# Patient Record
Sex: Male | Born: 1990 | Race: White | Hispanic: No | Marital: Single | Smoking: Current some day smoker
Health system: Southern US, Community
[De-identification: ages and names within clinical notes are randomized; demographics above are authoritative.]

## PROBLEM LIST (undated history)

## (undated) DIAGNOSIS — G894 Chronic pain syndrome: Secondary | ICD-10-CM

## (undated) DIAGNOSIS — F1110 Opioid abuse, uncomplicated: Secondary | ICD-10-CM

## (undated) DIAGNOSIS — M5410 Radiculopathy, site unspecified: Secondary | ICD-10-CM

## (undated) DIAGNOSIS — T148XXA Other injury of unspecified body region, initial encounter: Secondary | ICD-10-CM

## (undated) DIAGNOSIS — S14105A Unspecified injury at C5 level of cervical spinal cord, initial encounter: Secondary | ICD-10-CM

## (undated) HISTORY — DX: Radiculopathy, site unspecified: M54.10

## (undated) HISTORY — DX: Other injury of unspecified body region, initial encounter: T14.8XXA

## (undated) HISTORY — PX: OTHER SURGICAL HISTORY: SHX169

---
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
## 2009-03-14 ENCOUNTER — Emergency Department (HOSPITAL_COMMUNITY): Admission: EM | Admit: 2009-03-14 | Discharge: 2009-03-14 | Payer: Self-pay | Admitting: Emergency Medicine

## 2009-03-27 ENCOUNTER — Emergency Department (HOSPITAL_COMMUNITY): Admission: EM | Admit: 2009-03-27 | Discharge: 2009-03-27 | Payer: Self-pay | Admitting: Emergency Medicine

## 2009-03-28 ENCOUNTER — Ambulatory Visit (HOSPITAL_COMMUNITY): Admission: RE | Admit: 2009-03-28 | Discharge: 2009-03-28 | Payer: Self-pay | Admitting: Emergency Medicine

## 2009-04-28 ENCOUNTER — Emergency Department (HOSPITAL_COMMUNITY): Admission: EM | Admit: 2009-04-28 | Discharge: 2009-04-28 | Payer: Self-pay | Admitting: Emergency Medicine

## 2009-05-03 ENCOUNTER — Emergency Department (HOSPITAL_COMMUNITY): Admission: EM | Admit: 2009-05-03 | Discharge: 2009-05-03 | Payer: Self-pay | Admitting: Emergency Medicine

## 2009-05-05 ENCOUNTER — Emergency Department (HOSPITAL_COMMUNITY): Admission: EM | Admit: 2009-05-05 | Discharge: 2009-05-05 | Payer: Self-pay | Admitting: Emergency Medicine

## 2009-05-09 ENCOUNTER — Emergency Department (HOSPITAL_COMMUNITY): Admission: EM | Admit: 2009-05-09 | Discharge: 2009-05-09 | Payer: Self-pay | Admitting: Emergency Medicine

## 2009-06-05 DIAGNOSIS — S14105A Unspecified injury at C5 level of cervical spinal cord, initial encounter: Secondary | ICD-10-CM

## 2009-06-05 HISTORY — DX: Unspecified injury at C5 level of cervical spinal cord, initial encounter: S14.105A

## 2009-06-06 ENCOUNTER — Emergency Department (HOSPITAL_COMMUNITY): Admission: EM | Admit: 2009-06-06 | Discharge: 2009-06-06 | Payer: Self-pay | Admitting: Emergency Medicine

## 2009-06-12 ENCOUNTER — Emergency Department (HOSPITAL_COMMUNITY): Admission: EM | Admit: 2009-06-12 | Discharge: 2009-06-12 | Payer: Self-pay | Admitting: Emergency Medicine

## 2009-06-18 ENCOUNTER — Emergency Department (HOSPITAL_COMMUNITY): Admission: EM | Admit: 2009-06-18 | Discharge: 2009-06-18 | Payer: Self-pay | Admitting: Emergency Medicine

## 2009-06-19 ENCOUNTER — Emergency Department (HOSPITAL_COMMUNITY): Admission: EM | Admit: 2009-06-19 | Discharge: 2009-06-19 | Payer: Self-pay | Admitting: Emergency Medicine

## 2009-06-24 ENCOUNTER — Emergency Department (HOSPITAL_COMMUNITY): Admission: EM | Admit: 2009-06-24 | Discharge: 2009-06-24 | Payer: Self-pay | Admitting: Emergency Medicine

## 2009-07-25 ENCOUNTER — Emergency Department (HOSPITAL_COMMUNITY): Admission: EM | Admit: 2009-07-25 | Discharge: 2009-07-25 | Payer: Self-pay | Admitting: Emergency Medicine

## 2009-08-23 ENCOUNTER — Ambulatory Visit (HOSPITAL_COMMUNITY): Admission: RE | Admit: 2009-08-23 | Discharge: 2009-08-23 | Payer: Self-pay | Admitting: Neurosurgery

## 2009-09-13 ENCOUNTER — Ambulatory Visit: Payer: Self-pay | Admitting: Family Medicine

## 2009-09-13 DIAGNOSIS — Z87891 Personal history of nicotine dependence: Secondary | ICD-10-CM

## 2009-09-13 DIAGNOSIS — M5412 Radiculopathy, cervical region: Secondary | ICD-10-CM

## 2009-09-21 ENCOUNTER — Telehealth: Payer: Self-pay | Admitting: Sports Medicine

## 2009-09-22 ENCOUNTER — Ambulatory Visit: Payer: Self-pay | Admitting: Family Medicine

## 2009-10-12 ENCOUNTER — Ambulatory Visit: Payer: Self-pay | Admitting: Family Medicine

## 2009-10-12 ENCOUNTER — Encounter: Payer: Self-pay | Admitting: Sports Medicine

## 2009-10-12 DIAGNOSIS — T22019A Burn of unspecified degree of unspecified forearm, initial encounter: Secondary | ICD-10-CM | POA: Insufficient documentation

## 2010-03-09 ENCOUNTER — Encounter: Payer: Self-pay | Admitting: Sports Medicine

## 2010-03-17 ENCOUNTER — Telehealth: Payer: Self-pay | Admitting: Sports Medicine

## 2010-03-30 ENCOUNTER — Telehealth: Payer: Self-pay | Admitting: Sports Medicine

## 2010-05-09 ENCOUNTER — Telehealth: Payer: Self-pay | Admitting: Sports Medicine

## 2010-06-07 ENCOUNTER — Telehealth: Payer: Self-pay | Admitting: Sports Medicine

## 2010-06-26 ENCOUNTER — Encounter: Payer: Self-pay | Admitting: Orthopaedic Surgery

## 2010-07-05 NOTE — Progress Notes (Signed)
Summary: refill  Phone Note Refill Request Call back at Home Phone [phone] Message from:  Patient  would rather have the Tramadol -   Initial call taken by: De Nurse,  March 30, 2010 1:35 PM    New/Updated Medications: TRAMADOL HCL 50 MG TABS (TRAMADOL HCL) One tab by mouth q4-6h as needed for pain Prescriptions: TRAMADOL HCL 50 MG TABS (TRAMADOL HCL) One tab by mouth q4-6h as needed for pain  #60 x 0   Entered and Authorized by:   Rodney Langton MD   Signed by:   Rodney Langton MD on 03/30/2010   Method used:   [facility]        [facility] [address] [phone]* (retail)       [address]       [place], [place]  [address]       Ph: [phone] or [phone]       Fax: [phone]   RxID:   [id number]

## 2010-07-05 NOTE — Assessment & Plan Note (Signed)
Summary: NP/KH   Vital Signs:  Patient profile:   20 year old male Height:      70 inches Weight:      179.1 pounds BMI:     25.79 Temp:     98.1 degrees F oral Pulse rate:   68 / minute Pulse rhythm:   regular BP sitting:   118 / 74  (left arm) Cuff size:   regular  Vitals Entered By: Loralee Pacas CMA (September 13, 2009 9:39 AM)  Primary Care Provider:  Rodney Langton MD   History of Present Illness: New pt eval.  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  was in PT/OT, no benefit, now doing ROM exercises at home daily, taking hydrocodone/APAP for pain which is mildly effective.  Has appt in April 2011 with neurosurgeon at [facility] for nerve transplant.    Smoker:  10 cigs a day.  Wants to quit.    Habits & Providers  Alcohol-Tobacco-Diet     Tobacco Status: current     Tobacco Counseling: to quit use of tobacco products     Cigarette Packs/Day: 1.0     Year Started: 2008     Neurosurgeon: Cherrie Distance. Zachery Conch - [facility]  Current Medications (verified): 1)  Qc Calcium 600+d High Potency 600-400 Mg-Unit Tabs (Calcium Carbonate-Vitamin D) .... One Tab By Mouth Bid 2)  Hydrocodone-Acetaminophen 5-325 Mg Tabs (Hydrocodone-Acetaminophen) .... One Tab By Mouth Q4h As Needed For Pain 3)  Gabapentin 300 Mg Caps (Gabapentin) .... One Tab Daily On Day 1, Then One Tab Two Times A Day On Day 2, Then One Tab By Mouth Three Times A Day.  May Increase To Max of 3 Tabs Three Times A Day.  Allergies (verified): No Known Drug Allergies  Past History:  Past Medical History: Right C7/C8 nerve foot avulsion in 2010 playing football, R arm paralyzed and insensate.  (s/p PT/OT) Smoker.  Past Surgical History: Wrist surgery for fx.  Family History: Both parents alive and well.  Social History: Smoker since 2008, 10 sticks a day. No other drugsSmoking Status:  current Packs/Day:  1.0  Review of  Systems  The patient denies anorexia, fever, weight loss, weight gain, vision loss, decreased hearing, hoarseness, chest pain, syncope, dyspnea on exertion, peripheral edema, prolonged cough, headaches, hemoptysis, abdominal pain, melena, hematochezia, severe indigestion/heartburn, suspicious skin lesions, difficulty walking, unusual weight change, and enlarged lymph nodes.    Physical Exam  General:  Well-developed,well-nourished,in no acute distress; alert,appropriate and cooperative throughout examination Head:  Normocephalic and atraumatic without obvious abnormalities. No apparent alopecia or balding. Eyes:  No corneal or conjunctival inflammation noted. EOMI. Perrla.  Ears:  External ear exam shows no significant lesions or deformities.  Nose:  External nasal examination shows no deformity or inflammation. Mouth:  Oral mucosa and oropharynx without lesions or exudates.  Teeth in good repair. Neck:  No deformities, masses, or tenderness noted. Lungs:  Normal respiratory effort, chest expands symmetrically. Lungs are clear to auscultation, no crackles or wheezes. Heart:  Normal rate and regular rhythm. S1 and S2 normal without gallop, murmur, click, rub or other extra sounds. Abdomen:  Bowel sounds positive,abdomen soft and non-tender without masses, organomegaly or hernias noted. Msk:  R arm somewhat atrophied, no biceps reflex, 1+ brachioradialis reflex, no triceps reflex, strength 0/5, no sensation.  Other ext WNL. Pulses:  R and L carotid,radial,femoral,dorsalis pedis and posterior tibial pulses are full and equal bilaterally Neurologic:  Grossly normall with the exception of R arm. 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   Denervation osteopenia:  Will try Ca-Vit D two times a day Nerve pains:  Refilled norco, will start neurontin, pt  educated on how to increase dose. Cont ROM exercises. Stop flexeril  Problem # 2:  SMOKER (ICD-305.1) Assessment: New Cessation advice given, pt will stop before surgery, doesn't want any medication to help.  Complete Medication List: 1)  Qc Calcium 600+d High Potency 600-400 Mg-unit Tabs (Calcium carbonate-vitamin d) .... One tab by mouth bid 2)  Hydrocodone-acetaminophen 5-325 Mg Tabs (Hydrocodone-acetaminophen) .... One tab by mouth q4h as needed for pain 3)  Gabapentin 300 Mg Caps (Gabapentin) .... One tab daily on day 1, then one tab two times a day on day 2, then one tab by mouth three times a day.  may increase to max of 3 tabs three times a day.  Patient Instructions: 1)  Great to meet you today, 2)  STOP SMOKING!! 3)  Stop the flexeril. 4)  Refilled hydrocodone 5)  Take Calcium/vitamin D two times a day. 6)  Start neurontin, see dosing instructions on script. 7)  range of motion exercises daily 8)  Come back to see me after your visit with the neurosurgeon. 9)  -Dr. Karie Schwalbe. Prescriptions: GABAPENTIN 300 MG CAPS (GABAPENTIN) One tab daily on day 1, then one tab two times a day on day 2, then one tab by mouth three times a day.  May increase to max of 3 tabs three times a day.  #180 x 6   Entered and Authorized by:   Rodney Langton MD   Signed by:   Rodney Langton MD on 09/13/2009   Method used:   Print then Give to Patient   RxID:   [id number] HYDROCODONE-ACETAMINOPHEN 5-325 MG TABS (HYDROCODONE-ACETAMINOPHEN) One tab by mouth q4h as needed for pain  #40 x 0   Entered and Authorized by:   Rodney Langton MD   Signed by:   Rodney Langton MD on 09/13/2009   Method used:   Print then Give to Patient   RxID:   [id number] QC CALCIUM 600+D HIGH POTENCY 600-400 MG-UNIT TABS (CALCIUM CARBONATE-VITAMIN D) One tab by mouth BID  #60 x 6   Entered and Authorized by:   Rodney Langton MD   Signed by:   Rodney Langton MD on 09/13/2009   Method used:    Print then Give to Patient   RxID:   [id number] GABAPENTIN 300 MG CAPS (GABAPENTIN) One tab daily on day 1, then one tab two times a day on day 2, then one tab by mouth three times a day.  May increase to max of 3 tabs three times a day.  #180 x 6   Entered and Authorized by:   Rodney Langton MD   Signed by:   Rodney Langton MD on 09/13/2009   Method used:   Print then Give to Patient   RxID:   [id number] HYDROCODONE-ACETAMINOPHEN 5-325 MG TABS (HYDROCODONE-ACETAMINOPHEN) One tab by mouth q4h as needed for pain  #40 x 0   Entered and Authorized by:   Rodney Langton MD   Signed by:   Rodney Langton MD on 09/13/2009   Method used:   Print then Give to Patient   RxID:   [id number] QC CALCIUM 600+D HIGH POTENCY 600-400 MG-UNIT TABS (CALCIUM CARBONATE-VITAMIN  D) One tab by mouth BID  #60 x 6   Entered and Authorized by:   Rodney Langton MD   Signed by:   Rodney Langton MD on 09/13/2009   Method used:   Print then Give to Patient   RxID:   [id number]  Note, error with script, printed on non-security paper so reprinted on security paper and extra copy destroyed. Rodney Langton MD  September 13, 2009 10:32 AM

## 2010-07-05 NOTE — Assessment & Plan Note (Signed)
Summary: med change/[place]/T   Vital Signs:  Patient profile:   20 year old male Height:      70 inches Weight:      179.8 pounds Temp:     98 degrees F Pulse rate:   81 / minute BP sitting:   113 / 71  (left arm)  Vitals Entered By: Theresia Lo RN (September 22, 2009 8:48 AM) CC: discuss meds Is Patient Diabetic? No Pain Assessment Patient in pain? yes     Location: right arm and right low back Intensity: 8 Type: heaviness   Primary Care Provider:  Rodney Langton MD  CC:  discuss meds.  History of Present Illness: 20 yo here for continued neuropthic pain.  Nerve injury due to accident, saw neurosurgeon yesterday.  Patient schedule for nerve transplant June 8th, 2011.  One week ago was started on gabapentin for nerve pain.  Quickly titrated up to 300 mg three times a day.  Patient does not want to take medication anymore because of somnolence, changes in mood- more irritable, and too many pills.   brings empty hydrocodone-APAP bottle- took all meds and states it did not help much.  Says he has been taking lyrica given to him by neurosurgeon and it seems to be helping.  He is not sure of his doseage.  He has been on this for about a month.  Habits & Providers  Alcohol-Tobacco-Diet     Tobacco Status: current     Tobacco Counseling: to quit use of tobacco products     Cigarette Packs/Day: 0.5  Current Medications (verified): 1)  Qc Calcium 600+d High Potency 600-400 Mg-Unit Tabs (Calcium Carbonate-Vitamin D) .... One Tab By Mouth Bid 2)  Lyrica 100 Mg Caps (Pregabalin) .... Take One Tablet Three Times A Day For Nerve Pain  Allergies: No Known Drug Allergies PMH-FH-SH reviewed-no changes except otherwise noted  Social History: Packs/Day:  0.5  Review of Systems      See HPI MS:  Complains of joint pain and mid back pain. Neuro:  Complains of numbness.  Physical Exam  General:  Well-developed,well-nourished,in no acute distress; alert,appropriate and  cooperative throughout examination. Msk:  right arm in fabric sling.   Impression & Recommendations:  Problem # 1:  INJURY TO CERVICAL NERVE ROOT (ICD-953.0)  Discontinued hydrocodone/apap and gabapentin per patient preference.  WIll increase lyrica to maximum dose of 100 mg three times a day.  Patient scheduled for surgery.  WIll follow-up with PCP after surgery and will have records sent to office.  Follow-up sooner if needed.  Orders: [facility]- Est Level  3 ([address])  Complete Medication List: 1)  Qc Calcium 600+d High Potency 600-400 Mg-unit Tabs (Calcium carbonate-vitamin d) .... One tab by mouth bid 2)  Lyrica 100 Mg Caps (Pregabalin) .... Take one tablet three times a day for nerve pain  Patient Instructions: 1)  Today we will increase your dose of lyrica. 2)  Please make follow-up appt with Dr. Karie Schwalbe after surgery or sooner if needed. 3)  Please ask your neurosurgeon to send records of your surgery to our office. Prescriptions: LYRICA 100 MG CAPS (PREGABALIN) take one tablet three times a day for nerve pain  #90 x 1   Entered and Authorized by:   Delbert Harness MD   Signed by:   Delbert Harness MD on 09/22/2009   Method used:   Print then Give to Patient   RxID:   [id number]

## 2010-07-05 NOTE — Progress Notes (Signed)
Summary: meds  Phone Note Call from Patient Call back at Home Phone [phone]   Caller: Patient Summary of Call: pt states that his surgery is June 8th - neurontin is not working and would like to try something different. [facility] Initial call taken by: De Nurse,  September 21, 2009 1:49 PM  Follow-up for Phone Call        states the med is not helping. told him he will need to see a doctor since his is not going to be here for the next 2 weeks. he will see Dr. Earnest Bailey at 8:30am Wed Follow-up by: Golden Circle RN,  September 21, 2009 1:54 PM  Additional Follow-up for Phone Call Additional follow up Details #1::        Noted, to see dr briscoe. Additional Follow-up by: Rodney Langton MD,  September 21, 2009 9:26 PM     Appended Document: meds Also to Dr. Earnest Bailey, would ensure he is taking neurontin as rx'ed and is up to the max dose daily 3600mg . If not would advise to continue increasing until gets there.  If taking max dose could consider amitryptiline vs lyrica.

## 2010-07-05 NOTE — Progress Notes (Signed)
Summary: refill  Phone Note Refill Request Call back at Work Phone [phone] Message from:  Patient  Refills Requested: Medication #1:  TRAMADOL HCL 50 MG TABS One tab by mouth q4-6h as needed for pain. Initial call taken by: De Nurse,  May 09, 2010 12:29 PM    Prescriptions: TRAMADOL HCL 50 MG TABS (TRAMADOL HCL) One tab by mouth q4-6h as needed for pain  #60 x 3   Entered and Authorized by:   Rodney Langton MD   Signed by:   Rodney Langton MD on 05/09/2010   Method used:   [facility]ically [facility]        [facility] [address] [phone]* (retail)       [address]       [place], [place]  [address]       Ph: [phone] or [phone]       Fax: [phone]   RxID:   [id number]
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
## 2010-07-05 NOTE — Progress Notes (Signed)
Summary: phn msg  Phone Note Call from Patient Call back at Work Phone [phone]   Caller: Patient Summary of Call: Tramadol helps but wants to know if there is a higher dose?  taking too many at a time [facility] Initial call taken by: De Nurse,  March 17, 2010 1:41 PM  Follow-up for Phone Call        I can increase to ultracet, little more expensive but stronger. Follow-up by: Rodney Langton MD,  March 17, 2010 2:34 PM    New/Updated Medications: ULTRACET 37.5-325 MG TABS (TRAMADOL-ACETAMINOPHEN) One to two tabs by mouth q8h as needed for pain. Prescriptions: ULTRACET 37.5-325 MG TABS (TRAMADOL-ACETAMINOPHEN) One to two tabs by mouth q8h as needed for pain.  #60 x 0   Entered and Authorized by:   Rodney Langton MD   Signed by:   Rodney Langton MD on 03/17/2010   Method used:   Electronically to        [facility] [address] [phone]* (retail)       [address]       [place], [place]  [address]       Ph: [phone] or [phone]       Fax: [phone]   RxID:   [id number]  called pt and left VM to let pt know. De Nurse  March 21, 2010 4:34 PM

## 2010-07-07 NOTE — Progress Notes (Signed)
  Phone Note Refill Request   Refills Requested: Medication #1:  TRAMADOL HCL 50 MG TABS One tab by mouth q4-6h as needed for pain. Initial call taken by: Abundio Miu,  June 07, 2010 2:12 PM    Prescriptions: TRAMADOL HCL 50 MG TABS (TRAMADOL HCL) One tab by mouth q4-6h as needed for pain  #60 x 3   Entered and Authorized by:   Rodney Langton MD   Signed by:   Rodney Langton MD on 06/07/2010   Method used:   Electronically to        [facility] [address] [phone]* (retail)       [address]       [place], [place]  [address]       Ph: [phone] or [phone]       Fax: [phone]   RxID:   [id number]

## 2010-07-09 ENCOUNTER — Emergency Department (HOSPITAL_COMMUNITY)
Admission: EM | Admit: 2010-07-09 | Discharge: 2010-07-10 | Disposition: A | Discharge status: Home / Self Care | Payer: Medicaid Other | Attending: Emergency Medicine | Admitting: Emergency Medicine

## 2010-07-09 DIAGNOSIS — W540XXA Bitten by dog, initial encounter: Secondary | ICD-10-CM | POA: Insufficient documentation

## 2010-07-09 DIAGNOSIS — Z203 Contact with and (suspected) exposure to rabies: Secondary | ICD-10-CM | POA: Insufficient documentation

## 2010-07-09 DIAGNOSIS — S51809A Unspecified open wound of unspecified forearm, initial encounter: Secondary | ICD-10-CM | POA: Insufficient documentation

## 2010-07-09 DIAGNOSIS — Z23 Encounter for immunization: Secondary | ICD-10-CM | POA: Insufficient documentation

## 2010-07-12 ENCOUNTER — Encounter: Payer: Self-pay | Admitting: Sports Medicine
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
## 2010-09-05 ENCOUNTER — Ambulatory Visit (INDEPENDENT_AMBULATORY_CARE_PROVIDER_SITE_OTHER): Payer: Medicaid Other | Admitting: Sports Medicine

## 2010-09-05 ENCOUNTER — Encounter: Payer: Self-pay | Admitting: Sports Medicine

## 2010-09-05 VITALS — BP 157/86 | HR 111 | Temp 98.2°F | Ht 71.0 in | Wt 182.5 lb

## 2010-09-05 DIAGNOSIS — S142XXA Injury of nerve root of cervical spine, initial encounter: Secondary | ICD-10-CM

## 2010-09-05 DIAGNOSIS — R030 Elevated blood-pressure reading, without diagnosis of hypertension: Secondary | ICD-10-CM

## 2010-09-05 MED ORDER — OXYCODONE HCL 10 MG PO TB12: 10.0000 mg | ORAL_TABLET | Freq: Two times a day (BID) | ORAL | Status: DC

## 2010-09-05 MED ORDER — OXYCODONE HCL 5 MG PO TABS: 5.0000 mg | ORAL_TABLET | ORAL | Status: DC | PRN

## 2010-09-05 MED ORDER — GABAPENTIN 300 MG PO CAPS: ORAL_CAPSULE | ORAL | Status: DC

## 2010-09-05 NOTE — Patient Instructions (Signed)
Great to see you, Changing your oxycodone to oxycontin 10mg  2x a day. Will add oxycodone 5 to be used only for breakthrough pain. Re-adding gabapentin, use as directed. Come back to see me in a month.  Ihor Austin. Benjamin Stain, M.D.

## 2010-09-05 NOTE — Progress Notes (Signed)
  Subjective:    Patient ID: Jeffery Baldwin, male    DOB: 04/06/1991, 20 y.o.   MRN: [id number]  HPI Pt returns after his nerve root avulsion, s/p nerve root repair.  He is gaining some sensation back but has no movement of his arm.  He still has considerable pain.  Has been going to [facility] but doesn't like it there. Current pain meds include:  Oxycodone 5, 5x a day.  No longer taking gabapentin or lyrica.   Review of Systems    See HPI Objective:   Physical Exam  Constitutional: He appears well-developed and well-nourished. No distress.  Cardiovascular: Normal rate, regular rhythm and normal heart sounds.  Exam reveals no gallop and no friction rub.   No murmur heard. Pulmonary/Chest: Effort normal. No respiratory distress. He has no wheezes. He has no rales. He exhibits no tenderness.  Neurological:       R arm paralyzed. No movement at all. Minimal but non-consistent sensation reported in C5/C6 distribution, completely insensate  C6-T1.  In sling.  Warm, dry.  Skin: Skin is warm and dry.          Assessment & Plan:

## 2010-09-05 NOTE — Progress Notes (Signed)
Addended by: Rodney Langton on: 09/05/2010 04:34 PM   Modules accepted: Orders

## 2010-09-05 NOTE — Assessment & Plan Note (Addendum)
S/p nerve root repair. Still with pain. Oxycodone IR 5mg  5x a day, will change to Oxycontin 10 BID w/breakthrough oxycodone. Adding Gabapentin TID. RTC 1 month to reassess.  Marland Kitchen...pt unable to afford oxycontin, will change to oxy IR, #120 tabs.  He got N/V to [facility].  Will have him put in for prior auth, which I can fill out.

## 2010-09-05 NOTE — Assessment & Plan Note (Signed)
Noted that he was in considerable pain today. Will recheck and if cont to be high would need to treat.

## 2010-09-06 ENCOUNTER — Telehealth: Payer: Self-pay | Admitting: Sports Medicine

## 2010-09-06 NOTE — Telephone Encounter (Signed)
Pt is leaving town today and states that the pharmacy could not fill script for Oxy 5mg , they said they would be able to fill the 10mg  - needs another script for 10mg  but for lesser amount (#30)  Not sure what to do since Dr T is not here today.

## 2010-09-06 NOTE — Telephone Encounter (Signed)
LM. If he calls, tell him to bring old RX before md will write a new one

## 2010-09-06 NOTE — Telephone Encounter (Signed)
He can come by and [facility] HIS RX for the 5 mg and I will changeit accordingly THANKS! Denny Levy

## 2010-09-06 NOTE — Telephone Encounter (Signed)
Sent to preceptor to advise

## 2010-09-07 ENCOUNTER — Other Ambulatory Visit: Payer: Self-pay | Admitting: Family Medicine

## 2010-09-07 MED ORDER — OXYCODONE HCL 10 MG PO TABS: 10.0000 mg | ORAL_TABLET | Freq: Two times a day (BID) | ORAL | Status: DC | PRN

## 2010-09-07 NOTE — Telephone Encounter (Signed)
Addended byDenny Levy on: 09/07/2010 03:53 PM   Modules accepted: Orders

## 2010-09-07 NOTE — Telephone Encounter (Signed)
I have his original rx and will destroy New rx written for 10 mg tabs, one po bid #60 no refills. F/u Dr Karie Schwalbe

## 2010-10-07 ENCOUNTER — Encounter: Payer: Self-pay | Admitting: Sports Medicine

## 2010-10-07 ENCOUNTER — Ambulatory Visit (INDEPENDENT_AMBULATORY_CARE_PROVIDER_SITE_OTHER): Payer: Medicaid Other | Admitting: Sports Medicine

## 2010-10-07 VITALS — BP 132/78 | HR 82 | Temp 98.1°F | Ht 70.0 in | Wt 183.4 lb

## 2010-10-07 DIAGNOSIS — M5410 Radiculopathy, site unspecified: Secondary | ICD-10-CM

## 2010-10-07 DIAGNOSIS — T148XXA Other injury of unspecified body region, initial encounter: Secondary | ICD-10-CM

## 2010-10-07 MED ORDER — OXYCODONE HCL 20 MG PO TABS: 1.0000 | ORAL_TABLET | Freq: Four times a day (QID) | ORAL | Status: DC | PRN

## 2010-10-07 MED ORDER — GABAPENTIN 600 MG PO TABS: 1200.0000 mg | ORAL_TABLET | Freq: Three times a day (TID) | ORAL | Status: DC

## 2010-10-07 NOTE — Patient Instructions (Signed)
Great to see you,  Increasing oxycodone to 20mg  4x a day. Increase neurontin to 2 tabs 3x a day (1200mg  3x a day). Come back to see me in a month to see how your symptoms are doing. The goal is to find a dose of narcotic that works for  [place], then convert to long acting forms (oxycontin).  Ihor Austin. Benjamin Stain, M.D.

## 2010-10-07 NOTE — Progress Notes (Signed)
  Subjective:    Patient ID: Jeffery Baldwin, male    DOB: September 17, 1990, 20 y.o.   MRN: [id number]  HPI R arm nerve root avulsion:  Still with pain, sometimes shooting, on gabapentin 600 BID, oxycodone 10 QID.  Pain relief not optimal and this keeps him from being fully functional.   Review of Systems See HPI    Objective:   Physical Exam  Constitutional: He appears well-developed and well-nourished. No distress.  Skin: Skin is warm and dry.          Assessment & Plan:

## 2010-10-07 NOTE — Assessment & Plan Note (Signed)
Increase oxycodone to 20mg  PO QID prn. Increase neurontin to 1200mg  TID. RTC 1 month. Goal is to find an appropriate narcotic dose, then change to LA form.

## 2010-11-01 ENCOUNTER — Ambulatory Visit (INDEPENDENT_AMBULATORY_CARE_PROVIDER_SITE_OTHER): Payer: Medicaid Other | Admitting: Sports Medicine

## 2010-11-01 ENCOUNTER — Encounter: Payer: Self-pay | Admitting: Sports Medicine

## 2010-11-01 VITALS — BP 136/85 | HR 91 | Ht 70.0 in | Wt 183.0 lb

## 2010-11-01 DIAGNOSIS — T148XXA Other injury of unspecified body region, initial encounter: Secondary | ICD-10-CM

## 2010-11-01 DIAGNOSIS — M5410 Radiculopathy, site unspecified: Secondary | ICD-10-CM

## 2010-11-01 MED ORDER — OXYCODONE HCL 30 MG PO TABS: 30.0000 mg | ORAL_TABLET | Freq: Four times a day (QID) | ORAL | Status: DC | PRN

## 2010-11-01 NOTE — Assessment & Plan Note (Signed)
Still with pain. No constipation. Will increase oxycodone to 30mg . Pt desires referral to local neurosurgeon.

## 2010-11-01 NOTE — Progress Notes (Signed)
  Subjective:    Patient ID: Jeffery Baldwin, male    DOB: [birth year], 20 y.o.   MRN: [id number]  HPI R arm nerve root avulsion:  Still with pain, sometimes shooting, on gabapentin 600 BID, oxycodone 20 QID.  Pain relief not optimal and this keeps him from being fully functional.  No constipation, stools q1-2 days. Also would like referral to local neurosurgeon for further eval.   Review of Systems    See HPI Objective:   Physical Exam    Neurological:  R arm paralyzed. No movement at all. Minimal but non-consistent sensation reported in C5/C6 distribution, completely insensate  C6-T1.  In sling.  Warm, dry.   Assessment & Plan:

## 2010-11-01 NOTE — Patient Instructions (Addendum)
Referral to neurosurgery, Dr. Alphonzo Lemmings. We will call you with appt. Refilled oxycodone. Come back to see [place] as needed.  Jeffery Baldwin. Benjamin Stain, M.D.

## 2010-11-08 ENCOUNTER — Ambulatory Visit: Payer: Medicaid Other | Admitting: Sports Medicine

## 2010-11-15 ENCOUNTER — Other Ambulatory Visit: Payer: Self-pay | Admitting: Sports Medicine

## 2010-11-15 ENCOUNTER — Encounter: Payer: Self-pay | Admitting: Sports Medicine

## 2010-11-15 DIAGNOSIS — T148XXA Other injury of unspecified body region, initial encounter: Secondary | ICD-10-CM

## 2010-11-15 DIAGNOSIS — M5410 Radiculopathy, site unspecified: Secondary | ICD-10-CM

## 2010-11-15 MED ORDER — OXYCODONE HCL 30 MG PO TABS: 30.0000 mg | ORAL_TABLET | Freq: Four times a day (QID) | ORAL | Status: DC | PRN

## 2010-11-25 ENCOUNTER — Telehealth: Payer: Self-pay | Admitting: Sports Medicine

## 2010-11-25 NOTE — Telephone Encounter (Signed)
Called and lvm to find out if pt has been scheduled for an appt at [facility].  I left my name and number and the pt's name and DOB for reference.Laureen Ochs, Viann Shove

## 2010-11-25 NOTE — Telephone Encounter (Signed)
Called to say pt has an appointment on 12/06/10 with Dr Franky Macho

## 2010-11-28 NOTE — Telephone Encounter (Signed)
Pt has appt with [facility] 7.3.2012 @ 345 pm..Jeffery Baldwin, Jeffery Baldwin

## 2010-11-29 ENCOUNTER — Ambulatory Visit (INDEPENDENT_AMBULATORY_CARE_PROVIDER_SITE_OTHER): Payer: Medicaid Other | Admitting: Sports Medicine

## 2010-11-29 ENCOUNTER — Encounter: Payer: Self-pay | Admitting: Sports Medicine

## 2010-11-29 VITALS — BP 131/79 | HR 84 | Temp 97.5°F | Ht 71.0 in | Wt 187.0 lb

## 2010-11-29 DIAGNOSIS — T148XXA Other injury of unspecified body region, initial encounter: Secondary | ICD-10-CM

## 2010-11-29 DIAGNOSIS — M5410 Radiculopathy, site unspecified: Secondary | ICD-10-CM

## 2010-11-29 MED ORDER — OXYCODONE HCL 30 MG PO TABS: 30.0000 mg | ORAL_TABLET | Freq: Four times a day (QID) | ORAL | Status: DC | PRN

## 2010-11-29 NOTE — Assessment & Plan Note (Addendum)
Refilling oxycodone. Cont gabapentin. Pt with f/u with Neurosurgery.

## 2010-11-29 NOTE — Progress Notes (Signed)
  Subjective:    Patient ID: Jeffery Baldwin, male    DOB: 05-Jun-1991, 20 y.o.   MRN: [id number]  HPI Nerve root avulsion:  Symptoms improved with gabapentin 1200 TID.  Oxy 30 q6 working. Has appt with Dr. Franky Macho neurosurgery in early July.  Review of Systems    See HPI Objective:   Physical Exam R arm paralyzed. No movement at all. Minimal but non-consistent sensation reported in C5/C6 distribution, completely insensate  C6-T1.  In sling.  Warm, dry    Assessment & Plan:

## 2010-12-27 ENCOUNTER — Encounter: Payer: Self-pay | Admitting: Sports Medicine

## 2010-12-27 ENCOUNTER — Ambulatory Visit (INDEPENDENT_AMBULATORY_CARE_PROVIDER_SITE_OTHER): Payer: Medicaid Other | Admitting: Sports Medicine

## 2010-12-27 VITALS — BP 130/78 | HR 81

## 2010-12-27 DIAGNOSIS — M5410 Radiculopathy, site unspecified: Secondary | ICD-10-CM

## 2010-12-27 DIAGNOSIS — T148XXA Other injury of unspecified body region, initial encounter: Secondary | ICD-10-CM

## 2010-12-27 MED ORDER — GABAPENTIN 600 MG PO TABS: ORAL_TABLET | ORAL | Status: DC

## 2010-12-27 MED ORDER — OXYCODONE HCL 30 MG PO TABS: 30.0000 mg | ORAL_TABLET | Freq: Four times a day (QID) | ORAL | Status: DC | PRN

## 2010-12-27 NOTE — Patient Instructions (Signed)
Great to see you today, I will refill your oxycodone and your gabapentin for one month. I will also set you up with formal physical therapy to work on range of motion and e-stim. You should keep your appointment with Dr. Franky Macho. You will also need to set up an appointment with Dr. Rosalita Levan, your new primary care provider at the family practice center. Stay in school, and come back to see [place] on an as-needed basis. Dr. Rivka Safer will further refill your gabapentin and oxycodone.  Jeffery Baldwin. Benjamin Stain, M.D. [name] [address] [place] [address] [phone]

## 2010-12-27 NOTE — Assessment & Plan Note (Signed)
We'll refill his oxycodone and gabapentin today. He understands that further refills should be done by his primary care provider at the family practice [facility]. I will also get him into formal physical therapy to work on range of motion as well as e-stim. He missed his initial appointment with his neurosurgeon here [facility] however has another appointment set up for early August.

## 2010-12-27 NOTE — Progress Notes (Signed)
  Subjective:    Patient ID: Jeffery Baldwin, male    DOB: 17-Dec-1990, 20 y.o.   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  He has since had I nerve graft performed at [facility] neurosurgery. He did not yet develop any benefit from this. He is also not yet been to physical therapy though her referral was placed last year. His pain has been controlled with gabapentin 600 mg 3 times a day, as well as oxycodone 30 mg by mouth every 6 hours. We've been refilling this at the family practice Center monthly and he has not asked for early refills. He has not yet met his new primary care provider Dr. Rosalita Baldwin at family practice.  He maintains a good support system and is currently at [employer] [employer] studying substance abuse. He lives with his father in a house and has a girlfriend as well. His mood is overall good. He is currently looking into stem cell research opportunities for his nerve injury.  Review of Systems Thoroughly reviewed and negative except as noted above in the history of present illness.    Objective:   Physical Exam General: Well-developed, well-nourished Caucasian male who appears stated age.   R arm paralyzed and in a sling. No movement at all. Minimal but non-consistent sensation reported in C5/C6 distribution, completely insensate  C6-T1.  Warm, dry  Assessment & Plan:

## 2010-12-31 ENCOUNTER — Encounter: Payer: Self-pay | Admitting: Family Medicine

## 2011-01-23 ENCOUNTER — Ambulatory Visit (INDEPENDENT_AMBULATORY_CARE_PROVIDER_SITE_OTHER): Payer: Medicaid Other | Admitting: Family Medicine

## 2011-01-23 ENCOUNTER — Encounter: Payer: Self-pay | Admitting: Family Medicine

## 2011-01-23 VITALS — BP 133/86 | HR 86 | Ht 71.0 in | Wt 192.0 lb

## 2011-01-23 DIAGNOSIS — G8929 Other chronic pain: Secondary | ICD-10-CM

## 2011-01-23 DIAGNOSIS — M25519 Pain in unspecified shoulder: Secondary | ICD-10-CM

## 2011-01-23 DIAGNOSIS — Z720 Tobacco use: Secondary | ICD-10-CM

## 2011-01-23 DIAGNOSIS — F17200 Nicotine dependence, unspecified, uncomplicated: Secondary | ICD-10-CM

## 2011-01-23 DIAGNOSIS — G4700 Insomnia, unspecified: Secondary | ICD-10-CM

## 2011-01-23 DIAGNOSIS — T148XXA Other injury of unspecified body region, initial encounter: Secondary | ICD-10-CM

## 2011-01-23 DIAGNOSIS — M5410 Radiculopathy, site unspecified: Secondary | ICD-10-CM

## 2011-01-23 MED ORDER — VARENICLINE TARTRATE 0.5 MG X 11 & 1 MG X 42 PO MISC: ORAL | Status: DC

## 2011-01-23 MED ORDER — OXYCODONE HCL 30 MG PO TABS: 30.0000 mg | ORAL_TABLET | Freq: Four times a day (QID) | ORAL | Status: DC | PRN

## 2011-01-23 MED ORDER — VARENICLINE TARTRATE 1 MG PO TABS: 1.0000 mg | ORAL_TABLET | Freq: Two times a day (BID) | ORAL | Status: DC

## 2011-01-23 MED ORDER — MIRTAZAPINE 30 MG PO TBDP: 30.0000 mg | ORAL_TABLET | Freq: Every day | ORAL | Status: AC

## 2011-01-23 NOTE — Progress Notes (Signed)
  Subjective:    Patient ID: Jeffery Baldwin, male    DOB: February 02, 1991, 20 y.o.   MRN: [id number]  HPI 1. Chronic Pain Patient has c5,6,7 nerve root avulsions right side. He has chronic pain from this and from past surgery. He has been seen at sports medicine and has MRI documentation. Patient has been very compliant with his pain medication. He has no history of abuse or family history of abuse.  Gabapentin has not been working for him and he has noticed hot flashes associated with this medication.  2. Insomnia Difficulty sleeping at night because of his right shoulder pain from his nerve root football injury. He has tried [place], but it is too strong.   3. Tobacco abuse counseled pt. For 25 minutes. Pt. 8/10 ready to quit. He has picked Friday as a quit date. He was advised of the health risks of smoking. He has 7 pack years, pack per day.   Review of Systems  Constitutional: Negative for fever, activity change, appetite change and fatigue.  HENT: Positive for neck pain. Negative for neck stiffness.   Musculoskeletal: Positive for back pain and arthralgias. Negative for myalgias and joint swelling.  Neurological: Negative for headaches.  Psychiatric/Behavioral: Positive for sleep disturbance. Negative for behavioral problems and agitation.       Objective:   Physical Exam  Constitutional: He appears well-developed and well-nourished. No distress.  Neurological: No cranial nerve deficit.       Right arm palsy/sensory deficits.  Skin: No rash noted. He is not diaphoretic.      Assessment & Plan:  1. Chronic Pain - Roxicodone 30mg  q 6 hours. Two month supply. Good history with Dr. Karie Schwalbe of compliance. - stop neurontin - may start amitriptyline, but will wait till next visit.  2. Insomnia - related to pain. - Remeron.  3. Tobacco abuse -quit date: Friday. - Starting Chantix 0.5 mg start pack. - counseling provided.

## 2011-01-27 ENCOUNTER — Telehealth: Payer: Self-pay | Admitting: Family Medicine

## 2011-01-27 NOTE — Telephone Encounter (Signed)
Prescriptions for Chantix and pain medicine flew out the window.  He would like new prescriptions.  Any questions, please call him.

## 2011-01-27 NOTE — Telephone Encounter (Signed)
Per PCP rx will not be written again

## 2011-01-31 ENCOUNTER — Ambulatory Visit: Payer: Medicaid Other | Admitting: Sports Medicine

## 2011-02-07 ENCOUNTER — Other Ambulatory Visit: Payer: Self-pay | Admitting: Family Medicine

## 2011-02-07 MED ORDER — VARENICLINE TARTRATE 1 MG PO TABS: 1.0000 mg | ORAL_TABLET | Freq: Two times a day (BID) | ORAL | Status: DC

## 2011-02-07 MED ORDER — VARENICLINE TARTRATE 0.5 MG X 11 & 1 MG X 42 PO MISC: ORAL | Status: DC

## 2011-02-07 NOTE — Telephone Encounter (Signed)
Patient stated his roxicodone script flew out of the car window as well as his Chantix script. I refilled his chantix electronically. Discussed with Dennison Nancy, will not refill roxicodone at this time. I would like to refer him to a pain management clinic as this will be a long term pain control issue.

## 2011-02-08 ENCOUNTER — Other Ambulatory Visit: Payer: Self-pay | Admitting: Family Medicine

## 2011-02-08 MED ORDER — VARENICLINE TARTRATE 0.5 MG X 11 & 1 MG X 42 PO MISC: ORAL | Status: DC

## 2011-02-15 ENCOUNTER — Other Ambulatory Visit: Payer: Self-pay | Admitting: Family Medicine

## 2011-02-15 DIAGNOSIS — M5410 Radiculopathy, site unspecified: Secondary | ICD-10-CM

## 2011-02-17 ENCOUNTER — Encounter: Payer: Self-pay | Admitting: Family Medicine

## 2011-02-17 ENCOUNTER — Ambulatory Visit (INDEPENDENT_AMBULATORY_CARE_PROVIDER_SITE_OTHER): Payer: Medicaid Other | Admitting: Family Medicine

## 2011-02-17 VITALS — BP 137/82 | HR 99 | Temp 97.9°F | Ht 71.0 in | Wt 189.0 lb

## 2011-02-17 DIAGNOSIS — M5410 Radiculopathy, site unspecified: Secondary | ICD-10-CM

## 2011-02-17 DIAGNOSIS — Z23 Encounter for immunization: Secondary | ICD-10-CM

## 2011-02-17 DIAGNOSIS — T148XXA Other injury of unspecified body region, initial encounter: Secondary | ICD-10-CM

## 2011-02-17 DIAGNOSIS — G8929 Other chronic pain: Secondary | ICD-10-CM

## 2011-02-17 MED ORDER — OXYCODONE HCL 30 MG PO TABS: 30.0000 mg | ORAL_TABLET | Freq: Four times a day (QID) | ORAL | Status: DC | PRN

## 2011-02-17 NOTE — Progress Notes (Signed)
Addended by: Jimmy Footman K on: 02/17/2011 03:50 PM   Modules accepted: Orders

## 2011-02-17 NOTE — Progress Notes (Signed)
  Subjective:    Patient ID: Jeffery Baldwin, male    DOB: 11-06-1990, 20 y.o.   MRN: [id number]  HPI 1. Chronic right shoulder pain Patient is here for a refill of his narcotic medication. He is 6 days early. His last prescription flew out the window. I did not refill it from that occurrence. He is doing well on the current pain regimen. He denies alcohol or drug abuse. He has a pain contract. He denies seeing other providers for prescriptions.   Review of Systems  Musculoskeletal:       Pain in right shoulder  Psychiatric/Behavioral: Negative for behavioral problems, sleep disturbance, self-injury, dysphoric mood and agitation. The patient is not nervous/anxious.        Objective:   Physical Exam  Nursing note and vitals reviewed. Constitutional: He appears well-developed and well-nourished. No distress.  Musculoskeletal: He exhibits tenderness.       Right arm in a sling  Skin: He is not diaphoretic.      Assessment & Plan:  1. Chronic pain right shoulder - nerve root avulsion - failed surgical repair - UDS today - will refill his oxycodone, but counseled him on not coming early. His next refill is for Oct. 20th. He is going out of town and needs script before he leaves. (pheonix for three weeks) - advised on substance abuse, narcotic dependence and tolerance Has tried oxycontin and neurontin in past - no pain relief.  2. Tobacco abuse Smoking more recently Has not started chantix. Desire to quit seems less Advised him on health risks.

## 2011-02-17 NOTE — Progress Notes (Signed)
Addended by: Herminio Heads on: 02/17/2011 11:57 AM   Modules accepted: Orders

## 2011-02-18 LAB — DRUG SCREEN, URINE
Amphetamine Screen, Ur: NEGATIVE
Benzodiazepines.: NEGATIVE
Cocaine Metabolites: NEGATIVE
Marijuana Metabolite: NEGATIVE
Opiates: NEGATIVE
Phencyclidine (PCP): NEGATIVE

## 2011-02-23 ENCOUNTER — Encounter: Payer: Self-pay | Admitting: Family Medicine

## 2011-02-23 NOTE — Telephone Encounter (Signed)
Encounter opened in error

## 2011-03-09 ENCOUNTER — Encounter: Payer: Self-pay | Admitting: Family Medicine

## 2011-03-09 NOTE — Telephone Encounter (Signed)
Opened in error

## 2011-03-20 ENCOUNTER — Ambulatory Visit: Payer: Medicaid Other | Admitting: Family Medicine

## 2011-03-21 ENCOUNTER — Ambulatory Visit (INDEPENDENT_AMBULATORY_CARE_PROVIDER_SITE_OTHER): Payer: Medicaid Other | Admitting: Sports Medicine

## 2011-03-21 ENCOUNTER — Encounter: Payer: Self-pay | Admitting: Sports Medicine

## 2011-03-21 VITALS — BP 120/78

## 2011-03-21 DIAGNOSIS — T148XXA Other injury of unspecified body region, initial encounter: Secondary | ICD-10-CM

## 2011-03-21 DIAGNOSIS — M5410 Radiculopathy, site unspecified: Secondary | ICD-10-CM
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
## 2011-03-24 ENCOUNTER — Ambulatory Visit (INDEPENDENT_AMBULATORY_CARE_PROVIDER_SITE_OTHER): Payer: Medicaid Other | Admitting: Family Medicine

## 2011-03-24 ENCOUNTER — Encounter: Payer: Self-pay | Admitting: Family Medicine

## 2011-03-24 VITALS — BP 151/80 | HR 90 | Temp 97.7°F | Ht 71.0 in | Wt 195.2 lb

## 2011-03-24 DIAGNOSIS — G4700 Insomnia, unspecified: Secondary | ICD-10-CM

## 2011-03-24 DIAGNOSIS — T148XXA Other injury of unspecified body region, initial encounter: Secondary | ICD-10-CM

## 2011-03-24 DIAGNOSIS — G8929 Other chronic pain: Secondary | ICD-10-CM

## 2011-03-24 DIAGNOSIS — M5410 Radiculopathy, site unspecified: Secondary | ICD-10-CM

## 2011-03-24 MED ORDER — VARENICLINE TARTRATE 1 MG PO TABS: 1.0000 mg | ORAL_TABLET | Freq: Two times a day (BID) | ORAL | Status: DC

## 2011-03-24 MED ORDER — OXYCODONE HCL 30 MG PO TABS: 30.0000 mg | ORAL_TABLET | Freq: Four times a day (QID) | ORAL | Status: DC | PRN

## 2011-03-24 MED ORDER — TRAZODONE HCL 50 MG PO TABS: 50.0000 mg | ORAL_TABLET | Freq: Every day | ORAL | Status: DC

## 2011-03-24 NOTE — Progress Notes (Signed)
  Subjective:    Patient ID: Jeffery Baldwin, male    DOB: [birth year], 20 y.o.   MRN: [id number]  HPI 1. Chronic right shoulder pain Patient is here for a refill of his narcotic medication. He is 6 days early. His last prescription flew out the window. I did not refill it from that occurrence. He is doing well on the current pain regimen. He denies alcohol or drug abuse. He has a pain contract. He denies seeing other providers for prescriptions.   Review of Systems  Musculoskeletal:       Pain in right shoulder  Psychiatric/Behavioral: Negative for behavioral problems, sleep disturbance, self-injury, dysphoric mood and agitation. The patient is not nervous/anxious.        Objective:   Physical Exam  Nursing note and vitals reviewed. Constitutional: He appears well-developed and well-nourished. No distress.  Musculoskeletal: He exhibits tenderness.       Right arm in a sling  Skin: He is not diaphoretic.      Assessment & Plan:  1. Chronic pain right shoulder - nerve root avulsion - failed surgical repair - recently saw Dr. Karie Schwalbe at sports med. - planning on seeing another neurosurgeon. -refilled his oxycodone - next refill 04/23/11  2. Tobacco abuse - cut down to 3 cigarettes a day using chantix. -counseled for 20 minutes on smoking cessation - chantix refill - some nausea.

## 2011-03-24 NOTE — Progress Notes (Deleted)
  Subjective:    Patient ID: Jeffery Baldwin, male    DOB: 12/30/1990, 20 y.o.   MRN: [id number]  HPI    Review of Systems     Objective:   Physical Exam        Assessment & Plan:   

## 2011-03-24 NOTE — Patient Instructions (Signed)
Next refill on Nov. 18th. 2012

## 2011-04-21 ENCOUNTER — Ambulatory Visit (INDEPENDENT_AMBULATORY_CARE_PROVIDER_SITE_OTHER): Payer: Medicaid Other | Admitting: Family Medicine

## 2011-04-21 ENCOUNTER — Encounter: Payer: Self-pay | Admitting: Family Medicine

## 2011-04-21 DIAGNOSIS — G4700 Insomnia, unspecified: Secondary | ICD-10-CM

## 2011-04-21 DIAGNOSIS — T148XXA Other injury of unspecified body region, initial encounter: Secondary | ICD-10-CM

## 2011-04-21 DIAGNOSIS — G8929 Other chronic pain: Secondary | ICD-10-CM

## 2011-04-21 DIAGNOSIS — M5410 Radiculopathy, site unspecified: Secondary | ICD-10-CM

## 2011-04-21 MED ORDER — OXYCODONE HCL 30 MG PO TABS: 30.0000 mg | ORAL_TABLET | Freq: Four times a day (QID) | ORAL | Status: DC | PRN

## 2011-04-21 NOTE — Patient Instructions (Signed)
It was great to see you today!  Schedule an appointment to see me in one month.  Great work on your smoking cessation. Keep cutting back.
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
## 2011-04-21 NOTE — Progress Notes (Deleted)
  Subjective:    Patient ID: Jeffery Baldwin, male    DOB: 10/27/1990, 20 y.o.   MRN: [id number]  HPI    Review of Systems     Objective:   Physical Exam        Assessment & Plan:   

## 2011-04-21 NOTE — Progress Notes (Deleted)
  Subjective:    Patient ID: Jeffery Baldwin, male    DOB: 1990/11/09, 20 y.o.   MRN: [id number]  HPI    Review of Systems     Objective:   Physical Exam        Assessment & Plan:

## 2011-04-26 ENCOUNTER — Ambulatory Visit: Payer: Medicaid Other | Admitting: Family Medicine

## 2011-05-22 ENCOUNTER — Encounter: Payer: Self-pay | Admitting: Family Medicine

## 2011-05-22 ENCOUNTER — Ambulatory Visit (INDEPENDENT_AMBULATORY_CARE_PROVIDER_SITE_OTHER): Payer: Medicaid Other | Admitting: Family Medicine

## 2011-05-22 VITALS — BP 150/82 | HR 96 | Temp 98.1°F | Ht 71.0 in | Wt 206.0 lb

## 2011-05-22 DIAGNOSIS — G8929 Other chronic pain: Secondary | ICD-10-CM

## 2011-05-22 DIAGNOSIS — M5410 Radiculopathy, site unspecified: Secondary | ICD-10-CM

## 2011-05-22 DIAGNOSIS — T148XXA Other injury of unspecified body region, initial encounter: Secondary | ICD-10-CM

## 2011-05-22 MED ORDER — OXYCODONE HCL 30 MG PO TABS: 30.0000 mg | ORAL_TABLET | Freq: Four times a day (QID) | ORAL | Status: DC | PRN

## 2011-05-22 NOTE — Patient Instructions (Signed)
It was great to see you today!  Schedule an appointment to see me in 3 months.   I gave you three months prescription for your pain medications.

## 2011-05-22 NOTE — Progress Notes (Signed)
Patient ID: Jeffery Baldwin, male   DOB: November 04, 1990, 20 y.o.   MRN: [id number]  HPI 1. Chronic right shoulder pain Patient is here for a refill of his narcotic medication. He is doing well on the current pain regimen. He denies alcohol or drug abuse. He has a pain contract. He denies seeing other providers for prescriptions.  Review of Systems  Musculoskeletal:       Pain in right shoulder  Psychiatric/Behavioral: Negative for behavioral problems, sleep disturbance, self-injury, dysphoric mood and agitation. The patient is not nervous/anxious.        Objective:   Physical Exam  Nursing note and vitals reviewed. Constitutional: He appears well-developed and well-nourished. No distress.  Musculoskeletal: He exhibits tenderness.       Right arm in a sling  Skin: He is not diaphoretic.      Assessment & Plan:  1. Chronic pain right shoulder - nerve root avulsion - failed surgical repair -refilled his oxycodone - next refill 05/22/11 Three month supply given. Last refill starts 07/21/10

## 2011-07-06 ENCOUNTER — Other Ambulatory Visit: Payer: Self-pay | Admitting: Family Medicine

## 2011-07-06 DIAGNOSIS — G8929 Other chronic pain: Secondary | ICD-10-CM

## 2011-07-06 DIAGNOSIS — M5410 Radiculopathy, site unspecified: Secondary | ICD-10-CM

## 2011-07-06 DIAGNOSIS — T148XXA Other injury of unspecified body region, initial encounter: Secondary | ICD-10-CM

## 2011-07-06 MED ORDER — OXYCODONE HCL 30 MG PO TABS: 30.0000 mg | ORAL_TABLET | Freq: Four times a day (QID) | ORAL | Status: DC | PRN

## 2011-07-27 ENCOUNTER — Ambulatory Visit: Payer: Medicaid Other | Admitting: Family Medicine

## 2011-08-18 ENCOUNTER — Ambulatory Visit: Payer: Medicaid Other | Admitting: Family Medicine

## 2011-08-25 ENCOUNTER — Encounter: Payer: Self-pay | Admitting: Family Medicine

## 2011-08-25 ENCOUNTER — Ambulatory Visit (INDEPENDENT_AMBULATORY_CARE_PROVIDER_SITE_OTHER): Payer: Medicaid Other | Admitting: Family Medicine

## 2011-08-25 VITALS — BP 118/86 | HR 88 | Temp 98.6°F | Ht 71.0 in | Wt 206.0 lb

## 2011-08-25 DIAGNOSIS — G8929 Other chronic pain: Secondary | ICD-10-CM

## 2011-08-25 DIAGNOSIS — M5410 Radiculopathy, site unspecified: Secondary | ICD-10-CM

## 2011-08-25 DIAGNOSIS — F17200 Nicotine dependence, unspecified, uncomplicated: Secondary | ICD-10-CM

## 2011-08-25 DIAGNOSIS — F4010 Social phobia, unspecified: Secondary | ICD-10-CM | POA: Insufficient documentation

## 2011-08-25 MED ORDER — OXYCODONE HCL 30 MG PO TABS: 30.0000 mg | ORAL_TABLET | Freq: Four times a day (QID) | ORAL | Status: DC | PRN

## 2011-08-25 MED ORDER — CITALOPRAM HYDROBROMIDE 20 MG PO TABS: 20.0000 mg | ORAL_TABLET | Freq: Every day | ORAL | Status: DC

## 2011-08-25 NOTE — Progress Notes (Signed)
Patient ID: Jeffery Baldwin, male   DOB: 10/14/1990, 21 y.o.   MRN: [id number]  HPI 1. Chronic right shoulder pain Patient is here for a refill of his narcotic medication. He is doing well on the current pain regimen. He denies alcohol or drug abuse. He has a pain contract. He denies seeing other providers for prescriptions. He has no constipation. He has been out of his medications for 4 days. He has (diarrhea, diaphoresis, increased pain, leg movements at night) - all withdrawal symptoms.   2. Social anxiety Anxiety inventory positive -  Patient has for the last 6 months had near panic symptoms when in social situations. He says this starts with self-conciousness about his right arm. He believes his anxiety to be irrational, but cannot control it. He has to leave the room at times to calm himself down. Denies manic symptoms. Denies paranoia/hallucinations/delusional thinking. He has no suicidal thoughts.   Pertinent items are noted in HPI. No fever, chills, night sweats, weight loss.    Objective:   Physical Exam  Filed Vitals:   08/25/11 0909  BP: 118/86  Pulse: 88  Temp: 98.6 F (37 C)  TempSrc: Oral  Height: 5\' 11"  (1.803 m)  Weight: 206 lb (93.441 kg)   Constitutional: in mild distress (has not taken medication for 4 days) agitated.  Musculoskeletal: He exhibits tenderness. Right shoulder.       Right arm in a sling  Skin: He is not diaphoretic.     Assessment & Plan:

## 2011-08-25 NOTE — Patient Instructions (Signed)
It was great to see you today!  Schedule an appointment to see me in 2 weeks if the medication for anxiety isnt working. If you feel like its working, but could be better, call me so I can  Increase your dose. If you have side effects like we discussed give me a call.   I know its hard, but keep trying to quit smoking.

## 2011-08-25 NOTE — Assessment & Plan Note (Signed)
Advised to quit again. Patient desires to quit, but is not 100% ready.

## 2011-08-25 NOTE — Assessment & Plan Note (Signed)
6 month on and off history. Anxiety inventory positive. No suicidal or homicidal thoughts.  Starting celexa  F/u in 2 weeks per instructions.

## 2011-08-25 NOTE — Assessment & Plan Note (Signed)
Refilled his pain medication. No problems with this patient from a pain management standpoint.

## 2011-11-23 ENCOUNTER — Encounter: Payer: Self-pay | Admitting: Family Medicine

## 2011-11-23 ENCOUNTER — Ambulatory Visit (INDEPENDENT_AMBULATORY_CARE_PROVIDER_SITE_OTHER): Payer: Medicaid Other | Admitting: Family Medicine

## 2011-11-23 VITALS — BP 137/89 | HR 100 | Temp 97.8°F | Ht 71.0 in | Wt 211.8 lb

## 2011-11-23 DIAGNOSIS — T148XXA Other injury of unspecified body region, initial encounter: Secondary | ICD-10-CM

## 2011-11-23 DIAGNOSIS — M5410 Radiculopathy, site unspecified: Secondary | ICD-10-CM

## 2011-11-23 DIAGNOSIS — G8929 Other chronic pain: Secondary | ICD-10-CM

## 2011-11-23 MED ORDER — OXYCODONE HCL 30 MG PO TABS: 30.0000 mg | ORAL_TABLET | Freq: Four times a day (QID) | ORAL | Status: DC | PRN

## 2011-11-23 NOTE — Patient Instructions (Addendum)
I refilled your medication for three months.   Im glad you are doing better.  Meds ordered this encounter  Medications  . oxycodone (ROXICODONE) 30 MG immediate release tablet    Sig: Take 1 tablet (30 mg total) by mouth every 6 (six) hours as needed for pain.    Dispense:  120 tablet    Refill:  0    Fill on or after 11/23/11  . oxycodone (ROXICODONE) 30 MG immediate release tablet    Sig: Take 1 tablet (30 mg total) by mouth every 6 (six) hours as needed for pain.    Dispense:  120 tablet    Refill:  0    Fill on or after 12/23/11  . oxycodone (ROXICODONE) 30 MG immediate release tablet    Sig: Take 1 tablet (30 mg total) by mouth every 6 (six) hours as needed for pain.    Dispense:  120 tablet    Refill:  0    Fill on or after 01/23/12   I performed a urine drug screen today which is standard for chronic pain management.   I will call you if the results come back abnormal. Otherwise I will see you in three months.

## 2011-11-23 NOTE — Progress Notes (Signed)
Patient ID: Jeffery Baldwin, male   DOB: 12-20-1990, 21 y.o.   MRN: [id number]  HPI 1. Chronic right shoulder pain Patient is here for a refill of his narcotic medication. He is doing well on the current pain regimen. He denies alcohol or drug abuse. He has a pain contract. He denies seeing other providers for prescriptions. He has no constipation. He has no withdrawal symptoms.  UDS ordered today.   Pertinent items are noted in HPI. No fever, chills, night sweats, weight loss.    Objective:   Physical Exam  Filed Vitals:   11/23/11 1411  BP: 137/89  Pulse: 100  Temp: 97.8 F (36.6 C)  TempSrc: Oral  Height: 5\' 11"  (1.803 m)  Weight: 211 lb 12.8 oz (96.072 kg)   Constitutional: in mild distress (has not taken medication for 4 days) agitated.  Musculoskeletal: He exhibits tenderness. Right shoulder.       Right arm in a sling  Skin: He is not diaphoretic.     Assessment & Plan:

## 2011-11-23 NOTE — Assessment & Plan Note (Signed)
Urine drug screen ordered today. Will follow those results. Refilled three months of pain medication. So far compliant with contract.

## 2011-11-24 LAB — DRUG SCREEN URINE W/ALC, NO CONF
Barbiturate Quant, Ur: NEGATIVE
Creatinine,U: 101.2 mg/dL
Ethyl Alcohol: <10 mg/dL (ref <10)
Opiate Screen, Urine: NEGATIVE
Propoxyphene: NEGATIVE

## 2011-12-10 ENCOUNTER — Encounter (HOSPITAL_COMMUNITY): Payer: Self-pay | Admitting: Nurse Practitioner

## 2011-12-10 ENCOUNTER — Telehealth: Payer: Self-pay | Admitting: Emergency Medicine

## 2011-12-10 ENCOUNTER — Emergency Department (HOSPITAL_COMMUNITY)
Admission: EM | Admit: 2011-12-10 | Discharge: 2011-12-10 | Disposition: A | Discharge status: Home / Self Care | Payer: Medicaid Other | Attending: Emergency Medicine | Admitting: Emergency Medicine

## 2011-12-10 ENCOUNTER — Emergency Department (HOSPITAL_COMMUNITY): Payer: Medicaid Other

## 2011-12-10 DIAGNOSIS — M25559 Pain in unspecified hip: Secondary | ICD-10-CM

## 2011-12-10 DIAGNOSIS — M545 Low back pain, unspecified: Secondary | ICD-10-CM | POA: Insufficient documentation

## 2011-12-10 MED ORDER — HYDROMORPHONE HCL PF 1 MG/ML IJ SOLN
2.0000 mg | Freq: Once | INTRAMUSCULAR | Status: AC
  Administered 2011-12-10: 2 mg via INTRAMUSCULAR
  Filled 2011-12-10: qty 2

## 2011-12-10 MED ORDER — ONDANSETRON 4 MG PO TBDP
8.0000 mg | ORAL_TABLET | Freq: Once | ORAL | Status: AC
  Administered 2011-12-10: 8 mg via ORAL
  Filled 2011-12-10: qty 2

## 2011-12-10 MED ORDER — HYDROMORPHONE HCL 4 MG PO TABS: 4.0000 mg | ORAL_TABLET | ORAL | Status: AC | PRN

## 2011-12-10 NOTE — Telephone Encounter (Signed)
Patient called back saying he was not able to get himself to the ED due to pain.  He was unable to get out of bed.  Recommended that he call EMS for transport to the hospital.  This was a concern as he does not have insurance, but given the severity of his pain and lack of mobility I strongly encouraged him to get to the ED for evaluation.

## 2011-12-10 NOTE — ED Notes (Signed)
Pt states he woke 4 days ago and noticed pain in bilateral hips. Denies any injuries. States the pain has persisted since onset and is too painful to ambulate

## 2011-12-10 NOTE — Telephone Encounter (Signed)
Patient called emergency line for 4 days of worsening bilateral hip and leg pain.  States he woke up 4 days ago with this pain and it has not improved.  His home oxycodone has not been helping.  Unable to stand or sit for prolonged periods due to pain.  Does describe some lower extremity swelling.  No erythema or fevers.  Recommended that he go to the ED to be evaluated.

## 2011-12-10 NOTE — ED Provider Notes (Signed)
History   This chart was scribed for Jeffery Booze, MD scribed by Magnus Sinning. The patient was seen in room TR05C/TR05C seen at 14:05   CSN: [id number]  Arrival date & time 12/10/11  1234   None     Chief Complaint  Patient presents with  . Hip Pain    (Consider location/radiation/quality/duration/timing/severity/associated sxs/prior treatment) HPI Jeffery Baldwin is a 21 y.o. male who presents to the Emergency Department complaining of constant moderate sharp and throbbing bilateral hip pain that radiates into both legs, onset 4 days. Explains the right side was hurting worse yesterday. Patient denies any recent injury and states pain aggravated with walking. He says pain is currently a 7.5 /10 and at its worse a 10/10. Says he woke up on left side 4 days ago with hip pain. Denies weakness, tingling, or incontinence. Patient states he has only used heating pads at home with no relief. States she called PCP, Dr.Orton this morning and was advised to present to ED for eval. Patient is a current smoker at 1 pack/ day. Past Medical History  Diagnosis Date  . Nerve root avulsion     Past Surgical History  Procedure Date  . Nerve root repair     History reviewed. No pertinent family history.  History  Substance Use Topics  . Smoking status: Current Everyday Smoker -- 0.5 packs/day for 7 years    Types: Cigarettes  . Smokeless tobacco: Never Used  . Alcohol Use: Yes      Review of Systems  Musculoskeletal:       Positive hip pain and leg pain    Allergies  Ms contin  Home Medications   Current Outpatient Rx  Name Route Sig Dispense Refill  . CITALOPRAM HYDROBROMIDE 20 MG PO TABS Oral Take 1 tablet (20 mg total) by mouth daily. 30 tablet 5  . OXYCODONE HCL 30 MG PO TABS Oral Take 1 tablet (30 mg total) by mouth every 6 (six) hours as needed for pain. 120 tablet 0    Fill on or after 11/23/11  . OXYCODONE HCL 30 MG PO TABS Oral Take 1 tablet (30 mg total) by mouth every 6  (six) hours as needed for pain. 120 tablet 0    Fill on or after 12/23/11  . OXYCODONE HCL 30 MG PO TABS Oral Take 1 tablet (30 mg total) by mouth every 6 (six) hours as needed for pain. 120 tablet 0    Fill on or after 01/23/12    BP 128/74  Pulse 96  Temp 98.6 F (37 C) (Oral)  Resp 16  Ht 5\' 11"  (1.803 m)  Wt 185 lb (83.915 kg)  BMI 25.80 kg/m2  SpO2 98%  Physical Exam  Nursing note and vitals reviewed. Constitutional: He is oriented to person, place, and time. He appears well-developed and well-nourished. No distress.  HENT:  Head: Normocephalic and atraumatic.  Eyes: EOM are normal.  Neck: Neck supple. No tracheal deviation present.  Cardiovascular: Normal rate.   Pulmonary/Chest: Effort normal. No respiratory distress.  Musculoskeletal: Normal range of motion. He exhibits tenderness.       Mild tenderness of hips. Pain with ROM of either hip. Positive straight leg raise bilaterally at 30 degrees. Sensory loss of right foot that is chronic.   Neurological: He is alert and oriented to person, place, and time.  Skin: Skin is warm and dry.  Psychiatric: He has a normal mood and affect. His behavior is normal.    ED Course  Procedures (including critical care time) DIAGNOSTIC STUDIES: Oxygen Saturation is 98% on room air, normal by my interpretation.    COORDINATION OF CARE:  Dg Lumbar Spine Complete  12/10/2011  *RADIOLOGY REPORT*  Clinical Data: Low back and bilateral hip pain.  No known injury.  LUMBAR SPINE - COMPLETE 4+ VIEW  Comparison: None.  Findings: Five non-rib bearing lumbar vertebrae.  Minimal levoconvex scoliosis. The patient was unable to cooperate for an upright lateral view and there is no coned-down lateral view.  No gross fractures, pars defects or subluxations are seen.  IMPRESSION: Limited examination with no gross acute abnormality.  Routine upright views are recommended when the patient is able to cooperate.  Original Report Authenticated By: Darrol Angel, M.D.   Dg Hip Bilateral Vito Berger  12/10/2011  *RADIOLOGY REPORT*  Clinical Data: Low back and bilateral hip pain.  No known injury.  BILATERAL HIP WITH PELVIS - 4+ VIEW  Comparison: None.  Findings: Normal appearing hips, pelvic bones and lower lumbar spine.  IMPRESSION: Normal examination.  Original Report Authenticated By: Darrol Angel, M.D.     1. Hip pain       MDM  Hip pain of uncertain cause. X-rays were obtained of hip and lumbar spine.  X-rays are unremarkable. You'll be treated symptomatically. He is sent home with prescription for hydromorphone.  I personally performed the services described in this documentation, which was scribed in my presence. The recorded information has been reviewed and considered.           Jeffery Booze, MD 12/14/11 [phone]

## 2012-02-13 ENCOUNTER — Telehealth: Payer: Self-pay | Admitting: Family Medicine

## 2012-02-13 NOTE — Telephone Encounter (Signed)
Patient will not be making his 8:30am appointment tomorrow due to schedule conflict.  I do not know how to remove him so am sending this message on to PCP and fpc-admin.

## 2012-02-14 ENCOUNTER — Ambulatory Visit: Payer: Medicaid Other | Admitting: Sports Medicine

## 2012-02-16 ENCOUNTER — Encounter: Payer: Self-pay | Admitting: Sports Medicine

## 2012-02-16 ENCOUNTER — Ambulatory Visit (INDEPENDENT_AMBULATORY_CARE_PROVIDER_SITE_OTHER): Payer: Medicaid Other | Admitting: Sports Medicine

## 2012-02-16 VITALS — BP 157/91 | HR 92 | Temp 98.1°F | Ht 71.0 in | Wt 209.0 lb

## 2012-02-16 DIAGNOSIS — F4010 Social phobia, unspecified: Secondary | ICD-10-CM

## 2012-02-16 DIAGNOSIS — G894 Chronic pain syndrome: Secondary | ICD-10-CM | POA: Insufficient documentation

## 2012-02-16 DIAGNOSIS — G8929 Other chronic pain: Secondary | ICD-10-CM

## 2012-02-16 DIAGNOSIS — F17200 Nicotine dependence, unspecified, uncomplicated: Secondary | ICD-10-CM

## 2012-02-16 DIAGNOSIS — M5410 Radiculopathy, site unspecified: Secondary | ICD-10-CM

## 2012-02-16 DIAGNOSIS — R030 Elevated blood-pressure reading, without diagnosis of hypertension: Secondary | ICD-10-CM

## 2012-02-16 MED ORDER — OXYCODONE HCL 30 MG PO TABS: 30.0000 mg | ORAL_TABLET | Freq: Four times a day (QID) | ORAL | Status: DC | PRN

## 2012-02-16 NOTE — Assessment & Plan Note (Signed)
Will continue to monitor.  Pt reports Daily RedBull consumption but has noticed BP being elevated. Will check at next visit

## 2012-02-16 NOTE — Assessment & Plan Note (Signed)
Contemplative stage, wants to discussion options at next visit. Mentions he is interested in trying electronic cigarettes

## 2012-02-16 NOTE — Assessment & Plan Note (Addendum)
See chronic pain section Pt dose have f/u appointment schedule with local Neurosurgeon

## 2012-02-16 NOTE — Patient Instructions (Addendum)
It was nice to meet you.  I am not going to change any of your medication today.  Please follow up in 1 month

## 2012-02-16 NOTE — Assessment & Plan Note (Signed)
Continue Celexa

## 2012-02-16 NOTE — Progress Notes (Signed)
  [name]  Patient name: Jeffery Baldwin MRN [id number]  Date of birth: 26-Sep-1990  CC & HPI:  Jeffery Baldwin is a 21 y.o. male presenting today for follow up of his chronic pain.  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  Pain Scale rating (0-10) in PAST 24 Hours: BEST 4/10  WORST 10/10   Adverse Effects of Medications:  NO SIDE EFFECTS  Unless otherwise indicated below  Nausea   Vomiting   Confusion   Sleepiness   Fatigue   Constipation   OTHER   Treatment of Adverse Effects: None  Overall PAIN RELIEF provided since: Onset of Treatment 50%  Last Visit 10%   Overall PAIN RELIEF provided from: MEDICATIONS 50%  MODALITIES 10%   Using Likert scale (0 = Does not interfere; 10 - completely interferes) describe how - during the last 24 hours - pain has interfered with pt's: General Activity 5/10  Mood 4/10  Ability to work (in or out of home) 5/10  Interactions with other people 6/10  Sleep 6/10  Enjoyment of Life 5/10   ROS:  No chest pain, or shortness of breath. Chronic cough occasionally waking him at night   Pertinent History Reviewed:  Medical & Surgical Hx:  Reviewed: Significant for nerve root repair Medications: Reviewed & Updated - see associated section Social History: Reviewed - Significant for current everyday smoker  Objective Findings:  Vitals:  Filed Vitals:   02/16/12 0901  BP: 157/91  Pulse: 92  Temp: 98.1 F (36.7 C)   PE: GENERAL:  Adult Caucasian male. In no discomfort; no respiratory distress. MSK: R arm in sling.  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  Flexure and extensor contracture.  Flaccid in adduction and extension.  Insensate to touch in C6, C7, C8, T1 dermatomes.  UE DTRs absent.  TTP over R trapezius  Assessment & Plan:

## 2012-02-16 NOTE — Assessment & Plan Note (Signed)
Pt re-signed new pt contract Refilled his pain medicine today.  Discussed only being able to fill every 30 days.  Will rx 1 month.  Consider 3 month rx at next visit.  Will perform [facility] board review before next visit Stable at this time

## 2012-02-16 NOTE — Progress Notes (Deleted)
  [name]  Patient name: Jeffery Baldwin MRN [id number]  Date of birth: 18-Sep-1990  CC & HPI:  Jeffery Baldwin is a 21 y.o. male presenting today for chronic pain follow up.  Chronic Pain Follow-Up Assessment Chronic Pain Diagnosis: Cervical Nerve Root Avulsion C7/C8  Pain Scale rating (0-10) in PAST 24 Hours: BEST 4/10  WORST 10/10   Adverse Effects of Medications:  NO SIDE EFFECTS  Unless otherwise indicated below  Nausea   Vomiting   Confusion   Sleepiness   Fatigue   Constipation   OTHER   Treatment of Adverse Effects: None  Overall PAIN RELIEF provided since: Onset of Treatment 50%  Last Visit 0-10%   Overall PAIN RELIEF provided from: MEDICATIONS 50%  MODALITIES 10%   Using Likert scale (0 = Does not interfere; 10 - completely interferes) describe how - during the last 24 hours - pain has interfered with pt's: General Activity 5/10  Mood 4/10  Ability to work (in or out of home) 5/10  Interactions with other people 6/10  Sleep 6/10  Enjoyment of Life 5/10   ROS:  Per HPI  Pertinent History Reviewed:  Medical & Surgical Hx:  Reviewed: Significant for *** Medications: Reviewed & Updated - see associated section Social History: Reviewed - Significant for ***  Objective Findings:  Vitals:  Filed Vitals:   02/16/12 0901  BP: 157/91  Pulse: 92  Temp: 98.1 F (36.7 C)    ***  Assessment & Plan:

## 2012-03-19 ENCOUNTER — Ambulatory Visit (INDEPENDENT_AMBULATORY_CARE_PROVIDER_SITE_OTHER): Payer: Medicaid Other | Admitting: Sports Medicine

## 2012-03-19 ENCOUNTER — Encounter: Payer: Self-pay | Admitting: Sports Medicine

## 2012-03-19 VITALS — BP 138/86 | HR 101 | Temp 98.2°F | Ht 71.0 in | Wt 210.4 lb

## 2012-03-19 DIAGNOSIS — F17200 Nicotine dependence, unspecified, uncomplicated: Secondary | ICD-10-CM

## 2012-03-19 DIAGNOSIS — G8929 Other chronic pain: Secondary | ICD-10-CM

## 2012-03-19 DIAGNOSIS — R030 Elevated blood-pressure reading, without diagnosis of hypertension: Secondary | ICD-10-CM

## 2012-03-19 DIAGNOSIS — T148XXA Other injury of unspecified body region, initial encounter: Secondary | ICD-10-CM

## 2012-03-19 DIAGNOSIS — M5410 Radiculopathy, site unspecified: Secondary | ICD-10-CM

## 2012-03-19 MED ORDER — OXYCODONE HCL 30 MG PO TABS: 30.0000 mg | ORAL_TABLET | Freq: Four times a day (QID) | ORAL | Status: DC | PRN

## 2012-03-19 MED ORDER — VARENICLINE TARTRATE 0.5 MG PO TABS: ORAL_TABLET | ORAL | Status: DC

## 2012-03-19 NOTE — Assessment & Plan Note (Addendum)
Chantix rx today.  Reviewed red flags and instructions for med titration Decided on Quit Date of 03/24/2012 @ 930AM - 1 week from the passing of his grandfather

## 2012-03-19 NOTE — Patient Instructions (Addendum)
Am sorry to hear about your grandfathers passing.  The we are here to support you anyway needed. I sent prescription for Chantix You stated that you're quit date was this Sunday, October 20 at 9:30 AM, one week after your grandfather's passing. I would like to see how you are doing in 2 weeks.  Cut out the RED BULL!

## 2012-03-22 ENCOUNTER — Encounter: Payer: Self-pay | Admitting: Sports Medicine

## 2012-03-22 NOTE — Progress Notes (Signed)
  [facility]  Patient name: Jeffery Baldwin MRN [id number]  Date of birth: [birth year]  CC & HPI:  Jeffery Baldwin is a 21 y.o. male presenting today for follow up of:  Chronic Pain 2o/2 Nerve Root avulsion.  Reports no new problems.  Tolerating medications well with no side effects.  Allows him increased functional status at home.    Reports grandfather just passed away from Lung Cancer.  Intersted in stopping smoking.  Quit date 1 week from GF time of death.   Reports Current everyday use of Energy Drinks.  Feels this is why his BP is up today.    ROS:  PER HPI  Pertinent History Reviewed:  Medical & Surgical Hx:  Reviewed: Significant for Nerve root avulsion  Medications: Reviewed & Updated - see associated section Social History: Reviewed - Significant for current every day smoker  Objective Findings:  Vitals:  Filed Vitals:   03/19/12 1359  BP: 173/83  Pulse: 101  Temp: 98.2 F (36.8 C)   PE: GENERAL:  Adult Caucasian male. In no discomfort; no respiratory distress. PSYCH: normal affect, normal mood, reports coping as well as he can with his grandfathers death MSK: R arm in sling.  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  Flexure and extensor contracture.  Flaccid in adduction and extension.  Insensate to touch in C6, C7, C8, T1 dermatomes.  UE DTRs absent.  TTP over R trapezius  Assessment & Plan:

## 2012-03-22 NOTE — Assessment & Plan Note (Signed)
Refill on pain meds  

## 2012-03-22 NOTE — Assessment & Plan Note (Signed)
Lower on recheck, Emphasized quitting smoking and STOPPING red bull.  Pt agrees to try

## 2012-04-17 ENCOUNTER — Encounter: Payer: Self-pay | Admitting: Sports Medicine

## 2012-04-17 ENCOUNTER — Ambulatory Visit (INDEPENDENT_AMBULATORY_CARE_PROVIDER_SITE_OTHER): Payer: Self-pay | Admitting: Sports Medicine

## 2012-04-17 VITALS — BP 139/73 | HR 77 | Temp 97.6°F | Ht 71.0 in | Wt 212.0 lb

## 2012-04-17 DIAGNOSIS — G894 Chronic pain syndrome: Secondary | ICD-10-CM

## 2012-04-17 DIAGNOSIS — R030 Elevated blood-pressure reading, without diagnosis of hypertension: Secondary | ICD-10-CM

## 2012-04-17 DIAGNOSIS — F4010 Social phobia, unspecified: Secondary | ICD-10-CM

## 2012-04-17 DIAGNOSIS — F17200 Nicotine dependence, unspecified, uncomplicated: Secondary | ICD-10-CM

## 2012-04-17 DIAGNOSIS — G8929 Other chronic pain: Secondary | ICD-10-CM

## 2012-04-17 DIAGNOSIS — M5410 Radiculopathy, site unspecified: Secondary | ICD-10-CM

## 2012-04-17 MED ORDER — OXYCODONE HCL 30 MG PO TABS: 30.0000 mg | ORAL_TABLET | Freq: Four times a day (QID) | ORAL | Status: DC | PRN

## 2012-04-17 NOTE — Patient Instructions (Addendum)
It was good to see you.  Please go to your supply store and get a sling and swath  Please follow up with Kendal Hymen in the Lab one morning Follow up with Dr. Raymondo Band for smoking cessation Please call [facility] and discuss counseling for your anxiety  Follow up with me in 1 month.

## 2012-04-23 NOTE — Progress Notes (Signed)
  [name]  Patient name: Jeffery Baldwin MRN [id number]  Date of birth: 03/27/1991  CC & HPI:  Jeffery Baldwin is a 21 y.o. male presenting today for follow up of his chronic pain syndrome 2o/2 nerve root avulsion. Pain is well controlled on current pain regimen.  No side effects for medications.  Is able to function throughout the day at home helping out with his family members.  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  He is otherwise unemployed.  Reports wanting to get back to running but cannot due to innability to secure arm against chest.  Does report significant anxiety regarding family matters since his grandfather recently passed away.  He does desire to quit smoking especially after watching his grandfather pass away with lung cancer however continues to smoke.  He was reportedly time with his emotions especially following prescription father's death and would like to speak with somebody about these.  Does not wish for medications at this time.  Celexa caused some dysphoria  Reports significant decreases in caffeine consumption and energy during consumption but does have multiple bottled coffee drinks throughout the day.  Denies any palpitations, syncope presyncope or chest pain.    ROS:  Per history of present illness  Pertinent History Reviewed:  Medical & Surgical Hx:  Reviewed: Significant for nerve root avulsion, tobacco dependence, elevated blood pressure Medications: Reviewed & Updated - see associated section Social History: Reviewed -  reports that he has been smoking Cigarettes.  He has a 3.5 pack-year smoking history. He has never used smokeless tobacco.   Objective Findings:  Vitals:  Filed Vitals:   04/17/12 1502  BP: 139/73  Pulse: 77  Temp: 97.6 F (36.4 C)  PE: GENERAL:  Adult Caucasian male. In no discomfort; no respiratory distress. PSYCH; good insight, mood anxious.  Multiple stressors at home but reports he is having a difficult  time coping with everything going on.   MSK: R arm in sling.  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  Flexure and extensor contracture.  Flaccid in adduction and extension.  Insensate to touch in C6, C7, C8, T1 dermatomes.  UE DTRs absent.  TTP over R trapezius  Assessment & Plan:

## 2012-04-23 NOTE — Assessment & Plan Note (Signed)
Pt did not adhere to previously set quit date. Very interested but not sure of when or how Counseling today Encouraged to follow up with Dr. Raymondo Band Rx today for Chantix

## 2012-04-23 NOTE — Assessment & Plan Note (Addendum)
Refilled pain medicines Order for sling and swath to help hold arm in place during activity >consider referral back to tertiary neurosurgery given new technologies

## 2012-04-23 NOTE — Assessment & Plan Note (Addendum)
Improved now that cut back on energy drinks Will continue to cut back and needs to completely cut out all energy/coffee drinks- encouraged to do so

## 2012-04-23 NOTE — Assessment & Plan Note (Signed)
Refilled pain meds 

## 2012-04-23 NOTE — Assessment & Plan Note (Signed)
Pt to follow up with [facility] for counseling

## 2012-04-25 ENCOUNTER — Other Ambulatory Visit: Payer: Self-pay

## 2012-04-25 ENCOUNTER — Ambulatory Visit: Payer: Self-pay | Admitting: Pharmacist

## 2012-05-17 ENCOUNTER — Encounter: Payer: Self-pay | Admitting: Sports Medicine

## 2012-05-17 ENCOUNTER — Ambulatory Visit (INDEPENDENT_AMBULATORY_CARE_PROVIDER_SITE_OTHER): Payer: Self-pay | Admitting: Sports Medicine

## 2012-05-17 VITALS — BP 142/78 | HR 101 | Temp 98.0°F | Ht 71.0 in | Wt 217.3 lb

## 2012-05-17 DIAGNOSIS — E669 Obesity, unspecified: Secondary | ICD-10-CM

## 2012-05-17 DIAGNOSIS — R030 Elevated blood-pressure reading, without diagnosis of hypertension: Secondary | ICD-10-CM

## 2012-05-17 DIAGNOSIS — M5410 Radiculopathy, site unspecified: Secondary | ICD-10-CM

## 2012-05-17 DIAGNOSIS — G8929 Other chronic pain: Secondary | ICD-10-CM

## 2012-05-17 DIAGNOSIS — T148XXA Other injury of unspecified body region, initial encounter: Secondary | ICD-10-CM

## 2012-05-17 DIAGNOSIS — IMO0001 Reserved for inherently not codable concepts without codable children: Secondary | ICD-10-CM

## 2012-05-17 DIAGNOSIS — F17200 Nicotine dependence, unspecified, uncomplicated: Secondary | ICD-10-CM

## 2012-05-17 DIAGNOSIS — G894 Chronic pain syndrome: Secondary | ICD-10-CM

## 2012-05-17 MED ORDER — OXYCODONE HCL 30 MG PO TABS: 30.0000 mg | ORAL_TABLET | Freq: Four times a day (QID) | ORAL | Status: DC | PRN

## 2012-05-17 NOTE — Patient Instructions (Addendum)
It was nice to see you today.   Today we discussed: 2. Chronic pain syndrome  I have refilled your pain medicines.    4. Elevated BP  Work on decreasing the salt in your diet.  Remember we want to limit your salt intake to less than 3000mg  per day.  Read nutrition labels  5. SMOKER  Call 1800 QUIT NOW or go on line and check them out    6. Obesity  Here are some basic nutrition rules to remember:  "Eat Real Foods & Drink Real Drinks" - if you think it was made in a factory . . it is likely best to avoid it as a staple in your diet.  Limiting these types of foods to 1-2 times per week is a good idea.  Sticking with fresh fruits and vegetables as well as home cooked meals will typically provide more nutrition and less salt than prepackaged meals.     Limit the amount of sugar sweetened and artificially sweetened foods and beverages.  Sticking with water flavored with a slice of lemon, lime or orange is a great option if you want something with flavor in it.  Using flavored seltzer water to flavor plain water will also add some bite if you want something more than flavor.     Here are 2 of my favorite web sites that provide great nutrition and exercise advice.   www.eatsmartmovemore[place].com   https://wilkins.info/  Movies to watch on [employer]   "Forks over [employer]"   TED talks - "chew on this" & "weekday veg"  Please call [facility] in the front office to discuss obtaining an [employer].  This can help pay for visits and labs.  I do recommend you come in ASAP one morning for labs with Kendal Hymen.     Please plan to return to see me in 1 month.  If you need anything prior to seeing me please call the clinic.  Please Bring all medications with you to each appointment.

## 2012-05-17 NOTE — Assessment & Plan Note (Signed)
Reduced to 3 q day. See AVS

## 2012-05-17 NOTE — Assessment & Plan Note (Signed)
Stable/Well Controlled - no changes at this time. Refilled meds

## 2012-05-17 NOTE — Assessment & Plan Note (Signed)
BMI >30 today.   Nutrition discussed today. See AVS

## 2012-05-17 NOTE — Assessment & Plan Note (Signed)
Working on diet, see AVS

## 2012-05-21 NOTE — Progress Notes (Signed)
  [name]  Patient name: Jeffery Baldwin MRN [id number]  Date of birth: 08/13/1990  CC & HPI:  Jeffery Baldwin is a 21 y.o. male presenting today for follow up of his chronic pain syndrome 2o/2 nerve root avulsion. Pain is well controlled on current pain regimen.  No side effects for medications.  Is able to function throughout the day at home.  Currently daily duties including helping out with his family members. Has enrolled for school next semester.  Has not picked up swath for sling.   Continues to smoke.  Having issues with weight gain.  Feels due to intake and lack of exercise.  Feels he can make adequate changes.  BP elevated initially but improved with recheck.    Continues to decrease caffeine consumption and energy during consumption but does have multiple bottled coffee drinks throughout the day.  Denies any palpitations, syncope presyncope or chest pain.   ROS:  Per history of present illness  Pertinent History Reviewed:  Medical & Surgical Hx:  Reviewed: Significant for nerve root avulsion, tobacco dependence, elevated blood pressure Medications: Reviewed & Updated - see associated section Social History: Reviewed -  reports that he has been smoking Cigarettes.  He has a 3.5 pack-year smoking history. He has never used smokeless tobacco.   Objective Findings:  Vitals:  Filed Vitals:   04/17/12 1502  BP: 139/73  Pulse: 77  Temp: 97.6 F (36.4 C)  PE: GENERAL:  Adult Caucasian male. In no discomfort; no respiratory distress. PSYCH; good insight, mood anxious.  Multiple stressors at home but reports he is having a difficult time coping with everything going on.   HEART: RRR, S1/S2 heard, no murmur LUNGS: CTA B, no wheezes, no crackles MSK: R arm in sling.  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  Flexure and extensor contracture.  Flaccid in adduction and extension.  Insensate to touch in C6, C7, C8, T1 dermatomes.  UE DTRs  absent.  TTP over R trapezius  Assessment & Plan:

## 2012-06-18 ENCOUNTER — Ambulatory Visit (INDEPENDENT_AMBULATORY_CARE_PROVIDER_SITE_OTHER): Payer: Self-pay | Admitting: Sports Medicine

## 2012-06-18 ENCOUNTER — Encounter: Payer: Self-pay | Admitting: Sports Medicine

## 2012-06-18 VITALS — BP 138/82 | HR 97 | Temp 98.0°F | Wt 218.8 lb

## 2012-06-18 DIAGNOSIS — F17200 Nicotine dependence, unspecified, uncomplicated: Secondary | ICD-10-CM

## 2012-06-18 DIAGNOSIS — T148XXA Other injury of unspecified body region, initial encounter: Secondary | ICD-10-CM

## 2012-06-18 DIAGNOSIS — M5410 Radiculopathy, site unspecified: Secondary | ICD-10-CM

## 2012-06-18 DIAGNOSIS — G8929 Other chronic pain: Secondary | ICD-10-CM

## 2012-06-18 DIAGNOSIS — R030 Elevated blood-pressure reading, without diagnosis of hypertension: Secondary | ICD-10-CM

## 2012-06-18 DIAGNOSIS — E669 Obesity, unspecified: Secondary | ICD-10-CM

## 2012-06-18 MED ORDER — OXYCODONE HCL 30 MG PO TABS: 30.0000 mg | ORAL_TABLET | Freq: Four times a day (QID) | ORAL | Status: DC | PRN

## 2012-06-18 NOTE — Progress Notes (Signed)
[facility]  Patient name: Jeffery Baldwin MRN [id number]  Date of birth: [birth year]  CC & HPI:  Jeffery Baldwin is a 22 y.o. male presenting today for follow up of:  Chronic Pain Follow-Up Assessment Chronic Pain Diagnosis: Nerve Root Avulsion  Pain Scale rating (0-10) in PAST 24 Hours: BEST 2/10  WORST 7/10   Adverse Effects of Medications:  NO SIDE EFFECTS  Unless otherwise indicated below  Nausea   Vomiting   Confusion   Sleepiness   Fatigue   Not new  Constipation   OTHER   Treatment of Adverse Effects: Increasing activity level, regulating sleep behaviors  Overall PAIN RELIEF provided since: Onset of Treatment 80%  Last Visit 10%   Overall PAIN RELIEF provided from: MEDICATIONS 75%  MODALITIES 25%   Using Likert scale (0 = Does not interfere; 10 - completely interferes) describe how - during the last 24 hours - pain has interfered with pt's: General Activity 0 /10  Mood 8/10  Ability to work (in or out of home) 2/10  Interactions with other people 2/10  Sleep 2/10  Enjoyment of Life 3/10    # Tobacco abuse:  Has cut back to 2 cig / day.  Not interested in medications at this time.    # Elevated BP: taking medications as instructed, no medication side effects noted, possible neurological symptoms none, no chest pain on exertion, no dyspnea on exertion and no swelling of ankles. Has cut back on energy drinks   ------------------------------------------------------------------------------------------------------------------ Medication Compliance: compliant all of the time  Diet Compliance: compliant most of the time  ------------------------------------------------------------------------------------------------------------------ New Concerns:  None   ROS:  PER HPI  Pertinent History Reviewed:  Medical & Surgical Hx:  Reviewed: Significant for Nerve root avulsion.  Medications: Reviewed & Updated - see associated section Social History:  Reviewed -  reports that he has been smoking Cigarettes.  He has a 3.5 pack-year smoking history. He has never used smokeless tobacco.   Objective Findings:  Vitals:  Filed Vitals:   06/18/12 1505  BP: 149/95  Pulse: 97  Temp: 98 F (36.7 C)     [name]  Patient name: Jeffery Baldwin MRN [id number]  Date of birth: 10-07-1990  CC & HPI:  Jeffery Baldwin is a 22 y.o. male presenting today for follow up of his chronic pain syndrome 2o/2 nerve root avulsion. Pain is well controlled on current pain regimen.  No side effects for medications.  Is able to function throughout the day at home.  Currently daily duties including helping out with his family members. Has enrolled for school next semester.  Has not picked up swath for sling.   #Continues to smoke.  Having issues with weight gain.  Feels due to intake and lack of exercise.  Feels he can make adequate changes.  BP elevated initially but improved with recheck.    #Continues to decrease caffeine consumption and energy during consumption but does have multiple bottled coffee drinks throughout the day.  Denies any palpitations, syncope presyncope or chest pain.  #obesity: working on starting to exercise daily.  Goal of running 8 miles per day as he feels he will be able to tolerate this  ROS:  Per history of present illness  Pertinent History Reviewed:  Medical & Surgical Hx:  Reviewed: Significant for nerve root avulsion, tobacco dependence, elevated blood pressure Medications: Reviewed & Updated - see associated section Social History: Reviewed -  reports that he has  been smoking Cigarettes.  He has a 3.5 pack-year smoking history. He has never used smokeless tobacco.   Objective Findings:  Vitals:  Filed Vitals:   04/17/12 1502  BP: 139/73  Pulse: 77  Temp: 97.6 F (36.4 C)  PE: GENERAL:  Adult Caucasian male. In no discomfort; no respiratory distress. PSYCH; good insight, mood anxious.  Reports  dealing with stresses at home better. HEART: RRR, S1/S2 heard, no murmur LUNGS: CTA B, no wheezes, no crackles MSK: R arm in sling.  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  Flexure and extensor contracture.  Flaccid in adduction and extension.  Insensate to touch in C6, C7, C8, T1 dermatomes.  R UE DTRs absent.  TTP over R trapezius  Assessment & Plan:      Assessment & Plan:

## 2012-06-20 NOTE — Assessment & Plan Note (Signed)
Continue to cut back No using chantix

## 2012-06-20 NOTE — Assessment & Plan Note (Signed)
Improved now that he has stopped energy drinks and coffee

## 2012-06-20 NOTE — Assessment & Plan Note (Signed)
Working increasing activity Suggested slow return to activity with run/walking

## 2012-06-20 NOTE — Assessment & Plan Note (Addendum)
Pt stable on Oxycodone 30mg  IR.  Refilled today. >consider transition to long acting >consider referal to Pain medicine once gets orange card >consider tertiary referral back to neurosurgery for re-eval if able to obtain insurance.

## 2012-07-15 ENCOUNTER — Encounter: Payer: Self-pay | Admitting: Sports Medicine

## 2012-07-15 ENCOUNTER — Ambulatory Visit (INDEPENDENT_AMBULATORY_CARE_PROVIDER_SITE_OTHER): Payer: Self-pay | Admitting: Sports Medicine

## 2012-07-15 VITALS — BP 135/88 | HR 92 | Temp 98.5°F | Ht 71.0 in | Wt 224.0 lb

## 2012-07-15 DIAGNOSIS — G894 Chronic pain syndrome: Secondary | ICD-10-CM

## 2012-07-15 DIAGNOSIS — F4010 Social phobia, unspecified: Secondary | ICD-10-CM

## 2012-07-15 DIAGNOSIS — F17200 Nicotine dependence, unspecified, uncomplicated: Secondary | ICD-10-CM

## 2012-07-15 DIAGNOSIS — E669 Obesity, unspecified: Secondary | ICD-10-CM

## 2012-07-15 MED ORDER — OXYCODONE HCL 30 MG PO TABS: 30.0000 mg | ORAL_TABLET | Freq: Four times a day (QID) | ORAL | Status: DC | PRN

## 2012-07-15 MED ORDER — DULOXETINE HCL 30 MG PO CPEP: 30.0000 mg | ORAL_CAPSULE | Freq: Every day | ORAL | Status: DC

## 2012-07-15 NOTE — Assessment & Plan Note (Signed)
discussed nutrition and exercise Diet sheet given

## 2012-07-15 NOTE — Assessment & Plan Note (Signed)
Using E-Cig

## 2012-07-15 NOTE — Assessment & Plan Note (Signed)
Would benefit from Cymbalta Trial, needs to go to [facility]

## 2012-07-15 NOTE — Assessment & Plan Note (Signed)
Will follow up with [facility]

## 2012-07-15 NOTE — Patient Instructions (Signed)
It was nice to see you today.   Today we discussed: 1. Chronic pain syndrome I have started: - DULoxetine (CYMBALTA) 30 MG capsule; Take 1 capsule (30 mg total) by mouth daily.  Dispense: 30 capsule; Refill: 11  TAKE TO THE [facility] ([facility]) to apply for medication help - oxycodone (ROXICODONE) 30 MG immediate release tablet; Take 1 tablet (30 mg total) by mouth every 6 (six) hours as needed for pain.  Dispense: 120 tablet; Refill: 0  2. Obesity  Work on your Diet and Exercise as we discussed:   Here are some basic nutrition rules to remember:  "Eat Real Foods & Drink Real Drinks" - if you think it was made in a factory . . it is likely best to avoid it as a staple in your diet.  Limiting these types of foods to 1-2 times per week is a good idea.  Sticking with fresh fruits and vegetables as well as home cooked meals will typically provide more nutrition and less salt than prepackaged meals.     Limit the amount of sugar sweetened and artificially sweetened foods and beverages.  Sticking with water flavored with a slice of lemon, lime or orange is a great option if you want something with flavor in it.  Using flavored seltzer water to flavor plain water will also add some bite if you want something more than flavor.     Here are 2 of my favorite web sites that provide great nutrition and exercise advice.   www.eats[place]movemore[place].com   www.DisposableNylon.be   3. Mood & Anxiety      Be sure to follow up with [facility]  4. SMOKER  Continue try to work on cutting back!  Remember 1800-Quit-Now!   Please plan to return to see me in 1 month.  If you need anything prior to seeing me please call the clinic.  Please Bring all medications with you to each appointment.

## 2012-07-18 NOTE — Progress Notes (Signed)
  [name]  Patient name: [name] MRN [id number]  Date of birth: Aug 01, 1990  CC & HPI:  Jeffery Baldwin is a 22 y.o. male presenting today for follow up of his chronic pain syndrome 2o/2 nerve root avulsion. Pain is well controlled on current pain regimen.  No side effects for medications.  Is able to function throughout the day at home.  Currently daily duties including helping out with his family members.   Continues to smoke.  Continues to gain weight.  Still feels due to intake and lack of exercise.  Feels he can make adequate changes.  BP improved, and no longer using any form of energy drinks  Still having anxiety.     ROS:  Per history of present illness  Pertinent History Reviewed:  Medical & Surgical Hx:  Reviewed: Significant for nerve root avulsion, tobacco dependence, elevated blood pressure Medications: Reviewed & Updated - see associated section Social History: Reviewed -  reports that he has been smoking Cigarettes.  He has a 3.5 pack-year smoking history. He has never used smokeless tobacco.   Objective Findings:  Vitals: BP 135/88  Pulse 92  Temp(Src) 98.5 F (36.9 C) (Oral)  Ht 5\' 11"  (1.803 m)  Wt 224 lb (101.606 kg)  BMI 31.26 kg/m2  PE: GENERAL:  Adult Caucasian male. In no discomfort; no respiratory distress. PSYCH; good insight, mood anxious.  Multiple stressors at home but reports he is having a difficult time coping with everything going on.   HEART: RRR, S1/S2 heard, no murmur LUNGS: CTA B, no wheezes, no crackles MSK: R arm in sling.  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  Flexure and extensor contracture.  Flaccid in adduction and extension.  Insensate to touch in C6, C7, C8, T1 dermatomes.  UE DTRs absent.  TTP over R trapezius  Assessment & Plan:

## 2012-08-12 ENCOUNTER — Encounter: Payer: Self-pay | Admitting: Sports Medicine

## 2012-08-12 ENCOUNTER — Ambulatory Visit (INDEPENDENT_AMBULATORY_CARE_PROVIDER_SITE_OTHER): Payer: Self-pay | Admitting: Sports Medicine

## 2012-08-12 VITALS — BP 122/86 | HR 111 | Temp 98.3°F | Ht 71.0 in | Wt 212.0 lb

## 2012-08-12 DIAGNOSIS — T148XXA Other injury of unspecified body region, initial encounter: Secondary | ICD-10-CM

## 2012-08-12 DIAGNOSIS — M5410 Radiculopathy, site unspecified: Secondary | ICD-10-CM

## 2012-08-12 DIAGNOSIS — G894 Chronic pain syndrome: Secondary | ICD-10-CM

## 2012-08-12 DIAGNOSIS — R030 Elevated blood-pressure reading, without diagnosis of hypertension: Secondary | ICD-10-CM

## 2012-08-12 DIAGNOSIS — F17200 Nicotine dependence, unspecified, uncomplicated: Secondary | ICD-10-CM

## 2012-08-12 DIAGNOSIS — F4010 Social phobia, unspecified: Secondary | ICD-10-CM

## 2012-08-12 MED ORDER — OXYCODONE HCL 30 MG PO TABS: 30.0000 mg | ORAL_TABLET | Freq: Four times a day (QID) | ORAL | Status: DC | PRN

## 2012-08-13 NOTE — Assessment & Plan Note (Signed)
Plans to fill Cymbalta but has not made it by [facility]

## 2012-08-13 NOTE — Assessment & Plan Note (Signed)
Encouraged to establish with counseling.  Still has handouts.  Willing to try to get to [facility]

## 2012-08-13 NOTE — Assessment & Plan Note (Addendum)
Has currently quit Provided + reinforcement

## 2012-08-13 NOTE — Assessment & Plan Note (Signed)
Needs to continue to be monitored. Low threshold for starting HCTZ vs Chlorthalodone

## 2012-08-13 NOTE — Assessment & Plan Note (Signed)
Discussed giving 2 months supply of meds but currently wanting to keep q 1 month appointments with everything going on

## 2012-08-13 NOTE — Progress Notes (Signed)
  [name]  Patient name: Jeffery Baldwin MRN [id number]  Date of birth: June 14, 1990  CC & HPI:  Jeffery Baldwin is a 22 y.o. male presenting today for follow up of his chronic pain syndrome 2o/2 nerve root avulsion. Pain is well controlled on current pain regimen.  No side effects for medications.  Is able to function throughout the day at home.  Currently daily duties including helping out with his family members.  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  Grandfather is not currently eating.  Recently had uncle pass away from H1N1  Has stopped smoking.  Working on trying to exercise and taking dog for walks when weather allows. Feels he can make adequate changes.  BP stable on recheck, initially elevated to 146/92.  no longer using any form of energy drinks  Still having anxiety.     ROS:  Per history of present illness  Pertinent History Reviewed:  Medical & Surgical Hx:  Reviewed: Significant for nerve root avulsion, tobacco dependence, elevated blood pressure Medications: Reviewed & Updated - see associated section Social History: Reviewed -  reports that he has been smoking Cigarettes.  He has a 3.5 pack-year smoking history. He has never used smokeless tobacco.   Objective Findings:  Vitals: BP 122/86  Pulse 111  Temp(Src) 98.3 F (36.8 C) (Oral)  Ht 5\' 11"  (1.803 m)  Wt 212 lb (96.163 kg)  BMI 29.58 kg/m2  PE: GENERAL:  Adult Caucasian male. In no discomfort; no respiratory distress. PSYCH; good insight, mood anxious.  Multiple stressors at home but reports he is having a difficult time coping with everything going on.   HEART: RRR, S1/S2 heard, no murmur LUNGS: CTA B, no wheezes, no crackles MSK: R arm in sling.  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  Flexure and extensor contracture.  Flaccid in adduction and extension.  Insensate to touch in C6, C7, C8, T1 dermatomes.  UE DTRs absent.  TTP over R trapezius  Assessment &  Plan:

## 2012-08-21 ENCOUNTER — Encounter (HOSPITAL_COMMUNITY): Payer: Self-pay | Admitting: *Deleted

## 2012-08-21 ENCOUNTER — Emergency Department (HOSPITAL_COMMUNITY)
Admission: EM | Admit: 2012-08-21 | Discharge: 2012-08-21 | Disposition: A | Discharge status: Home / Self Care | Payer: Self-pay | Attending: Emergency Medicine | Admitting: Emergency Medicine

## 2012-08-21 DIAGNOSIS — S80811A Abrasion, right lower leg, initial encounter: Secondary | ICD-10-CM

## 2012-08-21 DIAGNOSIS — S81009A Unspecified open wound, unspecified knee, initial encounter: Secondary | ICD-10-CM | POA: Insufficient documentation

## 2012-08-21 DIAGNOSIS — Y9329 Activity, other involving ice and snow: Secondary | ICD-10-CM | POA: Insufficient documentation

## 2012-08-21 DIAGNOSIS — Y92009 Unspecified place in unspecified non-institutional (private) residence as the place of occurrence of the external cause: Secondary | ICD-10-CM | POA: Insufficient documentation

## 2012-08-21 DIAGNOSIS — W010XXA Fall on same level from slipping, tripping and stumbling without subsequent striking against object, initial encounter: Secondary | ICD-10-CM | POA: Insufficient documentation

## 2012-08-21 DIAGNOSIS — Z87828 Personal history of other (healed) physical injury and trauma: Secondary | ICD-10-CM | POA: Insufficient documentation

## 2012-08-21 DIAGNOSIS — S91009A Unspecified open wound, unspecified ankle, initial encounter: Secondary | ICD-10-CM | POA: Insufficient documentation

## 2012-08-21 DIAGNOSIS — S81811A Laceration without foreign body, right lower leg, initial encounter: Secondary | ICD-10-CM

## 2012-08-21 DIAGNOSIS — F17200 Nicotine dependence, unspecified, uncomplicated: Secondary | ICD-10-CM | POA: Insufficient documentation

## 2012-08-21 DIAGNOSIS — Z79899 Other long term (current) drug therapy: Secondary | ICD-10-CM | POA: Insufficient documentation

## 2012-08-21 DIAGNOSIS — IMO0002 Reserved for concepts with insufficient information to code with codable children: Secondary | ICD-10-CM | POA: Insufficient documentation

## 2012-08-21 MED ORDER — OXYCODONE-ACETAMINOPHEN 5-325 MG PO TABS
1.0000 | ORAL_TABLET | Freq: Once | ORAL | Status: AC
  Administered 2012-08-21: 1 via ORAL
  Filled 2012-08-21: qty 1

## 2012-08-21 NOTE — ED Provider Notes (Signed)
History    This chart was scribed for non-physician practitioner working with Raeford Razor, MD by ED Scribe, Burman Nieves. This patient was seen in room TR08C/TR08C and the patient's care was started at 5:06 PM.   CSN: [id number]  Arrival date & time 08/21/12  1541   First MD Initiated Contact with Patient 08/21/12 1706      Chief Complaint  Patient presents with  . Extremity Laceration    (Consider location/radiation/quality/duration/timing/severity/associated sxs/prior treatment) Patient is a 22 y.o. male presenting with fall. The history is provided by the patient. No language interpreter was used.  Fall Impact surface: angle iron. Point of impact: right shin. Pain location: right shin. The pain is at a severity of 7/10. He was ambulatory at the scene. There was no entrapment after the fall. Pertinent negatives include no fever, no nausea, no vomiting, no hematuria and no headaches.   RODGER GIANGREGORIO is a 22 y.o. male who presents to the Emergency Department complaining of moderate constant right shin pain resulting from a fall onset a few hours ago . Pt slipped on ice and fell off the porch and landed on his right shin hitting some angle-iron. Upon impact pt has two physical abrasions and one 1 cm laceration on his right shin. Pt states the pain sensation is sharp and throbbing with some associated knee pain. He gives the pain a 7/10. Pt denies LOC, HI, fever, chills, cough, nausea, vomiting, diarrhea, SOB, weakness, and any other associated symptoms. He states he took some oxycodone pta prescribed to him for a past surgery. Pt recently had nerve root avulsion surgery on his right arm. PCP is Dr. Berline Chough. Pt is Allergic to morphine.    Past Medical History  Diagnosis Date  . Nerve root avulsion     Past Surgical History  Procedure Laterality Date  . Nerve root repair      No family history on file.  History  Substance Use Topics  . Smoking status: Current Every Day Smoker -- 0.20  packs/day for 7 years    Types: Cigarettes  . Smokeless tobacco: Never Used  . Alcohol Use: Yes     Comment: occasional      Review of Systems  Constitutional: Negative for fever and chills.  HENT: Negative for sore throat and rhinorrhea.   Eyes: Negative for visual disturbance.  Respiratory: Negative for cough and shortness of breath.   Cardiovascular: Negative for chest pain.  Gastrointestinal: Negative for nausea, vomiting and diarrhea.  Genitourinary: Negative for dysuria and hematuria.  Musculoskeletal: Negative for back pain.  Skin: Positive for wound.  Neurological: Negative for dizziness, light-headedness and headaches.  Psychiatric/Behavioral: Negative for confusion.    Allergies  Ms contin  Home Medications   Current Outpatient Rx  Name  Route  Sig  Dispense  Refill  . DULoxetine (CYMBALTA) 30 MG capsule   Oral   Take 1 capsule (30 mg total) by mouth daily.   30 capsule   11   . oxycodone (ROXICODONE) 30 MG immediate release tablet   Oral   Take 1 tablet (30 mg total) by mouth every 6 (six) hours as needed for pain.   120 tablet   0     BP 135/67  Pulse 84  Temp(Src) 98.3 F (36.8 C) (Oral)  Resp 18  SpO2 98%  Physical Exam  Nursing note and vitals reviewed. Constitutional: He is oriented to person, place, and time. He appears well-developed and well-nourished. No distress.  HENT:  Head:  Normocephalic and atraumatic.  Eyes: Conjunctivae and EOM are normal. Pupils are equal, round, and reactive to light.  Neck: Neck supple. No tracheal deviation present.  Cardiovascular: Normal rate and regular rhythm.   No murmur heard. Pulmonary/Chest: Effort normal. No respiratory distress. He exhibits no tenderness.  Abdominal: Soft. Bowel sounds are normal. There is no tenderness. There is no guarding.  Musculoskeletal: Normal range of motion. He exhibits tenderness.  Neurological: He is alert and oriented to person, place, and time.  Skin: Skin is warm  and dry. There is erythema.  1 cm horizontal laceration to the mid anterior aspect to his right shin with two abrasion above and below the laceration. No deformity. Right ankle and knee full ROM and non tender.  Psychiatric: He has a normal mood and affect. His behavior is normal.    ED Course  Procedures (including critical care time) DIAGNOSTIC STUDIES: Oxygen Saturation is 98% on room air, normal by my interpretation.    COORDINATION OF CARE: 5:52 PM Discussed ED treatment with pt and pt agrees.   LACERATION REPAIR Performed by: Fayrene Helper Authorized byFayrene Helper Consent: Verbal consent obtained. Risks and benefits: risks, benefits and alternatives were discussed Consent given by: patient Patient identity confirmed: provided demographic data Prepped and Draped in normal sterile fashion Wound explored  Laceration Location: R anterior tibia region, R leg  Laceration Length: 1cm  No Foreign Bodies seen or palpated  Anesthesia: local infiltration  Local anesthetic: lidocaine 2% w epinephrine  Anesthetic total: 3 ml  Irrigation method: syringe Amount of cleaning: standard  Skin closure: prolene 4.0  Number of sutures: 4  Technique: simple interrupted  Patient tolerance: Patient tolerated the procedure well with no immediate complications.    Labs Reviewed - No data to display No results found.   1. Leg laceration, right, initial encounter   2. Leg abrasion, right, initial encounter     BP 135/67  Pulse 84  Temp(Src) 98.3 F (36.8 C) (Oral)  Resp 18  SpO2 98%   MDM  I personally performed the services described in this documentation, which was scribed in my presence. The recorded information has been reviewed and is accurate.         Fayrene Helper, PA-C 08/21/12 1816

## 2012-08-21 NOTE — ED Notes (Signed)
Pt slipped on ice and cut his R shin with a piece of angle-iron.  1 inch lac to R shin with minimal bleeding.  Pt has pressure dressing on it.  Pedal pulse present.  Last tetanus last year.

## 2012-08-22 NOTE — ED Provider Notes (Signed)
Medical screening examination/treatment/procedure(s) were performed by non-physician practitioner and as supervising physician I was immediately available for consultation/collaboration.  Avary Eichenberger, MD 08/22/12 1418 

## 2012-09-13 ENCOUNTER — Encounter: Payer: Self-pay | Admitting: Sports Medicine

## 2012-09-13 ENCOUNTER — Ambulatory Visit (INDEPENDENT_AMBULATORY_CARE_PROVIDER_SITE_OTHER): Payer: Self-pay | Admitting: Sports Medicine

## 2012-09-13 VITALS — BP 130/59 | HR 88 | Temp 98.8°F | Ht 71.0 in | Wt 223.0 lb

## 2012-09-13 DIAGNOSIS — M5410 Radiculopathy, site unspecified: Secondary | ICD-10-CM

## 2012-09-13 DIAGNOSIS — G894 Chronic pain syndrome: Secondary | ICD-10-CM

## 2012-09-13 DIAGNOSIS — T148XXA Other injury of unspecified body region, initial encounter: Secondary | ICD-10-CM

## 2012-09-13 DIAGNOSIS — R030 Elevated blood-pressure reading, without diagnosis of hypertension: Secondary | ICD-10-CM

## 2012-09-13 MED ORDER — DULOXETINE HCL 30 MG PO CPEP: 30.0000 mg | ORAL_CAPSULE | Freq: Every day | ORAL | Status: DC

## 2012-09-13 MED ORDER — OXYCODONE HCL 30 MG PO TABS: 30.0000 mg | ORAL_TABLET | Freq: Four times a day (QID) | ORAL | Status: DC | PRN

## 2012-09-13 NOTE — Assessment & Plan Note (Signed)
Refill pain medicine. Refill Cymbalta to be able to take to [facility]

## 2012-09-13 NOTE — Progress Notes (Signed)
  [name]  Patient name: Jeffery Baldwin MRN [id number]  Date of birth: [birth year]  CC & HPI:  Jeffery Baldwin is a 22 y.o. male presenting today for follow up of his chronic pain syndrome 2o/2 nerve root avulsion. Pain is well controlled on current pain regimen.  No side effects for medications.  Is able to function throughout the day at home.  Currently daily duties including helping out with his family members.  Reports that his stressed home has improved.  His grandfathers doing somewhat better and will be a new patient here to see Dr. Adriana Simas.   Has stopped smoking.  The last admission to improve exercise but has yet to initiate.Feels he can make adequate changes.   Still having anxiety.     ROS:  Per history of present illness  Pertinent History Reviewed:  Medical & Surgical Hx:  Reviewed: Significant for nerve root avulsion, tobacco dependence, elevated blood pressure Medications: Reviewed & Updated - see associated section Social History: Reviewed -  reports that he has been smoking Cigarettes.  He has a 3.5 pack-year smoking history. He has never used smokeless tobacco.   Objective Findings:  Vitals: BP 130/59  Pulse 88  Temp(Src) 98.8 F (37.1 C) (Oral)  Ht 5\' 11"  (1.803 m)  Wt 223 lb (101.152 kg)  BMI 31.12 kg/m2  PE: GENERAL:  Adult Caucasian male. In no discomfort; no respiratory distress. PSYCH; good insight, mood anxious.  Multiple stressors at home but reports he is having a difficult time coping with everything going on.   HEART: RRR, S1/S2 heard, no murmur LUNGS: CTA B, no wheezes, no crackles MSK: R arm in sling.  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  Flexure and extensor contracture.  Flaccid in adduction and extension.  Insensate to touch in C6, C7, C8, T1 dermatomes.  UE DTRs absent.  TTP over R trapezius  Assessment & Plan:

## 2012-09-13 NOTE — Assessment & Plan Note (Signed)
Refill pain medicine.  No concern for diversion. >Consider quantitative drug test at next visit

## 2012-09-13 NOTE — Assessment & Plan Note (Signed)
No evidence of persistent elevated BP today.

## 2012-09-13 NOTE — Patient Instructions (Addendum)
It was nice to see you today.   Today we discussed: 1. Chronic pain syndrome I have refilled your: - DULoxetine (CYMBALTA) 30 MG capsule; Take 1 capsule (30 mg total) by mouth daily.  Dispense: 30 capsule; Refill: 11 Please work on getting this filled.  2. Tobacco abuse:  Good job quitting.    3. Weight Gain:   Here are some basic nutrition rules to remember:  "Eat Real Foods & Drink Real Drinks" - if you think it was made in a factory . . it is likely best to avoid it as a staple in your diet.  Limiting these types of foods to 1-2 times per week is a good idea.  Sticking with fresh fruits and vegetables as well as home cooked meals will typically provide more nutrition and less salt than prepackaged meals.     Limit the amount of sugar sweetened and artificially sweetened foods and beverages.  Sticking with water flavored with a slice of lemon, lime or orange is a great option if you want something with flavor in it.  Using flavored seltzer water to flavor plain water will also add some bite if you want something more than flavor.     Here are 2 of my favorite web sites that provide great nutrition and exercise advice.   www.eatsmartmovemore[place].com   www.DisposableNylon.be   Please plan to return to see me in 1 month.  If you need anything prior to seeing me please call the clinic.  Please Bring all medications with you to each appointment.

## 2012-10-07 ENCOUNTER — Ambulatory Visit (INDEPENDENT_AMBULATORY_CARE_PROVIDER_SITE_OTHER): Payer: Self-pay | Admitting: Sports Medicine

## 2012-10-07 ENCOUNTER — Encounter: Payer: Self-pay | Admitting: Sports Medicine

## 2012-10-07 VITALS — BP 142/72 | HR 93 | Temp 98.7°F | Ht 71.0 in | Wt 229.0 lb

## 2012-10-07 DIAGNOSIS — F4010 Social phobia, unspecified: Secondary | ICD-10-CM

## 2012-10-07 DIAGNOSIS — F17200 Nicotine dependence, unspecified, uncomplicated: Secondary | ICD-10-CM

## 2012-10-07 DIAGNOSIS — E669 Obesity, unspecified: Secondary | ICD-10-CM

## 2012-10-07 DIAGNOSIS — G894 Chronic pain syndrome: Secondary | ICD-10-CM

## 2012-10-07 DIAGNOSIS — R030 Elevated blood-pressure reading, without diagnosis of hypertension: Secondary | ICD-10-CM

## 2012-10-07 MED ORDER — OXYCODONE HCL 30 MG PO TABS: 30.0000 mg | ORAL_TABLET | Freq: Four times a day (QID) | ORAL | Status: DC | PRN

## 2012-10-07 NOTE — Assessment & Plan Note (Signed)
No side effects, doing well Refilled Oxycodone 30mg q6o prn x 120   

## 2012-10-07 NOTE — Assessment & Plan Note (Signed)
Continues to have weight gain.  Reports poor compliance with diet and exercise.  Discussed risk of continued weight gain with beta blocker use although minimal would prefer alternative agent.

## 2012-10-07 NOTE — Patient Instructions (Addendum)
It was nice to see you today.   Today we discussed: 1. Chronic pain syndrome I have refilled you meds  2. Elevated BP Work on diet and exercise as we discussed If you would like to start clonidine to help with you BP, you anxiety and your focus let me know   3. SMOKER Great job cutting back   Please plan to return to see me in 1 month.  If you need anything prior to seeing me please call the clinic.  Please Bring all medications with you to each appointment.

## 2012-10-07 NOTE — Assessment & Plan Note (Addendum)
2 in past month Significantly improved.  Does continue to use nicotine replacement via electronic cigarettes

## 2012-10-07 NOTE — Assessment & Plan Note (Signed)
Lower on recheck. Will continue to monitor.  >> Consider Clonidine if need med tx in future for BP, Anxiety, and potentially to help focus

## 2012-10-07 NOTE — Progress Notes (Signed)
  [name]  Patient name: Jeffery Baldwin MRN [id number]  Date of birth: [birth year]  CC & HPI:  Jeffery Baldwin is a 22 y.o. male presenting today for follow up of his chronic pain syndrome 2o/2 nerve root avulsion. Pain is well controlled on current pain regimen.  No side effects for medications.  Is able to function throughout the day at home.  Currently daily duties including helping out with his family members.     He does continue to have increased stress.  He has tried to enroll in school and having difficulty with the administrative process.  Reports at home has been difficult recently and has not been sleeping well.  Has reverted to using energy drinks on occasion.  Has significantly cut back on his smoking.  Has not been able to increase his activity but he is still wanting to do so.  Still having anxiety especially regarding going back to school   ROS:  Per history of present illness  Pertinent History Reviewed:  Medical & Surgical Hx:  Reviewed: Significant for nerve root avulsion, tobacco dependence, elevated blood pressure Medications: Reviewed & Updated - see associated section Social History: Reviewed -  reports that he has been smoking Cigarettes.  He has a 3.5 pack-year smoking history. He has never used smokeless tobacco.   Objective Findings:  Vitals: BP 142/72  Pulse 93  Temp(Src) 98.7 F (37.1 C) (Oral)  Ht 5\' 11"  (1.803 m)  Wt 229 lb (103.874 kg)  BMI 31.95 kg/m2  PE: GENERAL:  Adult Caucasian male. In mild emotional discomfort; no respiratory distress. PSYCH; good insight, mood anxious, worse than normal.  Multiple stressors at home but reports he is having a difficult time coping with everything going on.  Did have a test to get into school earlier today.  Reports he is very stressful difficulty focusing. HEART: RRR, S1/S2 heard, no murmur LUNGS: CTA B, no wheezes, no crackles MSK: R arm in sling.  Denervation atrophy of hand with  clubbing of fingers present.  Flexure and extensor contracture.  Flaccid in adduction and extension.  Insensate to touch in C6, C7, C8, T1 dermatomes.  UE DTRs absent.  TTP over R trapezius  Assessment & Plan:

## 2012-10-07 NOTE — Assessment & Plan Note (Signed)
Still thank you benefit from counseling however he is not taking initiative to set up outside sources. Discussed additional clonidine his blood pressure medicine and as potential anxiolytic and empiric ADHD treatment.  Patient is not willing to make a change that time.  Could also consider beta blocker given social anxiety disorder but has had some issues with weight gain.

## 2012-11-07 ENCOUNTER — Encounter: Payer: Self-pay | Admitting: Sports Medicine

## 2012-11-07 ENCOUNTER — Ambulatory Visit (INDEPENDENT_AMBULATORY_CARE_PROVIDER_SITE_OTHER): Payer: Self-pay | Admitting: Sports Medicine

## 2012-11-07 VITALS — BP 129/68 | HR 91 | Temp 99.4°F | Ht 71.0 in | Wt 222.0 lb

## 2012-11-07 DIAGNOSIS — M5410 Radiculopathy, site unspecified: Secondary | ICD-10-CM

## 2012-11-07 DIAGNOSIS — T148XXA Other injury of unspecified body region, initial encounter: Secondary | ICD-10-CM

## 2012-11-07 DIAGNOSIS — F4010 Social phobia, unspecified: Secondary | ICD-10-CM

## 2012-11-07 DIAGNOSIS — R030 Elevated blood-pressure reading, without diagnosis of hypertension: Secondary | ICD-10-CM

## 2012-11-07 MED ORDER — OXYCODONE HCL 30 MG PO TABS: 30.0000 mg | ORAL_TABLET | Freq: Four times a day (QID) | ORAL | Status: DC | PRN

## 2012-11-07 NOTE — Assessment & Plan Note (Signed)
No side effects, doing well Refilled Oxycodone 30mg  q6o prn x 120
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
## 2012-11-07 NOTE — Assessment & Plan Note (Signed)
Improved today no changes at this time

## 2012-11-25 ENCOUNTER — Telehealth: Payer: Self-pay | Admitting: Sports Medicine

## 2012-11-25 NOTE — Telephone Encounter (Signed)
PT notified letter is ready for pick up at front desk.  Prescilla Monger, Darlyne Russian, CMA

## 2012-11-25 NOTE — Telephone Encounter (Signed)
Letter written.  Please print and provide to pt when arrives today

## 2012-12-05 ENCOUNTER — Encounter: Payer: Self-pay | Admitting: Sports Medicine

## 2012-12-05 ENCOUNTER — Ambulatory Visit (INDEPENDENT_AMBULATORY_CARE_PROVIDER_SITE_OTHER): Payer: Self-pay | Admitting: Sports Medicine

## 2012-12-05 VITALS — BP 138/76 | HR 89 | Temp 99.0°F | Ht 71.0 in | Wt 216.0 lb

## 2012-12-05 DIAGNOSIS — E669 Obesity, unspecified: Secondary | ICD-10-CM

## 2012-12-05 DIAGNOSIS — T148XXA Other injury of unspecified body region, initial encounter: Secondary | ICD-10-CM

## 2012-12-05 DIAGNOSIS — R030 Elevated blood-pressure reading, without diagnosis of hypertension: Secondary | ICD-10-CM

## 2012-12-05 DIAGNOSIS — M5410 Radiculopathy, site unspecified: Secondary | ICD-10-CM

## 2012-12-05 DIAGNOSIS — F17200 Nicotine dependence, unspecified, uncomplicated: Secondary | ICD-10-CM

## 2012-12-05 MED ORDER — OXYCODONE HCL 30 MG PO TABS: 30.0000 mg | ORAL_TABLET | Freq: Four times a day (QID) | ORAL | Status: DC | PRN

## 2012-12-05 NOTE — Progress Notes (Signed)
  [facility]  Patient name: Jeffery Baldwin MRN [id number]  Date of birth: 12/31/1990  CC & HPI:  Jeffery Baldwin is a 22 y.o. male presenting today for follow up of his chronic pain syndrome 2o/2 nerve root avulsion. Pain is well controlled on current pain regimen.  No side effects from medications.  Is able to function throughout the day at home.  Currently daily duties including helping out with his family members.    Reports to be feeling significantly better.   Cymbalta seems to be helping.  He has been exercising again.  He has been in touch with his stepsister which has been quite refreshing.  There have been some legal disputes with his aunt who is trying to gain medical power of attorney over his grandparents who he is the primary caregiver for.  She has been absent from their care but Jeffery Baldwin believes she has a Naval architect.  ROS:  Per history of present illness  Pertinent History Reviewed:  Medical & Surgical Hx:  Reviewed: Significant for nerve root avulsion, tobacco dependence, elevated blood pressure Medications: Reviewed & Updated - see associated section Social History: Reviewed -  reports that he has been smoking Cigarettes.  He has a 3.5 pack-year smoking history. He has never used smokeless tobacco.   Objective Findings:  Vitals: BP 138/76  Pulse 89  Temp(Src) 99 F (37.2 C) (Oral)  Ht 5\' 11"  (1.803 m)  Wt 216 lb (97.977 kg)  BMI 30.14 kg/m2  PE: GENERAL:  Adult Caucasian male. In mild emotional discomfort; no respiratory distress. PSYCH; significantly improved mood and affect today. MSK: R arm in sling.  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  Flexure and extensor contracture.  Flaccid in adduction and extension.    Assessment & Plan:

## 2012-12-05 NOTE — Patient Instructions (Addendum)
It was nice to see you today.   Today we discussed:   Please plan to return to see me in 1 month.  If you need anything prior to seeing me please call the clinic.  Please Bring all medications with you to each appointment.

## 2012-12-09 NOTE — Assessment & Plan Note (Signed)
Has cut back 

## 2012-12-09 NOTE — Assessment & Plan Note (Signed)
Remains prehypertensive TLC encouraged, increased aerobic activity

## 2012-12-09 NOTE — Assessment & Plan Note (Signed)
Refills provided for chronic meds - oxy 30mg  q6o #120

## 2012-12-09 NOTE — Assessment & Plan Note (Signed)
+   wt loss - increasing activity, watching diet

## 2013-01-03 ENCOUNTER — Encounter: Payer: Self-pay | Admitting: Sports Medicine

## 2013-01-03 ENCOUNTER — Ambulatory Visit (INDEPENDENT_AMBULATORY_CARE_PROVIDER_SITE_OTHER): Payer: Self-pay | Admitting: Sports Medicine

## 2013-01-03 VITALS — BP 135/82 | HR 83 | Temp 98.6°F | Wt 212.4 lb

## 2013-01-03 DIAGNOSIS — T148XXA Other injury of unspecified body region, initial encounter: Secondary | ICD-10-CM

## 2013-01-03 DIAGNOSIS — G894 Chronic pain syndrome: Secondary | ICD-10-CM

## 2013-01-03 DIAGNOSIS — M5410 Radiculopathy, site unspecified: Secondary | ICD-10-CM

## 2013-01-03 DIAGNOSIS — E669 Obesity, unspecified: Secondary | ICD-10-CM

## 2013-01-03 MED ORDER — OXYCODONE HCL 30 MG PO TABS: 30.0000 mg | ORAL_TABLET | Freq: Four times a day (QID) | ORAL | Status: DC | PRN

## 2013-01-03 NOTE — Assessment & Plan Note (Signed)
Has been continuing to be active. Increasing fresh vegetable intake Continue dietary and activity intervention

## 2013-01-03 NOTE — Assessment & Plan Note (Signed)
Pain well controlled with current regimen. Cymbalta is helping. > Consider increasing Cymbalta at next ointment.

## 2013-01-03 NOTE — Progress Notes (Signed)
  [name]  Patient name: Jeffery Baldwin MRN [id number]  Date of birth: 1991/03/24  CC & HPI:  ZAKARY KIMURA is a 22 y.o. male presenting today for follow up of his chronic pain syndrome 2o/2 nerve root avulsion. Pain is well controlled on current pain regimen.  No side effects from medications.  Is able to function throughout the day at home.  Currently daily duties including helping out with his family members.    Patient does report increased stress at home after his grandmother has been admitted to the hospital earlier this week.  He has remained primary caregiver at home in spite of his aunt having medical power of attorney.   ROS:  Per history of present illness  Pertinent History Reviewed:  Medical & Surgical Hx:  Reviewed: Significant for nerve root avulsion, tobacco dependence, elevated blood pressure Medications: Reviewed & Updated - see associated section Social History: Reviewed -  reports that he has been smoking Cigarettes.  He has a 3.5 pack-year smoking history. He has never used smokeless tobacco.   Objective Findings:  Vitals: BP 135/82  Pulse 83  Temp(Src) 98.6 F (37 C) (Oral)  Wt 212 lb 6.4 oz (96.344 kg)  BMI 29.64 kg/m2  PE: GENERAL:  Adult Caucasian male. In mild emotional discomfort; no respiratory distress. PSYCH; significantly improved mood and affect today. MSK: R arm in sling.  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  Flexure and extensor contracture.  Flaccid in adduction and extension.    Assessment & Plan:

## 2013-01-03 NOTE — Patient Instructions (Addendum)
It was nice to see you today.   Today we discussed: 1. Chronic pain syndrome I have refilled your meds - oxycodone (ROXICODONE) 30 MG immediate release tablet; Take 1 tablet (30 mg total) by mouth every 6 (six) hours as needed for pain.  Dispense: 120 tablet; Refill: 0   Please plan to return to see me in 1 month.  If you need anything prior to seeing me please call the clinic.  Please Bring all medications with you to each appointment.

## 2013-01-03 NOTE — Assessment & Plan Note (Signed)
Refill chronic pain medications, oxycodone 30 mg every 6 hours #120

## 2013-01-22 ENCOUNTER — Emergency Department (HOSPITAL_COMMUNITY): Payer: Self-pay

## 2013-01-22 ENCOUNTER — Ambulatory Visit (INDEPENDENT_AMBULATORY_CARE_PROVIDER_SITE_OTHER): Payer: Self-pay | Admitting: Sports Medicine

## 2013-01-22 ENCOUNTER — Encounter: Payer: Self-pay | Admitting: Sports Medicine

## 2013-01-22 ENCOUNTER — Emergency Department (HOSPITAL_COMMUNITY)
Admission: EM | Admit: 2013-01-22 | Discharge: 2013-01-22 | Disposition: A | Discharge status: Home / Self Care | Payer: Self-pay | Attending: Emergency Medicine | Admitting: Emergency Medicine

## 2013-01-22 ENCOUNTER — Encounter (HOSPITAL_COMMUNITY): Payer: Self-pay | Admitting: Emergency Medicine

## 2013-01-22 VITALS — BP 120/70 | HR 75 | Temp 98.5°F | Ht 71.0 in | Wt 203.8 lb

## 2013-01-22 DIAGNOSIS — Y929 Unspecified place or not applicable: Secondary | ICD-10-CM | POA: Insufficient documentation

## 2013-01-22 DIAGNOSIS — G894 Chronic pain syndrome: Secondary | ICD-10-CM

## 2013-01-22 DIAGNOSIS — W010XXA Fall on same level from slipping, tripping and stumbling without subsequent striking against object, initial encounter: Secondary | ICD-10-CM | POA: Insufficient documentation

## 2013-01-22 DIAGNOSIS — Z79899 Other long term (current) drug therapy: Secondary | ICD-10-CM | POA: Insufficient documentation

## 2013-01-22 DIAGNOSIS — F17200 Nicotine dependence, unspecified, uncomplicated: Secondary | ICD-10-CM | POA: Insufficient documentation

## 2013-01-22 DIAGNOSIS — F4010 Social phobia, unspecified: Secondary | ICD-10-CM

## 2013-01-22 DIAGNOSIS — M5412 Radiculopathy, cervical region: Secondary | ICD-10-CM

## 2013-01-22 DIAGNOSIS — S42293A Other displaced fracture of upper end of unspecified humerus, initial encounter for closed fracture: Secondary | ICD-10-CM | POA: Insufficient documentation

## 2013-01-22 DIAGNOSIS — S42291A Other displaced fracture of upper end of right humerus, initial encounter for closed fracture: Secondary | ICD-10-CM

## 2013-01-22 DIAGNOSIS — Y9389 Activity, other specified: Secondary | ICD-10-CM | POA: Insufficient documentation

## 2013-01-22 MED ORDER — OXYCODONE HCL 30 MG PO TABS: 30.0000 mg | ORAL_TABLET | Freq: Four times a day (QID) | ORAL | Status: DC | PRN

## 2013-01-22 NOTE — Assessment & Plan Note (Signed)
Increase Cymbalta to 2 tablets per day.  Consider getting 60mg  tablets it tolerating.

## 2013-01-22 NOTE — ED Notes (Signed)
PT. FELL WHILE USING HIS SKATEBOARD TODAY REPORTS PAIN / SWELLING AT RIGHT SHOULDER , NO LOC / AMBULATORY.

## 2013-01-22 NOTE — Assessment & Plan Note (Signed)
Pt going out of town due to family circumstances for >2 weeks.  Will provide refill of pain meds but your next refill will NOT BE due until Sept 30.

## 2013-01-22 NOTE — Assessment & Plan Note (Signed)
Increase Cymbalta today Continue to exercise and go to counseling when able

## 2013-01-22 NOTE — Patient Instructions (Signed)
It was nice to see you today, thanks for coming in!  Problem List Items Addressed This Visit   Social anxiety disorder     Increase Cymbalta today Continue to exercise and go to counseling when able    Chronic pain syndrome - Primary     Increase Cymbalta to 2 tablets per day.  Consider getting 60mg  tablets it tolerating.     Relevant Medications      oxycodone (ROXICODONE) immediate release tablet   Cervical nerve root disorder     Pt going out of town due to family circumstances for >2 weeks.  Will provide refill of pain meds but your next refill will NOT BE due until Sept 30.    Relevant Medications      oxycodone (ROXICODONE) immediate release tablet    Other Visit Diagnoses   Nerve root avulsion        Relevant Medications       oxycodone (ROXICODONE) immediate release tablet        Please plan to return to see me at the end of September.  If you need anything prior to your next visit please call the clinic.  Please Bring all medications or accurate medication list with you to each appointment; an accurate medication list is essential in providing you the best care possible.

## 2013-01-22 NOTE — ED Provider Notes (Signed)
CSN: [id number]     Arrival date & time 01/22/13  1932 History    This chart was scribed for a non-physician practitioner, [name], PA-C, working with Enid Skeens, MD by Frederik Pear, ED Scribe. This patient was seen in room TR06C/TR06C and the patient's care was started at 1952.   First MD Initiated Contact with Patient 01/22/13 1952     Chief Complaint  Patient presents with  . Shoulder Injury   (Consider location/radiation/quality/duration/timing/severity/associated sxs/prior Treatment) Patient is a 22 y.o. male presenting with shoulder injury. The history is provided by the patient and medical records. No language interpreter was used.  Shoulder Injury This is a new problem. The problem occurs constantly. The problem has not changed since onset.Exacerbated by: movement. Relieved by: immobilizing the arm. Treatments tried: immobilizing the arm. The treatment provided significant relief.   HPI Comments: Jeffery Baldwin is a 22 y.o. male with a h/o of cervical nerve root avulsion and a surgical h/o of a nerve root repair who presents to the Emergency Department complaining of a right shoulder injury  that began at 17:30 when he tripped and fell over a skateboard and landed on his right shoulder. 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 In the ED, he complains of constant, sharp, stabbing right shoulder pain that is aggravated with movement and alleviated by immobilizing the area that began at 19:30. He treated the area by immobilizing it with a sling from a previous injury.   Past Medical History  Diagnosis Date  . Nerve root avulsion    Past Surgical History  Procedure Laterality Date  . Nerve root repair     No family history on file. History  Substance Use Topics  . Smoking status: Current Every Day Smoker -- 0.20 packs/day for 7 years    Types: Cigarettes  . Smokeless tobacco: Never Used  . Alcohol Use: Yes     Comment: occasional    Review of Systems  Musculoskeletal: Positive  for arthralgias (right shoulder).  All other systems reviewed and are negative.   Allergies  Ms contin  Home Medications   Current Outpatient Rx  Name  Route  Sig  Dispense  Refill  . DULoxetine (CYMBALTA) 30 MG capsule   Oral   Take 60 mg by mouth daily.         Marland Kitchen oxycodone (ROXICODONE) 30 MG immediate release tablet   Oral   Take 1 tablet (30 mg total) by mouth every 6 (six) hours as needed for pain.   120 tablet   0    BP 110/58  Pulse 82  Temp(Src) 99.2 F (37.3 C) (Oral)  Resp 16  Ht 5\' 11"  (1.803 m)  Wt 200 lb (90.719 kg)  BMI 27.91 kg/m2  SpO2 97%  Physical Exam  Nursing note and vitals reviewed. Constitutional: He is oriented to person, place, and time. He appears well-developed and well-nourished. No distress.  HENT:  Head: Normocephalic and atraumatic.  Eyes: Conjunctivae and EOM are normal. No scleral icterus.  Neck: Normal range of motion.  Cardiovascular: Normal rate, regular rhythm and intact distal pulses.   Distal radial pulses are 2+. Capillary refill is normal.  Pulmonary/Chest: Effort normal. No respiratory distress.  Musculoskeletal: He exhibits tenderness.       Right shoulder: He exhibits decreased range of motion, tenderness, bony tenderness and pain. He exhibits no swelling, no effusion, no deformity, no spasm and normal pulse.       Cervical back: Normal.  Right upper arm: Normal.  Well-healed surgical scar over the right anterior shoulder. Decreased ROM secondary to pain. Tender over the anterior shoulder.   Neurological: He is alert and oriented to person, place, and time.  Sensation is at baseline.   Skin: Skin is warm and dry. No rash noted. He is not diaphoretic. No erythema. No pallor.  Psychiatric: He has a normal mood and affect. His behavior is normal.   ED Course   Procedures (including critical care time)  DIAGNOSTIC STUDIES: Oxygen Saturation is 97% on room air, normal by my interpretation.    COORDINATION OF  CARE:  20:38- 1-view right shoulder X-ray independently read by radiologist and independently reviewed by [name], PA-C. Discussed the impression with the pt.Discussed planned course of treatment for discharge with the patient, including a referral to an orthopedist and continuing his current treatment plan, who is agreeable at this time.   Dg Shoulder Right  01/22/2013   *RADIOLOGY REPORT*  Clinical Data: Shoulder injury post fall  RIGHT SHOULDER - 2+ VIEW  Comparison: 06/06/2009.  Findings: Three views of the right shoulder submitted.  There is minimal displaced fracture of the right humeral head.  IMPRESSION: Minimal displaced fracture of the right humeral head.   Original Report Authenticated By: Natasha Mead, M.D.   Labs Reviewed - No data to display No results found.  1. Humeral head fracture, right, closed, initial encounter    MDM  Uncomplicated humeral head fracture secondary to mechanical fall - patient well and nontoxic appearing and hemodynamically stable. He is neurovascularly at baseline. Physical exam findings as above. DG right shoulder significant for minimally displaced fracture of the right humeral head. Patient already has sling immobilizer for right upper extremity secondary to nerve root repair for cervical nerve root avulsion. Have advised the patient to continue using this sling to maintain immobilization. He was also prescribed 120 tabs 30 mg oxycodone today by his primary care provider. Do not believe additional pain medication is indicated for injury. Patient appropriate for discharge with orthopedic follow up for further evaluation of symptoms. Indications for ED return discussed inpatient agreeable to plan with no unaddressed concerns.  I personally performed the services described in this documentation, which was scribed in my presence. The recorded information has been reviewed and is accurate.     [name], PA-C 01/25/13 1444

## 2013-01-23 ENCOUNTER — Telehealth: Payer: Self-pay | Admitting: Sports Medicine

## 2013-01-23 NOTE — Telephone Encounter (Signed)
Pt called because he fell last night and went to the emergency room. He broke his shoulder. They referred him to a ortho doctor but that doctor is too expensive and he would like a different referral. He also has a F/U with Dr. Berline Chough on 8/27. JW

## 2013-01-24 NOTE — Telephone Encounter (Signed)
Call the left a voicemail on patient's cell phone. Given the minimal displacement of the humerus he will likely only need immobilization of this arm which he already does at baseline.  He is not eligible to be seen by orthopedics through his orange card.  He has a followup with me later next week and I will plan to reassess him at that time.  Can consider CT with 3-D reconstruction to assess for displacement.  If not significantly displaced will be able to continue immobilization alone.  If significantly displaced will absolutely need to be seen by orthopedics.

## 2013-01-25 ENCOUNTER — Emergency Department (HOSPITAL_COMMUNITY): Payer: Self-pay

## 2013-01-25 ENCOUNTER — Emergency Department (HOSPITAL_COMMUNITY)
Admission: EM | Admit: 2013-01-25 | Discharge: 2013-01-25 | Disposition: A | Discharge status: Home / Self Care | Payer: Self-pay | Attending: Emergency Medicine | Admitting: Emergency Medicine

## 2013-01-25 ENCOUNTER — Encounter (HOSPITAL_COMMUNITY): Payer: Self-pay | Admitting: Emergency Medicine

## 2013-01-25 DIAGNOSIS — S42293A Other displaced fracture of upper end of unspecified humerus, initial encounter for closed fracture: Secondary | ICD-10-CM | POA: Insufficient documentation

## 2013-01-25 DIAGNOSIS — F17200 Nicotine dependence, unspecified, uncomplicated: Secondary | ICD-10-CM | POA: Insufficient documentation

## 2013-01-25 DIAGNOSIS — S42291D Other displaced fracture of upper end of right humerus, subsequent encounter for fracture with routine healing: Secondary | ICD-10-CM

## 2013-01-25 DIAGNOSIS — Y929 Unspecified place or not applicable: Secondary | ICD-10-CM | POA: Insufficient documentation

## 2013-01-25 DIAGNOSIS — Z87828 Personal history of other (healed) physical injury and trauma: Secondary | ICD-10-CM | POA: Insufficient documentation

## 2013-01-25 DIAGNOSIS — Y9321 Activity, ice skating: Secondary | ICD-10-CM | POA: Insufficient documentation

## 2013-01-25 DIAGNOSIS — Z79899 Other long term (current) drug therapy: Secondary | ICD-10-CM | POA: Insufficient documentation

## 2013-01-25 MED ORDER — HYDROMORPHONE HCL PF 1 MG/ML IJ SOLN
1.0000 mg | Freq: Once | INTRAMUSCULAR | Status: AC
  Administered 2013-01-25: 1 mg via INTRAMUSCULAR
  Filled 2013-01-25: qty 1

## 2013-01-25 MED ORDER — KETOROLAC TROMETHAMINE 30 MG/ML IJ SOLN
30.0000 mg | Freq: Once | INTRAMUSCULAR | Status: AC
Start: 2013-01-25 — End: 2013-01-25
  Administered 2013-01-25: 30 mg via INTRAMUSCULAR
  Filled 2013-01-25: qty 1

## 2013-01-25 NOTE — ED Notes (Signed)
Pt. Spoke with PA. Afterwards he left without his D/C  Paperwork despite attempts to get him to stay. He stated he was ok and he didn't need it. Pt. Then exited the unit.

## 2013-01-25 NOTE — ED Notes (Signed)
Pt questioning time til xray. Spoke with xray and they state that they will be here shortly for pt.

## 2013-01-25 NOTE — ED Notes (Signed)
Pt reports continues to have right shoulder pain since Fall 01/22/2013. Pt tried roxycodone today without relief. Pt has appointment with ortho Dr Thursday. Pt presents with bruising to upper right arm.

## 2013-01-25 NOTE — ED Provider Notes (Signed)
CSN: [id number]     Arrival date & time 01/25/13  1636 History   This chart was scribed for non-physician practitioner working with Jeffery Donath R. Rubin Payor, MD by Leone [facility], ED Scribe. This patient was seen in room TR10C/TR10C and the patient's care was started at 1636.     Chief Complaint  Patient presents with  . Shoulder Pain   The history is provided by the patient. No language interpreter was used.   HPI Comments: Jeffery Baldwin is a 22 y.o. male who presents to the Emergency Department complaining of constant, worsened right shoulder pain with associated swelling and bruising from an injury on 01/22/13.  Pt fell off a skateboard and landed on his right shoulder.  He has used ice packs and 30mg  oxycodone with minimal relief.  Pt denies any new injuries, falls, or trauma.  He denies any new numbness or weakness.  Pt has orthopedic appointment next Thursday 01/30/13.   Past Medical History  Diagnosis Date  . Nerve root avulsion    Past Surgical History  Procedure Laterality Date  . Nerve root repair     No family history on file. History  Substance Use Topics  . Smoking status: Current Every Day Smoker -- 0.20 packs/day for 7 years    Types: Cigarettes  . Smokeless tobacco: Never Used  . Alcohol Use: Yes     Comment: occasional    Review of Systems  Musculoskeletal: Positive for myalgias (Right upper extremity) and arthralgias (Right shoulder).  Neurological: Negative for weakness and numbness.  All other systems reviewed and are negative.   Allergies  Ms contin  Home Medications   Current Outpatient Rx  Name  Route  Sig  Dispense  Refill  . DULoxetine (CYMBALTA) 30 MG capsule   Oral   Take 60 mg by mouth daily.         Marland Kitchen oxycodone (ROXICODONE) 30 MG immediate release tablet   Oral   Take 1 tablet (30 mg total) by mouth every 6 (six) hours as needed for pain.   120 tablet   0    BP 130/77  Pulse 97  Temp(Src) 98.4 F (36.9 C) (Oral)  Resp 20  Ht 5\' 11"   (1.803 m)  Wt 200 lb (90.719 kg)  BMI 27.91 kg/m2  SpO2 100%  Physical Exam  Nursing note and vitals reviewed. Constitutional: He is oriented to person, place, and time. He appears well-developed and well-nourished. No distress.  HENT:  Head: Normocephalic and atraumatic.  Eyes: Conjunctivae and EOM are normal. No scleral icterus.  Neck: Normal range of motion.  Cardiovascular: Normal rate, regular rhythm and intact distal pulses.   Distal radial pulses 2+. Capillary refill normal.  Pulmonary/Chest: Effort normal. No respiratory distress.  Musculoskeletal: Normal range of motion.  Edema to RUE appreciated extending from R shoulder to R elbow; compartments tense. Hematoma of RUE appreciated distal to shoulder extending just distal to R elbow. ROM at baseline and unchanged from 3 days ago.  Neurological: He is alert and oriented to person, place, and time.  Sensory and motor function at baseline, per patient; unchanged from prior exam on 01/22/13  Skin: Skin is warm and dry. No rash noted. He is not diaphoretic. No erythema. No pallor.  Capillary refills normal. No pallor or poikilothermia.  Psychiatric: He has a normal mood and affect. His behavior is normal.    ED Course  DIAGNOSTIC STUDIES: Oxygen Saturation is 100% on room air, normal by my interpretation.  COORDINATION OF CARE: 5:39 PM- Dr. Rubin Baldwin will evaluate patient.  5:48 PM-Dr. Rubin Baldwin at bedside. Discussed treatment plan which includes imaging of the right shoulder with pt at bedside and pt agreed to plan.   Procedures (including critical care time)  Labs Reviewed - No data to display Dg Shoulder Right  01/25/2013   *RADIOLOGY REPORT*  Clinical Data: Increasing right shoulder pain and swelling in patient with known right humeral head fracture, diagnosed after a fall 3 days ago.  RIGHT SHOULDER - 2+ VIEW  Comparison: Right shoulder x-rays 01/22/2013, 06/06/2009.  Findings: Interval development of a large joint  effusion or hemarthrosis, as there is marked widening of the subacromial space and slight inferior positioning of the humeral head within the glenoid.  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  Acromioclavicular joint intact.  No new fractures.  IMPRESSION:  1.  Interval development of a large joint effusion or hemarthrosis since the examination 3 days ago. 2.  Mildly impacted humeral head fracture as noted on the examination 3 days ago.  This is likely pathologic, as there is marked osteopenia involving the humeral head, a finding that has been present back to 2011.   Original Report Authenticated By: Hulan Saas, M.D.   1. Fracture of humeral head, right, closed, with routine healing, subsequent encounter    MDM  Patient presents for worsening pain to R shoulder after mechanical fall 2/2 tripping over skateboard on 01/22/13. Patient with no neurovascular changes since last seen. Mild edema to RUE with more tense compartments; hematoma appreciated. There is no pallor, pulselessness, poikilothermia, or paresthesias on exam. Xray obtained which shows new joint effusion or hemarthrosis to R shoulder joint. No new or worsening osteopenia to humeral head since prior imaging; findings stable since 2011. Patient's pain managed in ED with IM Dilaudid and IM Toradol. Have reviewed the findings with the patient who verbalizes understanding. Have also explained to the patient the need to continue with immobilization and ice. Patient prescribed 30mg  oxycodone q 6 hours by his PCP; do not feel comfortable increasing his pain medicine. Recommended patient contact his PCP regarding changes to his current pain medication regimen. Signs of compartment syndrome and indications for ED return discussed to which patient verbalizes understanding. Appropriate for d/c with orthopedic follow up as advised at prior visit. Patient seen and evaluated also by Dr. Rubin Baldwin who  is in agreement with this work up, assessment, management plan, and patient's stability for d/c.  I personally performed the services described in this documentation, which was scribed in my presence. The recorded information has been reviewed and is accurate.     [name], PA-C 01/25/13 2342

## 2013-01-25 NOTE — ED Provider Notes (Signed)
Medical screening examination/treatment/procedure(s) were conducted as a shared visit with non-physician practitioner(s) and myself.  I personally evaluated the patient during the encounter. Patient with increased pain or shoulder. Has likely hematoma. Has chronic pain. Given pain meds here but outpatient meds would need to be followed by the provider of  his regimen  Juliet Rude. Rubin Payor, MD 01/25/13 2356

## 2013-01-26 NOTE — Progress Notes (Signed)
  [name]  Patient name: Jeffery Baldwin MRN [id number]  Date of birth: 03/03/1991  CC & HPI:  Jeffery Baldwin is a 22 y.o. male presenting today for follow up of his chronic pain syndrome 2o/2 nerve root avulsion. Pain is well controlled on current pain regimen.  No side effects from medications.  Is able to function throughout the day at home.  Has been able to continue normal functioning at home.  Unfortunately his grandfather has been on hospice and has recently passed away.  The funeral is today and he plans to go out of town following the funeral with his grandmother to "get away from at all".  He is requesting an early refill on his pain medications due to being out of town.  Reports he has been doing well on the Cymbalta and is not experiencing any side effects.  He is exercising on an almost regular basis.   ROS:  Per history of present illness  Pertinent History Reviewed:  Medical & Surgical Hx:  Reviewed: Significant for nerve root avulsion, tobacco dependence, elevated blood pressure Medications: Reviewed & Updated - see associated section Social History: Reviewed -  reports that he has been smoking Cigarettes.  He has a 3.5 pack-year smoking history. He has never used smokeless tobacco.   Objective Findings:  Vitals: BP 120/70  Pulse 75  Temp(Src) 98.5 F (36.9 C) (Oral)  Ht 5\' 11"  (1.803 m)  Wt 203 lb 12.8 oz (92.443 kg)  BMI 28.44 kg/m2  PE: GENERAL:  Adult Caucasian male. In mild emotional discomfort; no respiratory distress. PSYCH; significantly improved mood and affect today. MSK: R arm in sling.  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  Flexure and extensor contracture.  Flaccid in adduction and extension.    Assessment & Plan:

## 2013-01-26 NOTE — ED Provider Notes (Signed)
Medical screening examination/treatment/procedure(s) were performed by non-physician practitioner and as supervising physician I was immediately available for consultation/collaboration.   Enid Skeens, MD 01/26/13 [phone]

## 2013-01-29 ENCOUNTER — Encounter: Payer: Self-pay | Admitting: Sports Medicine

## 2013-01-29 ENCOUNTER — Encounter (HOSPITAL_COMMUNITY): Payer: Self-pay | Admitting: Unknown Physician Specialty

## 2013-01-29 ENCOUNTER — Ambulatory Visit (INDEPENDENT_AMBULATORY_CARE_PROVIDER_SITE_OTHER): Payer: Self-pay | Admitting: Sports Medicine

## 2013-01-29 ENCOUNTER — Telehealth: Payer: Self-pay | Admitting: Sports Medicine

## 2013-01-29 ENCOUNTER — Ambulatory Visit (HOSPITAL_COMMUNITY)
Admission: RE | Admit: 2013-01-29 | Discharge: 2013-01-29 | Disposition: A | Discharge status: Home / Self Care | Payer: Self-pay | Source: Ambulatory Visit | Attending: Family Medicine | Admitting: Family Medicine

## 2013-01-29 ENCOUNTER — Ambulatory Visit (HOSPITAL_COMMUNITY)
Admission: RE | Admit: 2013-01-29 | Discharge: 2013-01-29 | Disposition: A | Discharge status: Home / Self Care | Payer: Self-pay | Source: Ambulatory Visit | Attending: Sports Medicine | Admitting: Sports Medicine

## 2013-01-29 VITALS — BP 119/72 | HR 98 | Temp 98.8°F | Ht 71.0 in | Wt 203.0 lb

## 2013-01-29 DIAGNOSIS — M25539 Pain in unspecified wrist: Secondary | ICD-10-CM | POA: Insufficient documentation

## 2013-01-29 DIAGNOSIS — M25429 Effusion, unspecified elbow: Secondary | ICD-10-CM | POA: Insufficient documentation

## 2013-01-29 DIAGNOSIS — M25529 Pain in unspecified elbow: Secondary | ICD-10-CM | POA: Insufficient documentation

## 2013-01-29 DIAGNOSIS — M25421 Effusion, right elbow: Secondary | ICD-10-CM

## 2013-01-29 DIAGNOSIS — M5412 Radiculopathy, cervical region: Secondary | ICD-10-CM

## 2013-01-29 DIAGNOSIS — M7989 Other specified soft tissue disorders: Secondary | ICD-10-CM

## 2013-01-29 DIAGNOSIS — S42209A Unspecified fracture of upper end of unspecified humerus, initial encounter for closed fracture: Secondary | ICD-10-CM | POA: Insufficient documentation

## 2013-01-29 DIAGNOSIS — X58XXXA Exposure to other specified factors, initial encounter: Secondary | ICD-10-CM | POA: Insufficient documentation

## 2013-01-29 DIAGNOSIS — M899 Disorder of bone, unspecified: Secondary | ICD-10-CM | POA: Insufficient documentation

## 2013-01-29 DIAGNOSIS — M79609 Pain in unspecified limb: Secondary | ICD-10-CM

## 2013-01-29 DIAGNOSIS — Z9181 History of falling: Secondary | ICD-10-CM | POA: Insufficient documentation

## 2013-01-29 NOTE — Assessment & Plan Note (Addendum)
I discussed this with multiple orthopedics.  Given lack of displacement this is a nonsurgical condition and a sling should suffice for healing.  Could consider CT with 3-D reconstruction if ongoing concerns.  Marked osteopenia noted on x-rays likely due from the dennervation of the upper extremity.
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
## 2013-01-29 NOTE — Assessment & Plan Note (Addendum)
Given diffuse ecchymosis, swelling and insensate due to C6-C7 nerve root avulsion patient needs further imaging of his entire arm including wrist as this is markedly enlarged compared to the left.  We'll also obtain upper extremity Dopplers to evaluate for DVT on the right.  He's been encouraged to elevate his arm.  I will call him with the results of the x-rays and Doppler.

## 2013-01-29 NOTE — Telephone Encounter (Signed)
Called and discussed results with pt regarding further X-rays and venous doppler.  X-rays reassuring for no further fracture.  Will need repeat X-ray of R humerus prior to scheduled follow up appointment with me on Sept 5th.  Appointment at 0945.  Needs to be at X-ray at 830.    Please call and touch base with pt regarding appointment time and plans for x-ray.

## 2013-01-29 NOTE — Progress Notes (Signed)
Right upper extremity venous duplex:  No evidence of DVT or superficial thrombosis.    

## 2013-01-29 NOTE — Assessment & Plan Note (Signed)
Okay to increase chronic pain medications at this time to 30 mg by mouth every 4 hours.  I am willing to give him in early refill given the circumstances but have discussed this will not be an event that should increases chronic pain medications and I will not be going up in the future.

## 2013-01-29 NOTE — Patient Instructions (Signed)
Go to the radiology department for x-rays and to the vascular lab for your Doppler.  I will call you with results.

## 2013-01-30 NOTE — Telephone Encounter (Signed)
Spoke with patient and informed him of below. He agreed to go to x-ray at 8:30am prior to appointment

## 2013-02-07 ENCOUNTER — Ambulatory Visit (INDEPENDENT_AMBULATORY_CARE_PROVIDER_SITE_OTHER): Payer: Self-pay | Admitting: Sports Medicine

## 2013-02-07 VITALS — BP 114/70 | HR 83 | Temp 99.0°F | Ht 71.0 in | Wt 197.0 lb

## 2013-02-07 DIAGNOSIS — M5410 Radiculopathy, site unspecified: Secondary | ICD-10-CM

## 2013-02-07 DIAGNOSIS — S42209A Unspecified fracture of upper end of unspecified humerus, initial encounter for closed fracture: Secondary | ICD-10-CM

## 2013-02-07 DIAGNOSIS — G894 Chronic pain syndrome: Secondary | ICD-10-CM

## 2013-02-07 DIAGNOSIS — T148XXA Other injury of unspecified body region, initial encounter: Secondary | ICD-10-CM

## 2013-02-07 MED ORDER — OXYCODONE HCL 30 MG PO TABS: 30.0000 mg | ORAL_TABLET | Freq: Four times a day (QID) | ORAL | Status: DC | PRN

## 2013-02-07 NOTE — Assessment & Plan Note (Signed)
Pt did not get X-rays prior to visit.  Will need to have these obtained.  Concern for unstable fracture given fulcrum test findings.  If the X-rays show good callus formation and no further displacement can continue on path.  Otherwise he will need to be seen by orthopedics for surgical consideration given high likelihood of non-union due to paresis of RUE from complete C6/C7 nerve root avulsion.   > Consider  CT scan with 3D reconstruction if what to do from x-ray

## 2013-02-07 NOTE — Progress Notes (Signed)
  [name]  Patient name: Jeffery Baldwin MRN [id number]  Date of birth: [birth year]  CC & HPI:  Jeffery Baldwin is a 22 y.o. male presenting to clinic.  Chief Complaint  Patient presents with  . Follow-up   Jeffery Baldwin is here to follow up for his right humeral head of reports he has been continuing to have significant pain.  Any time he seems to move the arm and feels like it is shifting on him.  He has some crepitations.  He is unable to work followup with orthopedics due to orange card and no income.  He is hoping to avoid surgery.  He has continued to use his chronic pain medications slightly more than originally prescribed was given the okay by me at the last office visit to take up to 2 additional tablets per day however he would be limited to only one additional prescription for the acute episode.   ROS:  PER HPI  Pertinent History Reviewed:  Medical & Surgical Hx:  Reviewed: Significant for C6-C7 nerve root avulsion paralysis and flaccidity of the right upper extremity.   Medications: Reviewed & Updated - see associated section Social History: Reviewed -  reports that he has been smoking Cigarettes.  He has a 1.4 pack-year smoking history. He has never used smokeless tobacco.  Objective Findings:  Vitals: BP 114/70  Pulse 83  Temp(Src) 99 F (37.2 C) (Oral)  Ht 5\' 11"  (1.803 m)  Wt 197 lb (89.359 kg)  BMI 27.49 kg/m2 PE: GENERAL:  Caucasian   male. In no discomfort; no respiratory distress  PSYCH:  alert and appropriate, good insight   HNEENT:    CARDIO:    LUNGS:    ABDOMEN:    EXTREM:   GU:   SKIN:   NEUROMSK:  RUE in sling, pain with humeral palpation.  Questionable shift with  humeral fulcrum test, + for pain.  Good capillary refill, remarkably improved swelling and ecchymosis compared to last visit but still present    Assessment & Plan:   1. Closed fracture of unspecified part of upper end of humerus   2. Chronic pain syndrome   3. Nerve  root avulsion    See problem associated charting

## 2013-02-07 NOTE — Assessment & Plan Note (Signed)
Given acute episode I have provided 1 additional prescription for his pain medication.  Today's prescription + his prior prescriptions will need to last until he is due for his next refill in 30 days

## 2013-03-06 ENCOUNTER — Ambulatory Visit: Payer: Self-pay | Admitting: Sports Medicine

## 2013-03-10 ENCOUNTER — Encounter: Payer: Self-pay | Admitting: Sports Medicine

## 2013-03-10 ENCOUNTER — Ambulatory Visit (INDEPENDENT_AMBULATORY_CARE_PROVIDER_SITE_OTHER): Payer: Self-pay | Admitting: Sports Medicine

## 2013-03-10 VITALS — BP 149/83 | HR 91 | Temp 98.2°F | Ht 71.0 in | Wt 202.6 lb

## 2013-03-10 DIAGNOSIS — G894 Chronic pain syndrome: Secondary | ICD-10-CM

## 2013-03-10 DIAGNOSIS — S42209A Unspecified fracture of upper end of unspecified humerus, initial encounter for closed fracture: Secondary | ICD-10-CM

## 2013-03-10 DIAGNOSIS — R030 Elevated blood-pressure reading, without diagnosis of hypertension: Secondary | ICD-10-CM

## 2013-03-10 DIAGNOSIS — F17200 Nicotine dependence, unspecified, uncomplicated: Secondary | ICD-10-CM

## 2013-03-10 MED ORDER — OXYCODONE HCL 30 MG PO TABS: 30.0000 mg | ORAL_TABLET | Freq: Four times a day (QID) | ORAL | Status: DC | PRN

## 2013-03-10 NOTE — Patient Instructions (Signed)
It was nice to see you today, thanks for coming in!  Refilled pain meds, back on prior regimen  GO GET THE X-RAY  STOP SMOKING   Please plan to return to see me in 1 month.   Likely be referring you to orthopedics.  If you need anything prior to your next visit please call the clinic. Please Bring all medications or accurate medication list with you to each appointment; an accurate medication list is essential in providing you the best care possible.

## 2013-03-10 NOTE — Progress Notes (Signed)
[facility] Jeffery Baldwin - 22 y.o. male MRN [id number]  Date of birth: May 04, 1991  CC, HPI, Interval History & ROS  Jeffery Baldwin presents today for Medication Refill and Shoulder Pain  Jeffery Baldwin is here today to followup on the chronic medical conditions including chronic right upper extremity arm pain with new onset of right humeral fracture and tobacco abuse    He reports his pain is continued to be severe.  He is not having any side effects to medications.  He is not at the x-ray is requested.  He has started smoking again.  Denies any fevers, chills or weight loss.  Tolerating the Cymbalta well and feels it is helping his anxiety.  Pt denies chest pain, dyspnea at rest or exertion, PND, lower extremity edema.   Pertinent History & Care Coordination  He is continuing to smoke. Grandmothers reportedly not doing well.  Otherwise please see associated EMR sections for complete problem List, past medical history, past surgical history, family history and social history. Objective Findings  VITALS: HR: 91 bpm  BP: 149/83 mmHg  TEMP: 98.2 F (36.8 C) (Oral)  RESP:   HT: 5\' 11"  (180.3 cm)  WT: 202 lb 9.6 oz (91.899 kg)  BMI: Body mass index is 28.27 kg/(m^2).   BP Readings from Last 3 Encounters:  03/10/13 149/83  02/07/13 114/70  01/29/13 119/72   Wt Readings from Last 3 Encounters:  03/10/13 202 lb 9.6 oz (91.899 kg)  02/07/13 197 lb (89.359 kg)  01/29/13 203 lb (92.08 kg)     PHYSICAL EXAM: GENERAL:  Caucasian male. In no discomfort; no respiratory distress  PSYCH: alert and appropriate, good insight   HNEENT:   CARDIO: RRR, S1/S2 heard, no murmur  LUNGS: CTA B, no wheezes, no crackles  ABDOMEN:   EXTREM:   right upper extremity and sling.  There is marked swelling over the right lateral proximal humerus.  Pain with gentle humeral fulcrum test without significant subluxation   GU:   SKIN:  improved bruising on the right upper extremity however some  residual healing ecchymosis on lateral aspect of shoulder and elbow.     Medications & Orders   Previous Medications   DULOXETINE (CYMBALTA) 30 MG CAPSULE    Take 60 mg by mouth daily.   Modified Medications   Modified Medication Previous Medication   OXYCODONE (ROXICODONE) 30 MG IMMEDIATE RELEASE TABLET oxycodone (ROXICODONE) 30 MG immediate release tablet      Take 1 tablet (30 mg total) by mouth every 6 (six) hours as needed for pain.    Take 1 tablet (30 mg total) by mouth every 6 (six) hours as needed for pain.   New Prescriptions   No medications on file   Discontinued Medications   No medications on file  No orders of the defined types were placed in this encounter.    Assessment & Plan   Problems addressed today: General Instructions:   1. Chronic pain syndrome   2. SMOKER   3. Elevated BP   4. Closed fracture of unspecified part of upper end of humerus     Refilled pain meds, back on prior regimen  GO GET THE X-RAY  STOP SMOKING      For further discussion of A/P and for follow up issues see problem based charting

## 2013-03-12 NOTE — Assessment & Plan Note (Signed)
Smoking cessation counseling provided today

## 2013-03-12 NOTE — Assessment & Plan Note (Signed)
Refill chronic pain medicines today. Patient must get an x-ray to continue receiving pain medications.  He has to be compliant with our agreed upon treatment regimen.  Please see fracture plan

## 2013-03-12 NOTE — Assessment & Plan Note (Signed)
Blood pressure once again elevated today. He has not been doing any of his activities and has been smoking again. Recheck next month and if elevated again will need to start blood pressure medication however TLC is the appropriate treatment at this time

## 2013-03-12 NOTE — Assessment & Plan Note (Signed)
The patient has a very high likelihood of having a nonunion of his right proximal humeral fracture and I am concerned that it has progressively become displaced.  I spent extensive time counseling him on multiple occasions regarding referral to orthopedics and have encouraged him to have followup x-rays performed however he continues to defer further x-rays and will not see an orthopedist at this time.  He does have insurance that will become active next month however I counseled him that he needs to be seen prior to this by an orthopedist especially if repeat x-rays show a further displaced fracture. > Referred to orthopedics when able

## 2013-03-18 ENCOUNTER — Ambulatory Visit (HOSPITAL_COMMUNITY)
Admission: RE | Admit: 2013-03-18 | Discharge: 2013-03-18 | Disposition: A | Discharge status: Home / Self Care | Payer: Self-pay | Source: Ambulatory Visit | Attending: Family Medicine | Admitting: Family Medicine

## 2013-03-18 DIAGNOSIS — X58XXXA Exposure to other specified factors, initial encounter: Secondary | ICD-10-CM | POA: Insufficient documentation

## 2013-03-18 DIAGNOSIS — S42209A Unspecified fracture of upper end of unspecified humerus, initial encounter for closed fracture: Secondary | ICD-10-CM

## 2013-03-18 DIAGNOSIS — S42213A Unspecified displaced fracture of surgical neck of unspecified humerus, initial encounter for closed fracture: Secondary | ICD-10-CM | POA: Insufficient documentation

## 2013-03-20 ENCOUNTER — Telehealth: Payer: Self-pay | Admitting: Sports Medicine
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
## 2013-03-20 NOTE — Telephone Encounter (Signed)
Per Dr Mauricio Po patient was told to get cymbalta refill since it shows it had 11 refills and that last refill was in April per patient and that I will send message to Dr Berline Chough for the oxycodone  per Dr Mauricio Po.patient voiced understanding.please advise. Sakura Denis, Virgel Bouquet
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
## 2013-03-21 ENCOUNTER — Ambulatory Visit (INDEPENDENT_AMBULATORY_CARE_PROVIDER_SITE_OTHER): Payer: Self-pay | Admitting: Family Medicine

## 2013-03-21 ENCOUNTER — Encounter: Payer: Self-pay | Admitting: Family Medicine

## 2013-03-21 VITALS — BP 149/92 | HR 114 | Ht 71.0 in | Wt 199.7 lb

## 2013-03-21 DIAGNOSIS — S42209A Unspecified fracture of upper end of unspecified humerus, initial encounter for closed fracture: Secondary | ICD-10-CM

## 2013-03-21 MED ORDER — DULOXETINE HCL 30 MG PO CPEP: 60.0000 mg | ORAL_CAPSULE | Freq: Every day | ORAL | Status: DC

## 2013-03-21 MED ORDER — OXYCODONE HCL 30 MG PO TABS: 30.0000 mg | ORAL_TABLET | Freq: Four times a day (QID) | ORAL | Status: DC | PRN
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
## 2013-03-23 NOTE — Telephone Encounter (Signed)
Dr. Clinton Sawyer has seen pt. Jeffery Baldwin address at follow up X-rays indicate worsening displacement.  Likely needs ORTHO but pt has declined due to lack of insurance in past

## 2013-03-24 ENCOUNTER — Other Ambulatory Visit: Payer: Self-pay | Admitting: Family Medicine

## 2013-03-24 ENCOUNTER — Encounter: Payer: Self-pay | Admitting: Sports Medicine

## 2013-03-24 ENCOUNTER — Ambulatory Visit (INDEPENDENT_AMBULATORY_CARE_PROVIDER_SITE_OTHER): Payer: Self-pay | Admitting: Sports Medicine

## 2013-03-24 VITALS — BP 140/92 | HR 112 | Ht 71.0 in | Wt 195.5 lb

## 2013-03-24 DIAGNOSIS — G894 Chronic pain syndrome: Secondary | ICD-10-CM

## 2013-03-24 DIAGNOSIS — M5412 Radiculopathy, cervical region: Secondary | ICD-10-CM

## 2013-03-24 DIAGNOSIS — S42209A Unspecified fracture of upper end of unspecified humerus, initial encounter for closed fracture: Secondary | ICD-10-CM

## 2013-03-24 MED ORDER — OXYCODONE HCL 30 MG PO TABS: 30.0000 mg | ORAL_TABLET | Freq: Four times a day (QID) | ORAL | Status: DC | PRN
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
## 2013-03-24 NOTE — Assessment & Plan Note (Addendum)
Refer to Ortho - Dr. Ave Filter at [facility]  Patient has been encouraged to followup with orthopedics previously but has declined noting financial constraints.  He believes he will be up make this happen today.  He is hopeful that he will have Medicaid at the beginning of next month but given the duration of the fracture significant instability do to his flaccidity I am concerned this will not heal although both Brayton Caves and I recognize he will never have normal function of his arm in either case.
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
## 2013-03-24 NOTE — Assessment & Plan Note (Signed)
Patient has a C7,C8 complete nerve root avulsion secondary to traumatic football accident at age 22.  Right upper extremity is completely flaccid and C5-C6 distribution

## 2013-03-25 LAB — DRUG SCR UR, PAIN MGMT, REFLEX CONF
Barbiturate Quant, Ur: NEGATIVE
Benzodiazepines.: NEGATIVE
Marijuana Metabolite: NEGATIVE
Methadone: NEGATIVE
Propoxyphene: NEGATIVE

## 2013-03-26 LAB — OPIATES/OPIOIDS (LC/MS-MS)
Heroin (6-AM), UR: NEGATIVE ng/mL
Hydrocodone: NEGATIVE ng/mL
Morphine Urine: NEGATIVE ng/mL
Noroxycodone, Ur: 1152 ng/mL
Oxycodone, ur: 77 ng/mL
Oxymorphone: 1716 ng/mL

## 2013-04-28 ENCOUNTER — Encounter: Payer: Self-pay | Admitting: Sports Medicine

## 2013-04-28 ENCOUNTER — Ambulatory Visit (INDEPENDENT_AMBULATORY_CARE_PROVIDER_SITE_OTHER): Payer: Self-pay | Admitting: Sports Medicine

## 2013-04-28 VITALS — BP 138/82 | HR 98 | Temp 98.2°F | Ht 71.0 in | Wt 203.0 lb

## 2013-04-28 DIAGNOSIS — G894 Chronic pain syndrome: Secondary | ICD-10-CM

## 2013-04-28 DIAGNOSIS — M5412 Radiculopathy, cervical region: Secondary | ICD-10-CM

## 2013-04-28 DIAGNOSIS — F17200 Nicotine dependence, unspecified, uncomplicated: Secondary | ICD-10-CM

## 2013-04-28 DIAGNOSIS — Z23 Encounter for immunization: Secondary | ICD-10-CM

## 2013-04-28 DIAGNOSIS — S42209A Unspecified fracture of upper end of unspecified humerus, initial encounter for closed fracture: Secondary | ICD-10-CM

## 2013-04-28 MED ORDER — OXYCODONE HCL 30 MG PO TABS: 30.0000 mg | ORAL_TABLET | Freq: Four times a day (QID) | ORAL | Status: DC | PRN

## 2013-04-28 NOTE — Assessment & Plan Note (Signed)
Encouraged to cut back on nicotine dosage

## 2013-04-28 NOTE — Progress Notes (Signed)
  Jeffery Baldwin - 22 y.o. male MRN [id number]  Date of birth: [birth year]  CC, HPI, Interval History & ROS  Jeffery Baldwin is here today to followup on his chronic medical conditions including:  C6-C7 nerve root disorder, elevated blood pressure, chronic pain syndrome, subacute closed fracture of the right humerus.  He reports having appointment for shoulder on coming up in next 1-2 weeks.  Still having "crunching" in left arm.  No new numbness tingling.  Reports his chronic pain is slightly worse with the weather change.  No side effects.   Only 1 cigarette this week.  Using Hooka Pen, with 12mg  nicotine.    Pertinent History & Care Coordination  No Patient Care Coordination Note on file.  History  Smoking status  . Current Every Day Smoker -- 0.20 packs/day for 7 years  . Types: Cigarettes  Smokeless tobacco  . Never Used    Comment: decreased smoking   Health Maintenance Due  Topic  . Influenza Vaccine    No results found for this basename: HGBA1C, TRIG, CHOL, HDL, LDLCALC, TSH,  in the last 8760 hours   Otherwise past Medical, Surgical, Social, and Family History Reviewed per EMR Medications and Allergies reviewed and all updated if necessary. Objective Findings  VITALS: HR: 98 bpm  BP: 138/82 mmHg  TEMP: 98.2 F (36.8 C) (Oral)  RESP:    HT: 5\' 11"  (180.3 cm)  WT: 203 lb (92.08 kg)  BMI: 28.4   BP Readings from Last 3 Encounters:  04/28/13 138/82  03/24/13 140/92  03/21/13 149/92   Wt Readings from Last 3 Encounters:  04/28/13 203 lb (92.08 kg)  03/24/13 195 lb 8 oz (88.678 kg)  03/21/13 199 lb 11.2 oz (90.583 kg)     PHYSICAL EXAM: GENERAL:  young adult Caucasian male. In no discomfort; no respiratory distress  PSYCH: alert and appropriate, good insight   HNEENT:   CARDIO:   LUNGS:   ABDOMEN:   EXTREM:   right upper extremity exam: Flaccid arm in sling, significant denervation atrophy.  Right right shoulder has slight prominence over the lateral aspect of the  humeral head, there is slight crepitation with shoulder protraction and thoracic extension at the region of the glenohumeral joint.  He has significant tenderness to palpation over the supra-and infraspinatus.  Otherwise exam of the upper extremity is limited due to prior generation atrophy.  Has full range of motion of the elbow, no significant bruising and deformity.  No tenderness palpation over the clavicle.    GU:   SKIN:    Assessment & Plan   Problems addressed today: General Plan & Pt Instructions:  1. Chronic pain syndrome   2. Closed fracture of unspecified part of upper end of humerus   3. SMOKER   4. Need for prophylactic vaccination and inoculation against influenza   5. Cervical nerve root disorder       Refilled meds today  Flu today  Keep followup appointment     For further discussion of A/P and for follow up issues see problem based charting if applicable.

## 2013-04-28 NOTE — Assessment & Plan Note (Addendum)
Complicating shoulder injury Cymbalta helping chronic pain

## 2013-04-28 NOTE — Assessment & Plan Note (Signed)
He continues to have crepitation with shoulder protraction and thoracic extension.  I am slightly concerned for a associated scapular fracture although there is no evidence on plain film x-rays that were obtained.  He does have an appointment with orthopedics (finally able to arrange through private pay) next week to see Dr. Ave Filter.  This is a very complicated case and I am unsure whether surgery would be of benefit.  We'll defer any further imaging at this point would favor obtaining a CT scan to evaluate both humerus and scapula.  If this needs to be done we can arrange this through [facility] card) to help defer some of the expenses.    Continue sling immobilizer, and keep followup appointment with Dr. Ave Filter

## 2013-04-28 NOTE — Patient Instructions (Signed)
   Refilled meds today  Flu today  Keep followup appointment   If you need anything prior to your next visit please call the clinic. Please Bring all medications or accurate medication list with you to each appointment; an accurate medication list is essential in providing you the best care possible.

## 2013-04-28 NOTE — Assessment & Plan Note (Signed)
Tolerating medications well.  Slight worsening chronic pain with weather change.  No indication to change medications. Refills today

## 2013-05-12 ENCOUNTER — Encounter: Payer: Self-pay | Admitting: Sports Medicine

## 2013-05-12 ENCOUNTER — Ambulatory Visit (INDEPENDENT_AMBULATORY_CARE_PROVIDER_SITE_OTHER): Payer: Self-pay | Admitting: Sports Medicine

## 2013-05-12 VITALS — BP 150/79 | HR 101 | Temp 98.7°F | Ht 71.0 in | Wt 202.1 lb

## 2013-05-12 DIAGNOSIS — S42209A Unspecified fracture of upper end of unspecified humerus, initial encounter for closed fracture: Secondary | ICD-10-CM

## 2013-05-12 DIAGNOSIS — F17200 Nicotine dependence, unspecified, uncomplicated: Secondary | ICD-10-CM

## 2013-05-12 DIAGNOSIS — G894 Chronic pain syndrome: Secondary | ICD-10-CM

## 2013-05-12 DIAGNOSIS — M5412 Radiculopathy, cervical region: Secondary | ICD-10-CM

## 2013-05-12 DIAGNOSIS — Z7189 Other specified counseling: Secondary | ICD-10-CM

## 2013-05-12 MED ORDER — OXYCODONE HCL 30 MG PO TABS: 30.0000 mg | ORAL_TABLET | Freq: Four times a day (QID) | ORAL | Status: DC | PRN

## 2013-05-12 MED ORDER — DULOXETINE HCL 60 MG PO CPEP: 60.0000 mg | ORAL_CAPSULE | Freq: Every day | ORAL | Status: DC

## 2013-05-12 NOTE — Assessment & Plan Note (Signed)
Once again reiterated the importance of orthopedic followup.  I'm concerned we are beyond the window for surgery and suspect he will have continued ongoing pain.

## 2013-05-12 NOTE — Assessment & Plan Note (Addendum)
Refill for oxycodone to last until scheduled refill on 06/27/2013-do to travel for business  New Rx for Cymbalta 60 mg daily

## 2013-05-12 NOTE — Assessment & Plan Note (Signed)
Patient should qualify for disability but is also in come eligible for Medicaid but has not been able to obtain this.  Will have social work contact patient

## 2013-05-12 NOTE — Assessment & Plan Note (Signed)
Patient  has cut back to using electronic cigarettes only. Encouraged to continue to quit

## 2013-05-12 NOTE — Assessment & Plan Note (Signed)
Continues to be complicated by right humeral fracture. Has not been up to see orthopedics.

## 2013-05-12 NOTE — Patient Instructions (Signed)
   Follow up with ORTHOPEDICS when possible  I will have Jeffery Baldwin (Our Child psychotherapist) call you discuss applying for Medicaid   QUIT SMOKING    If you need anything prior to your next visit please call the clinic. Please Bring all medications or accurate medication list with you to each appointment; an accurate medication list is essential in providing you the best care possible.

## 2013-05-12 NOTE — Progress Notes (Signed)
  Jeffery Baldwin - 22 y.o. male MRN [id number]  Date of birth: 07/12/1990  CC, HPI, Interval History & ROS  Jeffery Baldwin is here today to followup on his chronic medical conditions including:  Chronic Pain, right humerus fracture, social anxiety disorder, tobacco abuse  He reports overall he is continuing to have significant crepitation but the pain is improved in his right upper extremity.    No side effects to medications.  Denies significant constipation or bowel pain.  No significant oversedation.  Reports his anxiety is better on the Cymbalta and is tolerating this well.  Sleeping well  Continues to smoke electronic cigarettes typically using 1x 21 mg cartridge per week.  Has not been able to followup with orthopedics do too not obtaining insurance like he was told.  He has tried to followup on this for still does not have Medicaid in spite of lack of income and has not qualified for disability as of now.  Patient is going out of town for a job that he was recently hired on and is requesting refill of his pain medication early  Pertinent History & Care Coordination  No Patient Care Coordination Note on file.  History  Smoking status  . Current Every Day Smoker -- 0.20 packs/day for 7 years  . Types: Cigarettes  Smokeless tobacco  . Never Used    Comment: decreased smoking  No health maintenance topics applied. No results found for this basename: HGBA1C, TRIG, CHOL, HDL, LDLCALC, TSH,  in the last 8760 hours   Otherwise past Medical, Surgical, Social, and Family History Reviewed per EMR Medications and Allergies reviewed and all updated if necessary. Objective Findings  VITALS: HR: 101 bpm  BP: 150/79 mmHg  TEMP: 98.7 F (37.1 C) (Oral)  RESP:    HT: 5\' 11"  (180.3 cm)  WT: 202 lb 1.6 oz (91.672 kg)  BMI: 28.2   BP Readings from Last 3 Encounters:  05/12/13 150/79  04/28/13 138/82  03/24/13 140/92   Wt Readings from Last 3 Encounters:  05/12/13 202 lb 1.6 oz (91.672 kg)    04/28/13 203 lb (92.08 kg)  03/24/13 195 lb 8 oz (88.678 kg)     PHYSICAL EXAM: GENERAL:  adult Caucasian male. In no discomfort; no respiratory distress  PSYCH: alert and appropriate, good insight  Seems agitated more so than normal.  Stressful home situation but has support from his parents.  He has had to stop going to school due to needing to work.    HNEENT:   CARDIO:   LUNGS:   ABDOMEN:   EXTREM:   right upper extremity with less crepitation than previously.  There is a large lateral prominence on his proximal humerus consistent with the insertion of the deltoid.  There is less crepitation.  Negative humeral fulcrum test   GU:   SKIN:      Assessment & Plan   Problems addressed today: General Plan & Pt Instructions:  1. Closed fracture of unspecified part of upper end of humerus   2. Chronic pain syndrome   3. SMOKER   4. Cervical nerve root disorder   5. Counseling and coordination of care       Follow up with ORTHOPEDICS when possible  I will have Theresia Bough ([facility] psychotherapist) call you discuss applying for Medicaid   QUIT SMOKING     For further discussion of A/P and for follow up issues see problem based charting.

## 2013-05-19 ENCOUNTER — Telehealth: Payer: Self-pay | Admitting: Sports Medicine

## 2013-05-19 ENCOUNTER — Telehealth: Payer: Self-pay | Admitting: Clinical

## 2013-05-19 NOTE — Telephone Encounter (Signed)
Opened in error

## 2013-05-19 NOTE — Telephone Encounter (Signed)
Clinical Child psychotherapist (CSW) left a message for pt to provide resources regarding Medicaid and applying for disability.  Theresia Bough, MSW, LCSW [phone]

## 2013-06-24 ENCOUNTER — Ambulatory Visit (INDEPENDENT_AMBULATORY_CARE_PROVIDER_SITE_OTHER): Payer: Self-pay | Admitting: Sports Medicine

## 2013-06-24 VITALS — BP 137/91 | HR 107

## 2013-06-24 DIAGNOSIS — S42209A Unspecified fracture of upper end of unspecified humerus, initial encounter for closed fracture: Secondary | ICD-10-CM

## 2013-06-24 DIAGNOSIS — R030 Elevated blood-pressure reading, without diagnosis of hypertension: Secondary | ICD-10-CM

## 2013-06-24 DIAGNOSIS — IMO0001 Reserved for inherently not codable concepts without codable children: Secondary | ICD-10-CM

## 2013-06-24 DIAGNOSIS — G894 Chronic pain syndrome: Secondary | ICD-10-CM

## 2013-06-24 DIAGNOSIS — M5412 Radiculopathy, cervical region: Secondary | ICD-10-CM

## 2013-06-24 DIAGNOSIS — F4010 Social phobia, unspecified: Secondary | ICD-10-CM

## 2013-06-24 DIAGNOSIS — F17200 Nicotine dependence, unspecified, uncomplicated: Secondary | ICD-10-CM

## 2013-06-24 MED ORDER — OXYCODONE HCL 30 MG PO TABS: 30.0000 mg | ORAL_TABLET | Freq: Four times a day (QID) | ORAL | Status: DC | PRN

## 2013-06-24 MED ORDER — PROPRANOLOL HCL 40 MG PO TABS: 40.0000 mg | ORAL_TABLET | Freq: Two times a day (BID) | ORAL | Status: DC

## 2013-06-24 NOTE — Assessment & Plan Note (Signed)
Given persistently elevated blood pressures and social anxiety disorder that seems to worsen and will trial propranolol for both blood pressure and anxiolytic effects.  Patient understanding the trial we will see how he responds to this.  Additionally he is tachycardic today and this should help with that.  Patient is reliable in taking his medications routinely.

## 2013-06-24 NOTE — Progress Notes (Signed)
  Jeffery Baldwin - 23 y.o. male MRN [id number]  Date of birth: 02-10-1991  CC, HPI, Interval History & ROS  Jeffery Baldwin is here today to followup on his chronic medical conditions including:  Chronic Pain, right humerus fracture, social anxiety disorder, tobacco abuse,   He reports recently returning from a work trip and reports increased stress.  His arm has been hurting him less than previous.  He has an appointment with orthopedics on 07/20/2013 to have his shoulder evaluated.  There is most likely no further intervention that will be required at this time given how long and this has been going on.  Jeffery Baldwin is aware of this  He has quit smoking.  Reports poor sleep while on his trip.  No adverse effects from his pain medication.  Has an upcoming Medicaid/disability hearing on February 25.  Denies any stimulant/energy drink use.  Pertinent History & Care Coordination  No Patient Care Coordination Note on file.  History  Smoking status  . Current Every Day Smoker -- 0.20 packs/day for 7 years  . Types: Cigarettes  Smokeless tobacco  . Never Used    Comment: decreased smoking  No health maintenance topics applied. No results found for this basename: HGBA1C, TRIG, CHOL, HDL, LDLCALC, TSH,  in the last 8760 hours   Otherwise past Medical, Surgical, Social, and Family History Reviewed per EMR Medications and Allergies reviewed and all updated if necessary. Objective Findings  VITALS: HR: 107 bpm  BP: 137/91 mmHg  TEMP:   ( )  RESP:    HT:    WT:    BMI:     BP Readings from Last 3 Encounters:  06/24/13 137/91  05/12/13 150/79  04/28/13 138/82   Wt Readings from Last 3 Encounters:  05/12/13 202 lb 1.6 oz (91.672 kg)  04/28/13 203 lb (92.08 kg)  03/24/13 195 lb 8 oz (88.678 kg)     PHYSICAL EXAM: GENERAL:  adult Caucasian male. In no discomfort; no respiratory distress  PSYCH: alert and appropriate, good insight  Stressed but less agitated than previously   HNEENT:     CARDIO:   LUNGS:   ABDOMEN:   EXTREM:   right upper extremity in sling.    GU:   SKIN:      Assessment & Plan   Problems addressed today: General Plan & Pt Instructions:  1. Chronic pain syndrome   2. SMOKER   3. Cervical nerve root disorder   4. Social anxiety disorder   5. Elevated BP   6. Closed fracture of unspecified part of upper end of humerus       Good Job on quitting smoking  Start Propranolol to help with anxiety and with helping control your BP.     For further discussion of A/P and for follow up issues see problem based charting.

## 2013-06-24 NOTE — Assessment & Plan Note (Signed)
Has not smoked real cigarettes or electronic cigarettes since our last visit.

## 2013-06-24 NOTE — Assessment & Plan Note (Signed)
Provided refill of oxycodone 30 mg every 6 hours #120

## 2013-06-24 NOTE — Assessment & Plan Note (Signed)
No change, for further workup/treatment to Dr. Ave Filterhandler as currently reporting stable/improving symptoms.

## 2013-06-24 NOTE — Assessment & Plan Note (Signed)
Continue Cymbalta, add propranolol as off label use

## 2013-07-23 ENCOUNTER — Ambulatory Visit (INDEPENDENT_AMBULATORY_CARE_PROVIDER_SITE_OTHER): Payer: Self-pay | Admitting: Sports Medicine

## 2013-07-23 ENCOUNTER — Encounter: Payer: Self-pay | Admitting: Sports Medicine

## 2013-07-23 VITALS — BP 137/77 | HR 85 | Temp 98.2°F | Ht 71.0 in | Wt 210.8 lb

## 2013-07-23 DIAGNOSIS — IMO0001 Reserved for inherently not codable concepts without codable children: Secondary | ICD-10-CM

## 2013-07-23 DIAGNOSIS — R030 Elevated blood-pressure reading, without diagnosis of hypertension: Secondary | ICD-10-CM

## 2013-07-23 DIAGNOSIS — G894 Chronic pain syndrome: Secondary | ICD-10-CM

## 2013-07-23 DIAGNOSIS — Z87891 Personal history of nicotine dependence: Secondary | ICD-10-CM

## 2013-07-23 DIAGNOSIS — F4010 Social phobia, unspecified: Secondary | ICD-10-CM

## 2013-07-23 MED ORDER — OXYCODONE HCL 30 MG PO TABS: 30.0000 mg | ORAL_TABLET | Freq: Four times a day (QID) | ORAL | Status: DC | PRN

## 2013-07-23 NOTE — Assessment & Plan Note (Signed)
Chronic, improved on propranolol

## 2013-07-23 NOTE — Assessment & Plan Note (Signed)
Chronic.  Started on propranolol last visit. Improved.  No adverse effects to medications.

## 2013-07-23 NOTE — Assessment & Plan Note (Signed)
Now greater than 3 months of no nicotine products. Will consider nonsmoker.

## 2013-07-23 NOTE — Patient Instructions (Signed)
   Great job on quitting smoking  2 month supply of pain meds  Signed pain contract again today  Delrae Sawyersavid Walsh. You'll like him    If you need anything prior to your next visit please call the clinic. Please Bring all medications or accurate medication list with you to each appointment; an accurate medication list is essential in providing you the best care possible.

## 2013-07-23 NOTE — Progress Notes (Signed)
Jeffery Baldwin - 23 y.o. male MRN [id number]  Date of birth: 16-Mar-1991  Chief Complaint  Patient presents with  . Medication Refill   Problems/Dx addressed during visit General Plan and Pt Instructions  1. Chronic pain syndrome   2. Social anxiety disorder   3. Former smoker   4. Elevated BP       Great job on quitting smoking  2 month supply of pain meds  Signed pain contract again today  Delrae Sawyersavid Walsh. You'll like him      HPI, INTERVAL HISTORY & ROS    He reports overall doing well.  His pain and disability continues to interfere with his ability to fully function but is functioning is improved with current regimen of oxycodone 30 mg IR by mouth every 6 hours.  He denies any adverse effects including nausea, vomiting, lethargy.  Tolerating propranolol well.  Mild fatigue.  No tachypalpitations.  No sexual performance difficulty.  Pt denies chest pain, dyspnea at rest or exertion, PND, lower extremity edema.  Continues to be a nonsmoker.  Has been greater than 3 months since being off of all nicotine products including the cigarettes.    Still desires to return to school to obtain his degree in counseling.  Currently working part-time job as a Energy managerconstruction assistant.  Not able to followup with orthopedics due to finances.  Pertinent History & Care Coordination  No Patient Care Coordination Note on file.  History  Smoking status  . Former Smoker -- 0.20 packs/day for 7 years  . Types: Cigarettes  Smokeless tobacco  . Never Used    Comment: decreased smoking  No health maintenance topics applied. No results found for this basename: HGBA1C, TRIG, CHOL, HDL, LDLCALC, LDLDIRECT, TSH, T3FREE, FREET4, T3TOTAL, T4TOTAL, THYROIDAB,  in the last 8760 hours     Otherwise past Medical, Surgical, Social, and Family History Reviewed per EMR Medications and Allergies reviewed and all updated if necessary. Objective Findings  VITALS: HR: 85 bpm  BP: 137/77 mmHg  TEMP: 98.2  F (36.8 C) (Oral)  RESP:    HT: 5\' 11"  (180.3 cm)  WT: 210 lb 12.8 oz (95.618 kg)  BMI: 29.5   BP Readings from Last 3 Encounters:  07/23/13 137/77  06/24/13 137/91  05/12/13 150/79   Wt Readings from Last 3 Encounters:  07/23/13 210 lb 12.8 oz (95.618 kg)  05/12/13 202 lb 1.6 oz (91.672 kg)  04/28/13 203 lb (92.08 kg)     PHYSICAL EXAM: GENERAL:  adult Caucasian male. In no discomfort; no respiratory distress  PSYCH: alert and appropriate, good insight  Thoughtful discussion regarding his future.  Desires to return to school.  Feels as though he we'll be able to be a "survivor" in spite of the difficulties with his arm.    HNEENT:   CARDIO:   LUNGS:   ABDOMEN:   EXTREM:   right upper extremity in arm sling.  Denervation changes present.    GU:   SKIN:     Medications, Labs & Other Orders   Previous Medications   DULOXETINE (CYMBALTA) 60 MG CAPSULE    Take 1 capsule (60 mg total) by mouth daily.   PROPRANOLOL (INDERAL) 40 MG TABLET    Take 1 tablet (40 mg total) by mouth 2 (two) times daily.   Modified Medications   Modified Medication Previous Medication   OXYCODONE (ROXICODONE) 30 MG IMMEDIATE RELEASE TABLET oxycodone (ROXICODONE) 30 MG immediate release tablet      Take 1 tablet (30  mg total) by mouth every 6 (six) hours as needed for pain.    Take 1 tablet (30 mg total) by mouth every 6 (six) hours as needed for pain.   New Prescriptions   OXYCODONE (ROXICODONE) 30 MG IMMEDIATE RELEASE TABLET    Take 1 tablet (30 mg total) by mouth every 6 (six) hours as needed for pain.   Discontinued Medications   No medications on file  No orders of the defined types were placed in this encounter.   Assessment & Plan  As above & for further discussion see problem based charting if applicable.

## 2013-07-23 NOTE — Assessment & Plan Note (Addendum)
Chronic.  Stable on current medication regimen. 2 month supply of medicines provided today. Resigned pain contract as has been greater than 2 years.

## 2013-09-05 ENCOUNTER — Telehealth: Payer: Self-pay | Admitting: Family Medicine

## 2013-09-05 NOTE — Telephone Encounter (Signed)
Received emergency line call from pt at 2:45 PM. Pt states he has been trying to call the clinic all day (was unaware that clinic was closed today), needing an appointment early next week "to refill his medications." Pt did not request a specific medication to be refilled over the phone but reiterated several times that he needs an appointment for med refill before he goes out of town "to do a job." Informed pt that I cannot schedule him an appointment, today, but explained that I will route a message to Dr. Janeece Riggersigby's team and the front office that he needs an appointment early next week. Advised pt to call clinic himself first thing Monday morning to make sure that the message was received / an appointment was made, but pt stated he will be unavailable Monday; instead advised pt to call first thing Tuesday. Pt voiced understanding.  Will route message to [facility] and [employer] pool. Please schedule pt an appointment as early next week as possible (preferably with Dr. Berline Choughigby) and call pt to confirm / inform him of the time. Will also route to Dr. Berline Choughigby so he is aware.  Bobbye Mortonhristopher M Mirren Gest, MD PGY-2, [facility] 09/05/2013, 2:54 PM

## 2013-09-09 ENCOUNTER — Ambulatory Visit (INDEPENDENT_AMBULATORY_CARE_PROVIDER_SITE_OTHER): Payer: Self-pay | Admitting: Family Medicine

## 2013-09-09 ENCOUNTER — Encounter: Payer: Self-pay | Admitting: Family Medicine

## 2013-09-09 VITALS — BP 137/83 | HR 92 | Temp 98.4°F | Ht 71.0 in | Wt 210.0 lb

## 2013-09-09 DIAGNOSIS — M65839 Other synovitis and tenosynovitis, unspecified forearm: Secondary | ICD-10-CM

## 2013-09-09 DIAGNOSIS — M65242 Calcific tendinitis, left hand: Secondary | ICD-10-CM | POA: Insufficient documentation

## 2013-09-09 DIAGNOSIS — M6520 Calcific tendinitis, unspecified site: Secondary | ICD-10-CM

## 2013-09-09 DIAGNOSIS — M779 Enthesopathy, unspecified: Secondary | ICD-10-CM

## 2013-09-09 DIAGNOSIS — M65849 Other synovitis and tenosynovitis, unspecified hand: Secondary | ICD-10-CM

## 2013-09-09 DIAGNOSIS — M778 Other enthesopathies, not elsewhere classified: Secondary | ICD-10-CM | POA: Insufficient documentation

## 2013-09-09 DIAGNOSIS — G894 Chronic pain syndrome: Secondary | ICD-10-CM

## 2013-09-09 MED ORDER — OXYCODONE HCL 30 MG PO TABS: 30.0000 mg | ORAL_TABLET | Freq: Four times a day (QID) | ORAL | Status: DC | PRN

## 2013-09-09 NOTE — Patient Instructions (Signed)
Months worth of medicine today.  Come back and see Dr. Berline Choughigby next month.

## 2013-09-09 NOTE — Assessment & Plan Note (Signed)
Overuse injury Ibuprofen, ice.  Not much chance of rest with work. RTC 1 month to assess for improvement.

## 2013-09-09 NOTE — Assessment & Plan Note (Signed)
Chronic narcotic refill today. Discussed 1 vs 2 months -- pt desires 1 and to return in May to "see my doctor again before he graduates."  States didn't have a chance to see prior PCP before he graduated and enjoys the closure brought by saying goodbye.

## 2013-09-09 NOTE — Progress Notes (Signed)
Subjective:    Jeffery Baldwin is a 23 y.o. male [facility]:  1.  Chronic pain:  Secondary to injury occurred age 23.  Closed fracture of humerus resulting in nerve avulsion.  Has been on chronic narcotics for years.  ADLs unable to be accomplished without medications.    2.  Left index finger pain:  Present for 2 weeks.  Dull ache in knuckle and along tendon Left hand.  Paints for work.  Sometimes drops due to pain.  No other pain in hand, no injury.     The following portions of the patient's history were reviewed and updated as appropriate: allergies, current medications, past medical history, family and social history, and problem list. Patient is a nonsmoker.    PMH reviewed.  Past Medical History  Diagnosis Date  . Nerve root avulsion    Past Surgical History  Procedure Laterality Date  . Nerve root repair      Medications reviewed. Current Outpatient Prescriptions  Medication Sig Dispense Refill  . DULoxetine (CYMBALTA) 60 MG capsule Take 1 capsule (60 mg total) by mouth daily.  60 capsule  5  . oxycodone (ROXICODONE) 30 MG immediate release tablet Take 1 tablet (30 mg total) by mouth every 6 (six) hours as needed for pain.  120 tablet  0  . oxycodone (ROXICODONE) 30 MG immediate release tablet Take 1 tablet (30 mg total) by mouth every 6 (six) hours as needed for pain.  120 tablet  0  . propranolol (INDERAL) 40 MG tablet Take 1 tablet (40 mg total) by mouth 2 (two) times daily.  60 tablet  2   No current facility-administered medications for this visit.     Objective:   Physical Exam BP 137/83  Pulse 92  Temp(Src) 98.4 F (36.9 C) (Oral)  Ht 5\' 11"  (1.803 m)  Wt 210 lb (95.255 kg)  BMI 29.30 kg/m2 Gen:  Alert, cooperative patient who appears stated age in no acute distress.  Vital signs reviewed.  Right arm in sling. MSK:  TTP along right shoulder, basially anywhere I palpate Left index finger:  TTP along extensor tendon.  Mild edema.   No bruising noted.   No results found for this or any previous visit (from the past 72 hour(s)).

## 2013-10-10 ENCOUNTER — Ambulatory Visit (INDEPENDENT_AMBULATORY_CARE_PROVIDER_SITE_OTHER): Payer: Self-pay | Admitting: Sports Medicine

## 2013-10-10 ENCOUNTER — Encounter: Payer: Self-pay | Admitting: Sports Medicine

## 2013-10-10 VITALS — BP 136/56 | HR 97 | Temp 99.2°F | Ht 71.0 in | Wt 205.0 lb

## 2013-10-10 DIAGNOSIS — G894 Chronic pain syndrome: Secondary | ICD-10-CM

## 2013-10-10 DIAGNOSIS — Z87891 Personal history of nicotine dependence: Secondary | ICD-10-CM

## 2013-10-10 DIAGNOSIS — S42209A Unspecified fracture of upper end of unspecified humerus, initial encounter for closed fracture: Secondary | ICD-10-CM

## 2013-10-10 MED ORDER — DULOXETINE HCL 60 MG PO CPEP: 60.0000 mg | ORAL_CAPSULE | Freq: Every day | ORAL | Status: DC

## 2013-10-10 MED ORDER — OXYCODONE HCL 30 MG PO TABS: 30.0000 mg | ORAL_TABLET | Freq: Four times a day (QID) | ORAL | Status: DC | PRN

## 2013-10-10 NOTE — Assessment & Plan Note (Signed)
Once again patient recommended to followup with orthopedics however I am doubtful there is any surgical intervention is indicated at this time given the duration and stability of his symptoms.

## 2013-10-10 NOTE — Assessment & Plan Note (Signed)
Once again congratulated the patient on remaining smoke-free

## 2013-10-10 NOTE — Progress Notes (Signed)
Jeffery Baldwin - 23 y.o. 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  He is having occasional clicking and popping of his right shoulder from his recent humerus fracture he's been unable to followup with orthopedics for due to financial constraints.  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  Anxiety: Overall he reports his anxiety is significantly improved in that the Cymbalta is helping his pain and anxiety when he takes it however this is not on a consistent daily basis due to his desire to decrease number of medications and concerns for interactions.  Smoking: He has remained nonsmoking and has not been using the electronic vaporizer in the past month.  See problem based charting for additional problem specific subjective (including HPI, Interval History & ROS)   HISTORY: History  Smoking status  . Former Smoker -- 0.20 packs/day for 7 years  . Types: Cigarettes  Smokeless tobacco  . Never Used    Comment: decreased smoking   Otherwise past Medical, Surgical, Social, and Family History Reviewed per EMR Medications and Allergies reviewed and updated per below.  OBJECTIVE: VITALS: BP: 136/56 mmHg  HR: 97 bpm  TEMP: 99.2 F (37.3 C) (Oral)  RESP:    HT: 5\' 11"  (180.3 cm)  WT: 205 lb (92.987 kg)  BMI: 28.7   BP Readings from Last 3 Encounters:  10/10/13 136/56  09/09/13 137/83  07/23/13 137/77    Wt Readings from Last 3 Encounters:  10/10/13 205 lb (92.987 kg)  09/09/13 210 lb (95.255 kg)  07/23/13 210 lb 12.8 oz (95.618 kg)     Physical Exam  Vitals reviewed. Constitutional: He is  well-developed, well-nourished, and in no distress.  HENT:  Head: Normocephalic and atraumatic.  Right Ear: External ear normal.  Left Ear: External ear normal.  Cardiovascular: Normal rate.   Pulmonary/Chest: Effort normal and breath sounds normal. No respiratory distress.  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 He exhibits normal range of motion and no swelling.  Neurological: He is alert.  Moves all 4 extremities spontaneously; no lateralization.  Skin: Skin is warm and dry. He is not diaphoretic.  Psychiatric: Mood, memory, affect and judgment normal.   MEDICATIONS, LABS & OTHER ORDERS: Previous Medications   OXYCODONE (ROXICODONE) 30 MG IMMEDIATE RELEASE TABLET    Take 1 tablet (30 mg total) by mouth every 6 (six) hours as needed for pain.   Modified Medications   Modified Medication Previous Medication   DULOXETINE (CYMBALTA) 60 MG CAPSULE DULoxetine (CYMBALTA) 60 MG capsule      Take 1 capsule (60 mg total) by mouth daily.    Take 1 capsule (60 mg total) by mouth daily.   OXYCODONE (ROXICODONE) 30 MG IMMEDIATE RELEASE TABLET oxycodone (ROXICODONE) 30 MG immediate release tablet      Take 1 tablet (30 mg total) by mouth every 6 (six) hours as needed for pain.    Take 1 tablet (30 mg total) by mouth every 6 (six) hours as needed for pain.   New  Prescriptions   No medications on file   Discontinued Medications   PROPRANOLOL (INDERAL) 40 MG TABLET    Take 1 tablet (40 mg total) by mouth 2 (two) times daily.  No orders of the defined types were placed in this encounter.   ASSESSMENT & PLAN: See problem based charting & AVS for pt instructions.

## 2013-10-10 NOTE — Assessment & Plan Note (Signed)
Problem Based Documentation:    Subjective Report: Denies any adverse effects to pain medications including nausea, vomiting, confusion, sleepiness, fatigue, constipation. Current medication regimen is significantly improving functional status.     Assessment & Plan & Follow up Issues:  Chronic condition, stable on current regimen and appropriate use in the past. 1. 1 month refill provided of Oxycodone.

## 2013-10-22 ENCOUNTER — Telehealth: Payer: Self-pay | Admitting: Family Medicine
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
## 2013-10-24 ENCOUNTER — Encounter: Payer: Self-pay | Admitting: Sports Medicine

## 2013-10-24 ENCOUNTER — Ambulatory Visit (INDEPENDENT_AMBULATORY_CARE_PROVIDER_SITE_OTHER): Payer: Self-pay | Admitting: Sports Medicine

## 2013-10-24 VITALS — BP 122/72 | HR 111 | Temp 98.2°F | Ht 71.0 in | Wt 204.0 lb

## 2013-10-24 DIAGNOSIS — G894 Chronic pain syndrome: Secondary | ICD-10-CM

## 2013-10-24 MED ORDER — OXYCODONE HCL 30 MG PO TABS: 30.0000 mg | ORAL_TABLET | Freq: Four times a day (QID) | ORAL | Status: DC | PRN
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
## 2013-10-24 NOTE — Telephone Encounter (Signed)
Will see today.  
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
## 2013-10-29 MED ORDER — OXYCODONE HCL 30 MG PO TABS: 30.0000 mg | ORAL_TABLET | Freq: Four times a day (QID) | ORAL | Status: DC | PRN
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
## 2013-10-29 NOTE — Addendum Note (Signed)
Addended by: Gaspar Bidding D on: 10/29/2013 09:10 AM   Modules accepted: Orders, Medications

## 2013-11-24 ENCOUNTER — Ambulatory Visit (INDEPENDENT_AMBULATORY_CARE_PROVIDER_SITE_OTHER): Payer: Self-pay | Admitting: Sports Medicine

## 2013-11-24 ENCOUNTER — Encounter: Payer: Self-pay | Admitting: Sports Medicine

## 2013-11-24 VITALS — BP 149/87 | HR 96 | Wt 202.3 lb

## 2013-11-24 DIAGNOSIS — R030 Elevated blood-pressure reading, without diagnosis of hypertension: Secondary | ICD-10-CM

## 2013-11-24 DIAGNOSIS — G894 Chronic pain syndrome: Secondary | ICD-10-CM

## 2013-11-24 DIAGNOSIS — IMO0001 Reserved for inherently not codable concepts without codable children: Secondary | ICD-10-CM

## 2013-11-24 MED ORDER — OXYCODONE HCL 30 MG PO TABS: 30.0000 mg | ORAL_TABLET | Freq: Four times a day (QID) | ORAL | Status: DC | PRN

## 2013-11-24 MED ORDER — CLONIDINE HCL 0.1 MG PO TABS: 0.1000 mg | ORAL_TABLET | Freq: Two times a day (BID) | ORAL | Status: DC

## 2013-11-24 NOTE — Assessment & Plan Note (Signed)
Chronic untreated condition  - has had multiple readings in the past that borderline hypertension.  Given associated anxiety and potential ADHD component likely be a good candidate for trial of clonidine for the above. 1. Clonidine 0.1 mg by mouth twice a day Followup in one month with new PCP

## 2013-11-24 NOTE — Patient Instructions (Signed)
It was great being your doctor. Lets try starting clonidine to help with your anxiety and focus (and BP).

## 2013-11-24 NOTE — Progress Notes (Signed)
  Jeffery Baldwin - 23 y.o. male MRN [id number]  Date of birth: [birth year]  SUBJECTIVE:  Including CC & ROS.  Jeffery Baldwin is here to followup on his chronic pain syndrome.   Chronic pain: See problem oriented charting  High blood pressure: This has been an ongoing recurrent condition for him.  He has not been drinking any stimulant beverages.  He is under more stress he reports.  He also has reported ADHD-like symptoms in the past we have discussed using clonidine if needing to be treated.  He is agreeable to this.   HISTORY: Past Medical, Surgical, Social, and Family History Reviewed & Updated per EMR. Pertinent Historical Findings include: Patient is well known to me.  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  He additionally had a fall last year resulting in a right she will fracture on the side but he was unable to be seen by orthopedics due to insurance issues.  Previous smoker.  Denies current use  PHYSICAL EXAM:  VS: BP:149/87 mmHg  HR:96bpm  TEMP: ( )  RESP:   HT:    WT:202 lb 4.8 oz (91.763 kg)  BMI:  PHYSICAL EXAM: GENERAL:  Adult Caucasian male male. In no discomfort; no respiratory distress  PSYCH:  alert and appropriate, good insight  HNEENT:  mmm, no JVD CARDIAC:  RRR, S1/S2 heard, no murmur LUNGS:  CTA B, no wheezes, no crackles ABDOMEN:   EXTREM:  Warm, well perfused.  Right upper extremity in a sling, chronic denervation changes with atrophy.  flaccid.   no pretibial edema.   ASSESSMENT & PLAN: See problem based charting & AVS for pt instructions.

## 2013-11-28 NOTE — Assessment & Plan Note (Signed)
Problem Based Documentation:    Subjective Report: Denies any adverse effects to pain medications including nausea, vomiting, confusion, sleepiness, fatigue, constipation. Current medication regimen is significantly improving functional status.     Assessment & Plan & Follow up Issues:  Chronic condition 1. Refills provided > Needs new controlled substance contract with new his provider

## 2013-12-15 ENCOUNTER — Ambulatory Visit (INDEPENDENT_AMBULATORY_CARE_PROVIDER_SITE_OTHER): Payer: Self-pay | Admitting: Family Medicine

## 2013-12-15 ENCOUNTER — Encounter: Payer: Self-pay | Admitting: Family Medicine

## 2013-12-15 VITALS — BP 125/64 | HR 77 | Temp 98.2°F | Ht 71.0 in | Wt 196.0 lb

## 2013-12-15 DIAGNOSIS — IMO0001 Reserved for inherently not codable concepts without codable children: Secondary | ICD-10-CM

## 2013-12-15 DIAGNOSIS — G894 Chronic pain syndrome: Secondary | ICD-10-CM

## 2013-12-15 DIAGNOSIS — R030 Elevated blood-pressure reading, without diagnosis of hypertension: Secondary | ICD-10-CM

## 2013-12-15 MED ORDER — OXYCODONE HCL 30 MG PO TABS: 30.0000 mg | ORAL_TABLET | Freq: Four times a day (QID) | ORAL | Status: DC | PRN

## 2013-12-15 MED ORDER — CLONIDINE HCL 0.1 MG PO TABS: 0.1000 mg | ORAL_TABLET | Freq: Two times a day (BID) | ORAL | Status: DC

## 2013-12-15 NOTE — Patient Instructions (Signed)
Jeffery OtoJesse J Baldwin, it was a pleasure seeing you today. Today we talked about your shoulder pain and blood pressure. I have refilled your medication. Please schedule an appointment for 4 weeks for a physical.   If you have any questions or concerns, please do not hesitate to call the office at [phone].  Sincerely,  Jacquelin Hawkingalph Nettey, MD

## 2013-12-15 NOTE — Assessment & Plan Note (Signed)
Blood pressure controlled. Continue current regimen and refill medication today.

## 2013-12-15 NOTE — Progress Notes (Signed)
   Subjective:    Patient ID: Jessy OtoJesse J Exton, male    DOB: December 26, 1990, 23 y.o.   MRN: [id number]  HPI  Patient presents for follow-up of chronic pain and hypertension  Chronic pain: symptoms of pain in right shoulder down to right hand. Pain is controlled with oxycodone. Has some mild constipation but takes senna for symptoms, which helps.   Hypertension: currently adherent to medication regimen. Has had an intermittent headache since starting school, but otherwise, no headaches. No shortness of breath, chest pain or leg swelling.  Social history: smoking history reviewed  Review of Systems Per HPI    Objective:   Physical Exam  Vitals reviewed. Constitutional: He appears well-developed and well-nourished.  Cardiovascular: Normal rate, regular rhythm and normal heart sounds.   Pulmonary/Chest: Effort normal and breath sounds normal. No respiratory distress. He has no wheezes.          Assessment & Plan:

## 2013-12-15 NOTE — Assessment & Plan Note (Signed)
Refilled oxycodone. Will have patient sign controlled substance documentation at next visit.

## 2013-12-29 ENCOUNTER — Ambulatory Visit (INDEPENDENT_AMBULATORY_CARE_PROVIDER_SITE_OTHER): Payer: Self-pay | Admitting: Family Medicine

## 2013-12-29 ENCOUNTER — Encounter: Payer: Self-pay | Admitting: Family Medicine

## 2013-12-29 ENCOUNTER — Ambulatory Visit (HOSPITAL_COMMUNITY): Payer: Self-pay | Attending: Family Medicine

## 2013-12-29 ENCOUNTER — Telehealth: Payer: Self-pay | Admitting: Family Medicine

## 2013-12-29 VITALS — BP 117/49 | HR 105 | Temp 98.4°F | Ht 71.0 in | Wt 200.0 lb

## 2013-12-29 DIAGNOSIS — S8992XA Unspecified injury of left lower leg, initial encounter: Secondary | ICD-10-CM

## 2013-12-29 DIAGNOSIS — S99919A Unspecified injury of unspecified ankle, initial encounter: Secondary | ICD-10-CM

## 2013-12-29 DIAGNOSIS — Z87891 Personal history of nicotine dependence: Secondary | ICD-10-CM

## 2013-12-29 DIAGNOSIS — S99929A Unspecified injury of unspecified foot, initial encounter: Secondary | ICD-10-CM

## 2013-12-29 DIAGNOSIS — S8990XA Unspecified injury of unspecified lower leg, initial encounter: Secondary | ICD-10-CM

## 2013-12-29 DIAGNOSIS — G894 Chronic pain syndrome: Secondary | ICD-10-CM

## 2013-12-29 MED ORDER — OXYCODONE HCL 30 MG PO TABS: 30.0000 mg | ORAL_TABLET | Freq: Four times a day (QID) | ORAL | Status: DC | PRN

## 2013-12-29 MED ORDER — IBUPROFEN 200 MG PO TABS: 600.0000 mg | ORAL_TABLET | Freq: Four times a day (QID) | ORAL | Status: DC | PRN
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
## 2013-12-29 NOTE — Telephone Encounter (Signed)
Attempted to call patient. Memory is full message

## 2013-12-29 NOTE — Telephone Encounter (Signed)
Patient seen today by Dr Jeffery Baldwin.Jeffery Baldwin, Jeffery Baldwin

## 2013-12-29 NOTE — Patient Instructions (Signed)
Jeffery Baldwin, it was a pleasure seeing you today. Today we talked about your left leg pain. I am prescribing ibuprofen which should be better for your pain. Take 600mg  up to 4 times per day (every  6 hours). I will refill your Oxycodone but please do not double up on your oxycodone next time without calling me.  Please see me in 4 weeks.  If you have any questions or concerns, please do not hesitate to call the office at [phone].  Sincerely,  Jacquelin Hawkingalph Areona Homer, MD

## 2013-12-31 DIAGNOSIS — S8992XA Unspecified injury of left lower leg, initial encounter: Secondary | ICD-10-CM | POA: Insufficient documentation

## 2013-12-31 NOTE — Assessment & Plan Note (Signed)
Concern for DVT. Doppler in ED equivocal because of poor study due to pain. Patient agreeable to repeat doppler. If positive, will need 3 months of anticoagulation. Will refill oxycodone and prescribed ibuprofen for leg pain.
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
## 2014-01-19 ENCOUNTER — Encounter: Payer: Self-pay | Admitting: Family Medicine

## 2014-01-19 ENCOUNTER — Ambulatory Visit (INDEPENDENT_AMBULATORY_CARE_PROVIDER_SITE_OTHER): Payer: Self-pay | Admitting: Family Medicine

## 2014-01-19 VITALS — BP 131/90 | HR 105 | Wt 206.0 lb

## 2014-01-19 DIAGNOSIS — H5702 Anisocoria: Secondary | ICD-10-CM | POA: Insufficient documentation

## 2014-01-19 DIAGNOSIS — S8992XD Unspecified injury of left lower leg, subsequent encounter: Secondary | ICD-10-CM

## 2014-01-19 DIAGNOSIS — S8990XA Unspecified injury of unspecified lower leg, initial encounter: Secondary | ICD-10-CM

## 2014-01-19 DIAGNOSIS — IMO0001 Reserved for inherently not codable concepts without codable children: Secondary | ICD-10-CM

## 2014-01-19 DIAGNOSIS — G894 Chronic pain syndrome: Secondary | ICD-10-CM

## 2014-01-19 DIAGNOSIS — R030 Elevated blood-pressure reading, without diagnosis of hypertension: Secondary | ICD-10-CM

## 2014-01-19 DIAGNOSIS — S99919A Unspecified injury of unspecified ankle, initial encounter: Secondary | ICD-10-CM

## 2014-01-19 DIAGNOSIS — Z5189 Encounter for other specified aftercare: Secondary | ICD-10-CM

## 2014-01-19 DIAGNOSIS — S42209A Unspecified fracture of upper end of unspecified humerus, initial encounter for closed fracture: Secondary | ICD-10-CM

## 2014-01-19 DIAGNOSIS — S99929A Unspecified injury of unspecified foot, initial encounter: Secondary | ICD-10-CM

## 2014-01-19 MED ORDER — OXYCODONE HCL 30 MG PO TABS: 30.0000 mg | ORAL_TABLET | Freq: Four times a day (QID) | ORAL | Status: DC | PRN

## 2014-01-19 NOTE — Assessment & Plan Note (Signed)
Blood pressure controlled.  Continue current regimen. ?

## 2014-01-19 NOTE — Patient Instructions (Signed)
Jeffery Baldwin, it was a pleasure seeing you today. Today we talked about your shoulder. I am thinking it will be beneficial for you to see orthopedic surgery even though it has almost been a year. 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 Please return to see me in 4 weeks  If you have any questions or concerns, please do not hesitate to call the office at [phone].  Sincerely,  Jacquelin Hawkingalph Melton Walls, MD

## 2014-01-19 NOTE — Assessment & Plan Note (Signed)
Refill oxycodone

## 2014-01-19 NOTE — Assessment & Plan Note (Addendum)
According to patient, he has had this for at least a year since his accident. He states neurology did not tell him anything about the findings. No other cranial nerve deficits. Both eyes are reactive and accommodate. Do not think anything acute is occuring. No recent trauma. Unknown if this is a normal variant. Will follow and will consider neurology consult if patient is able to obtain insurance.

## 2014-01-19 NOTE — Assessment & Plan Note (Addendum)
Patient taking Xarelto for diagnosed DVT. Patient did not obtain ultrasound after last visit. Unsure if he actually has a DVT but will continue therapy since already started. Overall, leg looks greatly improved from last visit.

## 2014-01-19 NOTE — Progress Notes (Signed)
   Subjective:    Patient ID: Jeffery Baldwin, male    DOB: 1991-05-26, 23 y.o.   MRN: [id number]  HPI  Treatment for DVT: patient taking Xarelto as directed. No side effects other than having a feeling of fatigue at times, although he states he also recently started going back to school. He complaints of eye blurriness but only when he is at school under fluorescent lights. He also has a headache for which he takes ibuprofen. It is made worse by staring at computer screens.   Humeral fracture: chronic. Pain controlled with oxycodone. Pain has been a little worse recently but manageable. He is able to flex and extend his elbow, but is not able to move his shoulder much without pain.  Hypertension: currently taking clonidine. Does not complain of chest pain or shortness of breath.  History  Substance Use Topics  . Smoking status: Former Smoker -- 0.20 packs/day for 7 years    Types: Cigarettes  . Smokeless tobacco: Never Used     Comment: decreased smoking  . Alcohol Use: Yes     Comment: occasional    Review of Systems Per HPI    Objective:   Physical Exam  Constitutional: He is oriented to person, place, and time. He appears well-developed and well-nourished.  Eyes: Conjunctivae and EOM are normal. Right pupil is round and reactive. Left pupil is round and reactive. Pupils are unequal (Right pupil is about 2mm. Left pupil is about 4mm).  Musculoskeletal: He exhibits no edema.  Neurological: He is alert and oriented to person, place, and time. No cranial nerve deficit or sensory deficit.       Assessment & Plan:
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
## 2014-02-16 ENCOUNTER — Ambulatory Visit: Payer: Self-pay | Admitting: Family Medicine

## 2014-02-16 ENCOUNTER — Other Ambulatory Visit: Payer: Self-pay | Admitting: Family Medicine

## 2014-02-16 DIAGNOSIS — G894 Chronic pain syndrome: Secondary | ICD-10-CM

## 2014-02-16 MED ORDER — OXYCODONE HCL 30 MG PO TABS: 30.0000 mg | ORAL_TABLET | Freq: Four times a day (QID) | ORAL | Status: DC | PRN

## 2014-02-16 MED ORDER — CLONIDINE HCL 0.1 MG PO TABS: 0.1000 mg | ORAL_TABLET | Freq: Two times a day (BID) | ORAL | Status: DC

## 2014-02-16 NOTE — Telephone Encounter (Signed)
Patient arrived 45 minutes late for appointment. Not able to see him today. Will provide refill of medication to last until next available appointment. This will be a one time courtesy since there seems to have been some misunderstanding in appointment time.

## 2014-03-19 ENCOUNTER — Encounter: Payer: Self-pay | Admitting: Family Medicine

## 2014-03-19 ENCOUNTER — Ambulatory Visit (INDEPENDENT_AMBULATORY_CARE_PROVIDER_SITE_OTHER): Payer: Self-pay | Admitting: Family Medicine

## 2014-03-19 VITALS — BP 128/78 | HR 88 | Wt 207.0 lb

## 2014-03-19 DIAGNOSIS — G894 Chronic pain syndrome: Secondary | ICD-10-CM

## 2014-03-19 MED ORDER — OXYCODONE HCL 30 MG PO TABS: 30.0000 mg | ORAL_TABLET | Freq: Four times a day (QID) | ORAL | Status: DC | PRN

## 2014-03-19 NOTE — Progress Notes (Signed)
    Subjective    Jeffery Baldwin is a 23 y.o. male that presents for an office visit.   1. Right arm injury: Patient is on chronic narcotics. Has not seen orthopedic surgery because of lack of insurance. Oxycodone helps with his pain. 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  History  Substance Use Topics  . Smoking status: Former Smoker -- 0.20 packs/day for 7 years    Types: Cigarettes  . Smokeless tobacco: Never Used     Comment: decreased smoking  . Alcohol Use: Yes     Comment: occasional    Allergies  Allergen Reactions  . Ms Contin Rivka Safer[Morphine Sulfate] Nausea And Vomiting    No orders of the defined types were placed in this encounter.    ROS  Per HPI   Objective   BP 128/78  Pulse 88  Wt 207 lb (93.895 kg)  General: Well appearing in no acute distress Musculoskeletal: Right arm with muscle atrophy present throughout. Limited range of motion because of pain. Flexion of shoulder to 90 degrees passively. Neuro: No sensation of right forearm and hand. Grip 0/5 on left.  Assessment and Plan   Please refer to problem based charting of assessment and plan

## 2014-03-19 NOTE — Assessment & Plan Note (Signed)
Will refill oxycodone. Discussed with patient plans to transition off of narcotics eventually. In the mean time, will forward case to social worker to see if it is possible to help him with insurance. UDS today.

## 2014-03-19 NOTE — Patient Instructions (Signed)
Thank you for coming to see me today. It was a pleasure. Today we talked about:   Arm pain: I will refill your medication. I will talk to our social worker about insurance in your case. I would still eventually like to get you off of narcotics as I believe we are doing more harm than good.   Please make an appointment to see me in 4 weeks for follow-up.  If you have any questions or concerns, please do not hesitate to call the office at [phone].  Sincerely,  Jacquelin Hawkingalph Mosie Angus, MD

## 2014-03-20 LAB — DRUG SCR UR, PAIN MGMT, REFLEX CONF
Amphetamine Screen, Ur: NEGATIVE
BENZODIAZEPINES.: NEGATIVE
Barbiturate Quant, Ur: NEGATIVE
Cocaine Metabolites: NEGATIVE
Creatinine,U: 284.1 mg/dL
Marijuana Metabolite: NEGATIVE
Methadone: NEGATIVE
PHENCYCLIDINE (PCP): NEGATIVE
PROPOXYPHENE: NEGATIVE

## 2014-03-23 LAB — OPIATES/OPIOIDS (LC/MS-MS)
CODEINE URINE: NEGATIVE ng/mL (ref <50)
HYDROCODONE: NEGATIVE ng/mL (ref <50)
Hydromorphone: NEGATIVE ng/mL (ref <50)
Morphine Urine: 161 ng/mL — ABNORMAL HIGH (ref <50)
Norhydrocodone, Ur: NEGATIVE ng/mL (ref <50)
Noroxycodone, Ur: NEGATIVE ng/mL (ref <50)
Oxycodone, ur: NEGATIVE ng/mL (ref <50)
Oxymorphone: 7053 ng/mL — ABNORMAL HIGH (ref <50)

## 2014-04-10 ENCOUNTER — Encounter: Payer: Self-pay | Admitting: Family Medicine

## 2014-04-10 ENCOUNTER — Ambulatory Visit (INDEPENDENT_AMBULATORY_CARE_PROVIDER_SITE_OTHER): Payer: Self-pay | Admitting: Family Medicine

## 2014-04-10 VITALS — BP 157/90 | HR 134 | Temp 97.6°F | Wt 214.2 lb

## 2014-04-10 DIAGNOSIS — M79601 Pain in right arm: Secondary | ICD-10-CM

## 2014-04-10 DIAGNOSIS — K529 Noninfective gastroenteritis and colitis, unspecified: Secondary | ICD-10-CM

## 2014-04-10 MED ORDER — CALCIUM CARB-CHOLECALCIFEROL 600-500 MG-UNIT PO CAPS: 1.0000 | ORAL_CAPSULE | Freq: Every day | ORAL | Status: DC

## 2014-04-10 MED ORDER — ONDANSETRON 4 MG PO TBDP: 4.0000 mg | ORAL_TABLET | Freq: Three times a day (TID) | ORAL | Status: DC | PRN

## 2014-04-10 NOTE — Assessment & Plan Note (Signed)
Recommended patient sleep with a pillow at his right side to keep him from rolling over to that side. Also will start calcium and vitamin D to help with osteopenia.

## 2014-04-10 NOTE — Progress Notes (Signed)
    Subjective    Jeffery Baldwin is a 23 y.o. male that presents for an office visit.   1. Emesis and diarrhea: Started a week ago. Initially started vomiting with diarrhea. Emesis improved but diarrhea has persisted. No history of recent antibiotics. Has been able to take some small amounts of fluid including ginger ale, chicken noodle soup, and chicken broth. No sick contacts. Has associated chills. Has a history of similar symptoms about 3 weeks ago with an associated fever of 102 at that time, but symptoms resolved after 24 hours. 2. Right arm pain: pain is mainly brought up when patient is lying down at night as he sometimes rolls over to that arm. He has a history of osteopenia in that arm and states he was previously on calcium and vitamin D but stopped treatment.   History  Substance Use Topics  . Smoking status: Former Smoker -- 0.20 packs/day for 7 years    Types: Cigarettes  . Smokeless tobacco: Never Used     Comment: decreased smoking  . Alcohol Use: Yes     Comment: occasional    Allergies  Allergen Reactions  . Ms Contin Rivka Safer[Morphine Sulfate] Nausea And Vomiting    No orders of the defined types were placed in this encounter.    ROS  Per HPI   Objective   BP 157/90 mmHg  Pulse 134  Temp(Src) 97.6 F (36.4 C) (Oral)  Wt 214 lb 3.2 oz (97.16 kg)  General: Well appearing male Gastrointestinal: abdomen soft, non-tender, non-distended    Musculoskeletal: right arm is non-tender. Able to extend and full flex the elbow. Muscle atrophy noted in right arm. No tenderness along medial or lateral epicondyle  Assessment and Plan   Please refer to problem based charting of assessment and plan

## 2014-04-10 NOTE — Assessment & Plan Note (Signed)
Most likely a viral etiology. Zofran for nausea and recommended good hydration.

## 2014-04-10 NOTE — Patient Instructions (Signed)
Thank you for coming to see me today. It was a pleasure. Today we talked about:   Gastroenteritis: I will prescribe some nausea medication to help with your nausea. Please keep yourself well hydrated by drinking plenty of fluids.  Arm pain: Since this is related to you sleeping at night, we discussed propping a pillow up on your right side to stop yourself from rolling over to that side. I will also prescribe you vitamin D and calcium  Please make an appointment to see me for our next appointment  If you have any questions or concerns, please do not hesitate to call the office at [phone].  Sincerely,  Jacquelin Hawkingalph Nettey, MD

## 2014-04-15 ENCOUNTER — Telehealth: Payer: Self-pay | Admitting: Family Medicine

## 2014-04-15 NOTE — Telephone Encounter (Signed)
Spoke with patient and informed him of below. He was informed of the post date and he did say it back to me

## 2014-04-15 NOTE — Telephone Encounter (Signed)
Pt called because Dr. Caleb PoppNettey told him to call and remind him about his medications. jw

## 2014-04-15 NOTE — Telephone Encounter (Signed)
Will write prescription on Friday afternoon for Friday pick up with post date of 11/15.

## 2014-04-17 ENCOUNTER — Telehealth: Payer: Self-pay | Admitting: Family Medicine

## 2014-04-17 DIAGNOSIS — G894 Chronic pain syndrome: Secondary | ICD-10-CM

## 2014-04-17 MED ORDER — OXYCODONE HCL 30 MG PO TABS: 30.0000 mg | ORAL_TABLET | Freq: Four times a day (QID) | ORAL | Status: DC | PRN

## 2014-04-17 NOTE — Telephone Encounter (Signed)
Medication refilled

## 2014-04-17 NOTE — Telephone Encounter (Signed)
Spoke with patient and informed him of below 

## 2014-04-26 ENCOUNTER — Inpatient Hospital Stay (HOSPITAL_COMMUNITY)
Admission: EM | Admit: 2014-04-26 | Discharge: 2014-04-30 | DRG: 603 | Disposition: A | Discharge status: Home / Self Care | Payer: Self-pay | Attending: Family Medicine | Admitting: Family Medicine

## 2014-04-26 ENCOUNTER — Encounter (HOSPITAL_COMMUNITY): Payer: Self-pay | Admitting: Emergency Medicine

## 2014-04-26 DIAGNOSIS — G894 Chronic pain syndrome: Secondary | ICD-10-CM | POA: Diagnosis present

## 2014-04-26 DIAGNOSIS — L98491 Non-pressure chronic ulcer of skin of other sites limited to breakdown of skin: Secondary | ICD-10-CM | POA: Diagnosis present

## 2014-04-26 DIAGNOSIS — L03113 Cellulitis of right upper limb: Principal | ICD-10-CM | POA: Diagnosis present

## 2014-04-26 DIAGNOSIS — G5681 Other specified mononeuropathies of right upper limb: Secondary | ICD-10-CM | POA: Diagnosis present

## 2014-04-26 DIAGNOSIS — M5412 Radiculopathy, cervical region: Secondary | ICD-10-CM | POA: Diagnosis present

## 2014-04-26 DIAGNOSIS — F419 Anxiety disorder, unspecified: Secondary | ICD-10-CM | POA: Diagnosis present

## 2014-04-26 DIAGNOSIS — L0390 Cellulitis, unspecified: Secondary | ICD-10-CM

## 2014-04-26 DIAGNOSIS — E6609 Other obesity due to excess calories: Secondary | ICD-10-CM | POA: Diagnosis present

## 2014-04-26 DIAGNOSIS — F4010 Social phobia, unspecified: Secondary | ICD-10-CM | POA: Diagnosis present

## 2014-04-26 DIAGNOSIS — Z1389 Encounter for screening for other disorder: Secondary | ICD-10-CM

## 2014-04-26 DIAGNOSIS — L0291 Cutaneous abscess, unspecified: Secondary | ICD-10-CM

## 2014-04-26 DIAGNOSIS — R030 Elevated blood-pressure reading, without diagnosis of hypertension: Secondary | ICD-10-CM

## 2014-04-26 DIAGNOSIS — I10 Essential (primary) hypertension: Secondary | ICD-10-CM | POA: Diagnosis present

## 2014-04-26 DIAGNOSIS — F909 Attention-deficit hyperactivity disorder, unspecified type: Secondary | ICD-10-CM | POA: Diagnosis present

## 2014-04-26 DIAGNOSIS — L03119 Cellulitis of unspecified part of limb: Secondary | ICD-10-CM

## 2014-04-26 DIAGNOSIS — Z87891 Personal history of nicotine dependence: Secondary | ICD-10-CM

## 2014-04-26 DIAGNOSIS — L02519 Cutaneous abscess of unspecified hand: Secondary | ICD-10-CM | POA: Insufficient documentation

## 2014-04-26 DIAGNOSIS — IMO0001 Reserved for inherently not codable concepts without codable children: Secondary | ICD-10-CM | POA: Diagnosis present

## 2014-04-26 MED ORDER — VANCOMYCIN HCL IN DEXTROSE 1-5 GM/200ML-% IV SOLN
1000.0000 mg | Freq: Once | INTRAVENOUS | Status: AC
  Administered 2014-04-27: 1000 mg via INTRAVENOUS
  Filled 2014-04-26: qty 200

## 2014-04-26 MED ORDER — SODIUM CHLORIDE 0.9 % IV BOLUS (SEPSIS)
1000.0000 mL | Freq: Once | INTRAVENOUS | Status: AC
Start: 2014-04-27 — End: 2014-04-27
  Administered 2014-04-27: 1000 mL via INTRAVENOUS

## 2014-04-26 NOTE — ED Notes (Signed)
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 Pt states wound to palm was intact this am, now open with black center. Foul odor per pt.

## 2014-04-26 NOTE — ED Provider Notes (Signed)
CSN: [id number]     Arrival date & time 04/26/14  2322 History   First MD Initiated Contact with Patient 04/26/14 2339     Chief Complaint  Patient presents with  . 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 States it drained green pus. Has been foul-smelling. Denies subjective fevers or chills. No nausea or vomiting. Otherwise patient states he feels in his normal state of health. Past Medical History  Diagnosis Date  . Nerve root avulsion    Past Surgical History  Procedure Laterality Date  . Nerve root repair     No family history on file. History  Substance Use Topics  . Smoking status: Former Smoker -- 0.20 packs/day for 7 years    Types: Cigarettes  . Smokeless tobacco: Never Used     Comment: decreased smoking  . Alcohol Use: Yes     Comment: occasional    Review of Systems  Constitutional: Negative for fever and chills.  Gastrointestinal: Negative for nausea and vomiting.  Skin: Positive for color change and wound.  All other systems reviewed and are negative.     Allergies  Ms contin  Home Medications   Prior to Admission medications   Medication Sig Start Date End Date Taking? Authorizing Provider  Calcium Carb-Cholecalciferol (CALCIUM PLUS VITAMIN D3) 600-500 MG-UNIT CAPS Take 1 capsule by mouth daily. 04/10/14   Jacquelin Hawkingalph Nettey, MD  cloNIDine (CATAPRES) 0.1 MG tablet Take 1 tablet (0.1 mg total) by mouth 2 (two) times daily. 02/16/14   Jacquelin Hawkingalph Nettey, MD  DULoxetine (CYMBALTA) 60 MG capsule Take 1 capsule (60 mg total) by mouth daily. 10/10/13   Andrena MewsMichael D Rigby, DO  ibuprofen (ADVIL) 200 MG tablet Take 3 tablets (600 mg total) by mouth every 6 (six) hours  as needed for mild pain or moderate pain. 12/29/13   Jacquelin Hawkingalph Nettey, MD  ondansetron (ZOFRAN ODT) 4 MG disintegrating tablet Take 1 tablet (4 mg total) by mouth every 8 (eight) hours as needed for nausea or vomiting. 04/10/14   Jacquelin Hawkingalph Nettey, MD  oxycodone (ROXICODONE) 30 MG immediate release tablet Take 1 tablet (30 mg total) by mouth every 6 (six) hours as needed for pain. 04/19/14 05/19/14  Jacquelin Hawkingalph Nettey, MD   BP 142/75 mmHg  Pulse 114  Temp(Src) 98.7 F (37.1 C) (Oral)  Resp 20  Ht 5\' 11"  (1.803 m)  Wt 200 lb (90.719 kg)  BMI 27.91 kg/m2  SpO2 98% Physical Exam  Constitutional: He is oriented to person, place, and time. He appears well-developed and well-nourished. No distress.  HENT:  Head: Normocephalic and atraumatic.  Mouth/Throat: Oropharynx is clear and moist.  Eyes: EOM are normal. Pupils are equal, round, and reactive to light.  Neck: Normal range of motion. Neck supple.  Cardiovascular: Normal rate and regular rhythm.   Pulmonary/Chest: Effort normal and breath sounds normal. No respiratory distress. He has no wheezes. He has no rales.  Abdominal: Soft. Bowel sounds are normal.  Musculoskeletal: Normal range of motion. He exhibits edema. He exhibits no tenderness.  Neurological: He is alert and oriented to person, place, and time.  Patient with no sensation in the right upper extremity. 0/5 motor in the right upper extremity. This is unchanged from his baseline.  Skin: Skin  is warm and dry. No rash noted. There is erythema.  Patient with open abscess of the palmar surface of the distal end of the second metacarpal. Not actively draining purulent material. I do not appreciate any palpable masses. Patient has diffuse edema and erythema of the palmar surface of the hand extending into the fingers and then up the forearm. No axillary lymphadenopathy.  Psychiatric: He has a normal mood and affect. His behavior is normal.  Nursing note and vitals reviewed.   ED Course  Procedures  (including critical care time) Labs Review Labs Reviewed  CBC WITH DIFFERENTIAL  COMPREHENSIVE METABOLIC PANEL    Imaging Review No results found.   EKG Interpretation None      MDM   Final diagnoses:  Cellulitis of right hand    Discussed with Dr. Ermalinda MemosBradshaw. Will accept in transfer on behalf of Dr. Lum BabeEniola  Discussed with hand surgeon, Dr Janee Mornhompson. Agrees with plan. Re-consult as needed if not improving on antibiotics.       Loren Raceravid Hamzah Savoca, MD 04/27/14 [id number]

## 2014-04-27 ENCOUNTER — Inpatient Hospital Stay (HOSPITAL_COMMUNITY): Payer: Self-pay

## 2014-04-27 ENCOUNTER — Inpatient Hospital Stay (HOSPITAL_COMMUNITY): Payer: MEDICAID

## 2014-04-27 DIAGNOSIS — L03113 Cellulitis of right upper limb: Principal | ICD-10-CM | POA: Diagnosis present

## 2014-04-27 DIAGNOSIS — M5412 Radiculopathy, cervical region: Secondary | ICD-10-CM

## 2014-04-27 DIAGNOSIS — R030 Elevated blood-pressure reading, without diagnosis of hypertension: Secondary | ICD-10-CM

## 2014-04-27 DIAGNOSIS — F4010 Social phobia, unspecified: Secondary | ICD-10-CM

## 2014-04-27 LAB — BASIC METABOLIC PANEL
ANION GAP: 16 — AB (ref 5–15)
BUN: 8 mg/dL (ref 6–23)
CHLORIDE: 105 meq/L (ref 96–112)
CO2: 18 mEq/L — ABNORMAL LOW (ref 19–32)
Calcium: 8.8 mg/dL (ref 8.4–10.5)
Creatinine, Ser: 0.72 mg/dL (ref 0.50–1.35)
GFR calc Af Amer: >90 mL/min (ref >90)
GFR calc non Af Amer: >90 mL/min (ref >90)
Glucose, Bld: 109 mg/dL — ABNORMAL HIGH (ref 70–99)
Potassium: 3.9 mEq/L (ref 3.7–5.3)
SODIUM: 139 meq/L (ref 137–147)

## 2014-04-27 LAB — COMPREHENSIVE METABOLIC PANEL
ALT: 27 U/L (ref 0–53)
AST: 32 U/L (ref 0–37)
Albumin: 4.1 g/dL (ref 3.5–5.2)
Alkaline Phosphatase: 132 U/L — ABNORMAL HIGH (ref 39–117)
Anion gap: 17 — ABNORMAL HIGH (ref 5–15)
BUN: 10 mg/dL (ref 6–23)
CO2: 22 mEq/L (ref 19–32)
Calcium: 9.8 mg/dL (ref 8.4–10.5)
Chloride: 98 mEq/L (ref 96–112)
Creatinine, Ser: 0.76 mg/dL (ref 0.50–1.35)
GFR calc non Af Amer: >90 mL/min (ref >90)
GLUCOSE: 121 mg/dL — AB (ref 70–99)
Potassium: 3.9 mEq/L (ref 3.7–5.3)
Sodium: 137 mEq/L (ref 137–147)
Total Bilirubin: 0.8 mg/dL (ref 0.3–1.2)
Total Protein: 8.4 g/dL — ABNORMAL HIGH (ref 6.0–8.3)

## 2014-04-27 LAB — CBC WITH DIFFERENTIAL/PLATELET
Basophils Absolute: 0 10*3/uL (ref 0.0–0.1)
Basophils Relative: 0 % (ref 0–1)
EOS PCT: 2 % (ref 0–5)
Eosinophils Absolute: 0.1 10*3/uL (ref 0.0–0.7)
HCT: 40.4 % (ref 39.0–52.0)
Hemoglobin: 14.3 g/dL (ref 13.0–17.0)
LYMPHS ABS: 1.7 10*3/uL (ref 0.7–4.0)
Lymphocytes Relative: 25 % (ref 12–46)
MCH: 31 pg (ref 26.0–34.0)
MCHC: 35.4 g/dL (ref 30.0–36.0)
MCV: 87.4 fL (ref 78.0–100.0)
Monocytes Absolute: 0.5 10*3/uL (ref 0.1–1.0)
Monocytes Relative: 8 % (ref 3–12)
Neutro Abs: 4.3 10*3/uL (ref 1.7–7.7)
Neutrophils Relative %: 65 % (ref 43–77)
PLATELETS: 173 10*3/uL (ref 150–400)
RBC: 4.62 MIL/uL (ref 4.22–5.81)
RDW: 12.2 % (ref 11.5–15.5)
WBC: 6.7 10*3/uL (ref 4.0–10.5)

## 2014-04-27 LAB — CBC
HCT: 34.9 % — ABNORMAL LOW (ref 39.0–52.0)
HEMOGLOBIN: 12.2 g/dL — AB (ref 13.0–17.0)
MCH: 30.5 pg (ref 26.0–34.0)
MCHC: 35 g/dL (ref 30.0–36.0)
MCV: 87.3 fL (ref 78.0–100.0)
Platelets: 152 10*3/uL (ref 150–400)
RBC: 4 MIL/uL — ABNORMAL LOW (ref 4.22–5.81)
RDW: 12.2 % (ref 11.5–15.5)
WBC: 4.9 10*3/uL (ref 4.0–10.5)

## 2014-04-27 MED ORDER — VANCOMYCIN HCL IN DEXTROSE 1-5 GM/200ML-% IV SOLN
1000.0000 mg | Freq: Three times a day (TID) | INTRAVENOUS | Status: DC
  Administered 2014-04-27 – 2014-04-28 (×3): 1000 mg via INTRAVENOUS
  Filled 2014-04-27 (×6): qty 200

## 2014-04-27 MED ORDER — OXYCODONE HCL 5 MG PO TABS
30.0000 mg | ORAL_TABLET | Freq: Four times a day (QID) | ORAL | Status: DC | PRN
  Administered 2014-04-27 – 2014-04-28 (×4): 30 mg via ORAL
  Filled 2014-04-27 (×5): qty 6

## 2014-04-27 MED ORDER — DIPHENHYDRAMINE HCL 25 MG PO CAPS
25.0000 mg | ORAL_CAPSULE | Freq: Every evening | ORAL | Status: DC | PRN
  Administered 2014-04-27: 25 mg via ORAL
  Filled 2014-04-27: qty 1

## 2014-04-27 MED ORDER — IBUPROFEN 600 MG PO TABS
600.0000 mg | ORAL_TABLET | Freq: Four times a day (QID) | ORAL | Status: DC | PRN
  Filled 2014-04-27: qty 1

## 2014-04-27 MED ORDER — LORAZEPAM 2 MG/ML IJ SOLN
2.0000 mg | Freq: Once | INTRAMUSCULAR | Status: AC
  Administered 2014-04-27: 2 mg via INTRAVENOUS
  Filled 2014-04-27: qty 1

## 2014-04-27 MED ORDER — DULOXETINE HCL 60 MG PO CPEP
60.0000 mg | ORAL_CAPSULE | Freq: Every day | ORAL | Status: DC
  Administered 2014-04-27 – 2014-04-30 (×4): 60 mg via ORAL
  Filled 2014-04-27 (×4): qty 1

## 2014-04-27 MED ORDER — ONDANSETRON 4 MG PO TBDP
4.0000 mg | ORAL_TABLET | Freq: Three times a day (TID) | ORAL | Status: DC | PRN
  Filled 2014-04-27: qty 1

## 2014-04-27 MED ORDER — HEPARIN SODIUM (PORCINE) 5000 UNIT/ML IJ SOLN
5000.0000 [IU] | Freq: Three times a day (TID) | INTRAMUSCULAR | Status: DC
  Administered 2014-04-27: 5000 [IU] via SUBCUTANEOUS
  Filled 2014-04-27 (×4): qty 1

## 2014-04-27 MED ORDER — CLONIDINE HCL 0.1 MG PO TABS
0.1000 mg | ORAL_TABLET | Freq: Two times a day (BID) | ORAL | Status: DC
  Administered 2014-04-27 (×3): 0.1 mg via ORAL
  Filled 2014-04-27 (×5): qty 1

## 2014-04-27 MED ORDER — LORAZEPAM 1 MG PO TABS
1.0000 mg | ORAL_TABLET | Freq: Once | ORAL | Status: AC
  Administered 2014-04-27: 1 mg via ORAL
  Filled 2014-04-27: qty 1

## 2014-04-27 MED ORDER — GADOBENATE DIMEGLUMINE 529 MG/ML IV SOLN
20.0000 mL | Freq: Once | INTRAVENOUS | Status: AC | PRN
  Administered 2014-04-27: 19 mL via INTRAVENOUS

## 2014-04-27 MED ORDER — PIPERACILLIN-TAZOBACTAM 3.375 G IVPB
3.3750 g | Freq: Three times a day (TID) | INTRAVENOUS | Status: DC
  Administered 2014-04-27 – 2014-04-28 (×3): 3.375 g via INTRAVENOUS
  Filled 2014-04-27 (×5): qty 50

## 2014-04-27 NOTE — Progress Notes (Signed)
ANTIBIOTIC CONSULT NOTE - INITIAL  Pharmacy Consult for vancomycin Indication: cellulitis  Allergies  Allergen Reactions  . Ms Contin Rivka Safer[Morphine Sulfate] Nausea And Vomiting    Patient Measurements: Height: 5\' 11"  (180.3 cm) Weight: 199 lb 4.8 oz (90.402 kg) IBW/kg (Calculated) : 75.3   Vital Signs: Temp: 98.4 F (36.9 C) (11/23 0100) Temp Source: Oral (11/23 0100) BP: 123/82 mmHg (11/23 0100) Pulse Rate: 94 (11/23 0100)  Labs:  Recent Labs  04/27/14 0017  WBC 6.7  HGB 14.3  PLT 173  CREATININE 0.76   Estimated Creatinine Clearance: 163.7 mL/min (by C-G formula based on Cr of 0.76).  Medical History: Past Medical History  Diagnosis Date  . Nerve root avulsion     Medications:  Prescriptions prior to admission  Medication Sig Dispense Refill Last Dose  . Calcium Carb-Cholecalciferol (CALCIUM PLUS VITAMIN D3) 600-500 MG-UNIT CAPS Take 1 capsule by mouth daily. 30 capsule 5 Past Month at Unknown time  . cloNIDine (CATAPRES) 0.1 MG tablet Take 1 tablet (0.1 mg total) by mouth 2 (two) times daily. 60 tablet 3 04/26/2014 at Unknown time  . DULoxetine (CYMBALTA) 60 MG capsule Take 1 capsule (60 mg total) by mouth daily. 60 capsule 11 04/26/2014 at Unknown time  . ondansetron (ZOFRAN ODT) 4 MG disintegrating tablet Take 1 tablet (4 mg total) by mouth every 8 (eight) hours as needed for nausea or vomiting. 10 tablet 0 Past Week at Unknown time  . oxycodone (ROXICODONE) 30 MG immediate release tablet Take 1 tablet (30 mg total) by mouth every 6 (six) hours as needed for pain. 120 tablet 0 04/26/2014 at Unknown time  . ibuprofen (ADVIL) 200 MG tablet Take 3 tablets (600 mg total) by mouth every 6 (six) hours as needed for mild pain or moderate pain. 90 tablet 0 unknown at unknown   Scheduled:  . cloNIDine  0.1 mg Oral BID  . DULoxetine  60 mg Oral Daily  . heparin  5,000 Units Subcutaneous 3 times per day  . LORazepam  1 mg Oral Once  . vancomycin  1,000 mg Intravenous Q8H     Assessment: 23yo male w/ chronic RUE paralysis c/o right hand redness extending to forearm x2d, worsened after he opened an abscess to the palmar surface of hand, reports foul-smelling green drainage, to begin IV ABX for cellulitis.  Goal of Therapy:  Vancomycin trough level 10-15 mcg/ml  Plan:  Will begin vancomycin 1g IV Q8H and monitor CBC, Cx, levels prn.  Vernard GamblesVeronda Oniya Mandarino, PharmD, BCPS  04/27/2014,1:58 AM

## 2014-04-27 NOTE — H&P (Signed)
Family Medicine Teaching [facility] Admission History and Physical Service Pager: [phone]  Patient name: Jeffery Baldwin Medical record number: [id number] Date of birth: 13-Jul-1990 Age: 23 y.o. Gender: male  Primary Care Provider: Jacquelin HawkingNettey, Ralph, MD Consultants: None  Code Status: Full  Chief Complaint: Right hand infection  Assessment and Plan: Jeffery Baldwin is a 23 y.o. male presenting with right hand abscess and cellulitis concerning for  MRSA . 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 - Continue home dose of oxycodone, 30 mg every 6 hours when necessary - Monitor closely for additional pain medication need, controlled at this time  Hypertension - Well controlled tonight BP ranging 123-142/7582 - Continue clonidine, this was used in the clinic due to anxiety and ADHD as well  Anxiety - Patient with history of social anxiety disorder and feeling anxious tonight, requests something for his nerves - One time dose of by mouth Ativan - Continue Cymbalta  FEN/GI: SLIV, regular diet Prophylaxis: sub q heparin  Disposition: MedSurg for IV antibiotics and close monitoring  History of Present Illness: Jeffery Baldwin is a 23 y.o. male  presentiright hand cellulitis and abscess. Patient states that his hand turn red about 2 days ago and he developed a blister which began draining this morning. He describes a draining as thick and green with a bad smell. He states that he has no additional pain that has continued pins and needles type neuropathic pain in his right hand. He states that his appetite has not been very good for the last 2 days but denies nausea, sweats, chills, or other systemic symptoms. He has chronic right arm and hand pain which is at its baseline. He states that the redness has spread up his arm throughout today. He denies any trauma to the area and states that the blister just showed up without inciting incident.   He feels anxious tonight and requests "something for his nerves".  Review Of Systems: Per HPI, Otherwise 12 point review of systems was performed and was unremarkable.  Patient Active Problem List   Diagnosis Date Noted  . Cellulitis of right hand 04/27/2014  . Gastroenteritis 04/10/2014  . Right arm pain 04/10/2014  . 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 Left leg injury 12/31/2013  . Left hand tendonitis 09/09/2013  . Counseling and coordination of care 05/12/2013  . Closed fracture of unspecified part of upper end of humerus 01/29/2013  . Obesity 05/17/2012  . Elevated BP 02/16/2012  . Chronic pain syndrome 02/16/2012  . Social anxiety disorder 08/25/2011  . Former smoker 09/13/2009  . 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 Nerve root avulsion    Past Surgical History: Past Surgical History  Procedure Laterality Date  . Nerve root  repair     Social History: History  Substance Use Topics  . Smoking status: Former Smoker -- 0.20 packs/day for 7 years    Types: Cigarettes  . Smokeless tobacco: Never Used     Comment: decreased smoking  . Alcohol Use: Yes     Comment: occasional   Additional social history: Please also refer to  relevant sections of EMR.  Family History: No family history on file. Allergies and Medications: Allergies  Allergen Reactions  . Ms Contin Rivka Safer[Morphine Sulfate] Nausea And Vomiting   No current facility-administered medications on file prior to encounter.   Current Outpatient Prescriptions on File Prior to Encounter  Medication Sig Dispense Refill  . Calcium Carb-Cholecalciferol (CALCIUM PLUS VITAMIN D3) 600-500 MG-UNIT CAPS Take 1 capsule by mouth daily. 30 capsule 5  . cloNIDine (CATAPRES) 0.1 MG tablet Take 1 tablet (0.1 mg total) by mouth 2 (two) times daily. 60 tablet 3  . DULoxetine (CYMBALTA) 60 MG capsule Take 1 capsule (60 mg total) by mouth daily. 60 capsule 11  . ondansetron (ZOFRAN ODT) 4 MG disintegrating tablet Take 1 tablet (4 mg total) by mouth every 8 (eight) hours as needed for nausea or vomiting. 10 tablet 0  . oxycodone (ROXICODONE) 30 MG immediate release tablet Take 1 tablet (30 mg total) by mouth every 6 (six) hours as needed for pain. 120 tablet 0  . ibuprofen (ADVIL) 200 MG tablet Take 3 tablets (600 mg total) by mouth every 6 (six) hours as needed for mild pain or moderate pain. 90 tablet 0    Objective: BP 123/82 mmHg  Pulse 94  Temp(Src) 98.4 F (36.9 C) (Oral)  Resp 18  Ht 5\' 11"  (1.803 m)  Wt 199 lb 4.8 oz (90.402 kg)  BMI 27.81 kg/m2  SpO2 100% Exam: Gen: NAD, alert, cooperative with exam HEENT: NCAT CV: RRR, good S1/S2, no murmur Resp: CTABL, no wheezes, non-labored Abd: SNTND, BS present, no guarding or organomegaly Ext: No edema, warm, 2+ DP pulses  Right upper extremity with clean dry intact bandage on right hand, right hand erythematous and tense, area approximately 3-4 cm in diameter and circular proximal to his hand with erythema and induration, one area of erythema more proximal to that similar in size with erythema but no induration. Neuro: Alert and oriented, apparent paralysis of his right arm   Labs and Imaging: CBC BMET   Recent  Labs Lab 04/27/14 0017  WBC 6.7  HGB 14.3  HCT 40.4  PLT 173    Recent Labs Lab 04/27/14 0017  NA 137  K 3.9  CL 98  CO2 22  BUN 10  CREATININE 0.76  GLUCOSE 121*  CALCIUM 9.8      Recent Labs Lab 04/27/14 0017  AST 32  ALT 27  ALKPHOS 132*  BILITOT 0.8  PROT 8.4*  ALBUMIN 4.1     Elenora GammaSamuel L Gayle Martinez, MD 04/27/2014, 1:51 AM PGY-3, [facility] FPTS Intern pager: [phone], text pages welcome

## 2014-04-27 NOTE — Consult Note (Addendum)
WOC wound consult note Reason for Consult:  WOC requested for right hand full thickness wound prior to involvement of hand surgical team. Pt states a physician just came in to assess his hand and re-wrapped the dressing. A consult has now been requested for hand team and an MRI has been ordered.   Please refer to their recommendations and re-consult if further assistance is needed. Cammie Mcgeeawn Martha Ellerby MSN, RN, CWOCN, SpurgeonWCN-AP, CNS [phone]

## 2014-04-27 NOTE — Consult Note (Signed)
ORTHOPAEDIC CONSULTATION HISTORY & PHYSICAL REQUESTING PHYSICIAN: Janit PaganKehinde Eniola, MD  Chief Complaint: right hand infection  HPI: Jessy OtoJesse J Lippmann is a 23 y.o. male who was admitted overnight with right hand wound and extending cellulitis.  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  He has burnt it in the past and not known it.  He cannot recall what lead to this, but developed a "blister" over the past 2 days, which broke and drained greenish material.  A wound culture has been obtained already.  An MRI is pending.  He and his family indicate that it has improved since being hospitalized.  Past Medical History  Diagnosis Date  . Nerve root avulsion    Past Surgical History  Procedure Laterality Date  . Nerve root repair     History   Social History  . Marital Status: Single    Spouse Name: N/A    Number of Children: N/A  . Years of Education: N/A   Social History Main Topics  . Smoking status: Former Smoker -- 0.20 packs/day for 7 years    Types: Cigarettes  . Smokeless tobacco: Never Used     Comment: decreased smoking  . Alcohol Use: Yes     Comment: occasional  . Drug Use: No  . Sexual Activity: Yes   Other Topics Concern  . None   Social History Narrative   No family history on file. Allergies  Allergen Reactions  . Ms Contin Rivka Safer[Morphine Sulfate] Nausea And Vomiting   Prior to Admission medications   Medication Sig Start Date End Date Taking? Authorizing Provider  Calcium Carb-Cholecalciferol (CALCIUM PLUS VITAMIN D3) 600-500 MG-UNIT CAPS Take 1 capsule by mouth daily. 04/10/14  Yes Jacquelin Hawkingalph Nettey, MD  cloNIDine (CATAPRES) 0.1 MG tablet Take 1 tablet (0.1 mg total) by mouth 2 (two) times daily. 02/16/14  Yes Jacquelin Hawkingalph Nettey, MD  DULoxetine (CYMBALTA) 60 MG capsule Take 1 capsule (60 mg total) by mouth daily. 10/10/13  Yes Andrena MewsMichael D Rigby, DO  ondansetron (ZOFRAN ODT) 4 MG disintegrating tablet Take 1 tablet (4 mg total) by mouth  every 8 (eight) hours as needed for nausea or vomiting. 04/10/14  Yes Jacquelin Hawkingalph Nettey, MD  oxycodone (ROXICODONE) 30 MG immediate release tablet Take 1 tablet (30 mg total) by mouth every 6 (six) hours as needed for pain. 04/19/14 05/19/14 Yes Jacquelin Hawkingalph Nettey, MD  ibuprofen (ADVIL) 200 MG tablet Take 3 tablets (600 mg total) by mouth every 6 (six) hours as needed for mild pain or moderate pain. 12/29/13   Jacquelin Hawkingalph Nettey, MD   No results found.  Positive ROS: All other systems have been reviewed and were otherwise negative with the exception of those mentioned in the HPI and as above.  Physical Exam: Vitals: Refer to EMR. Constitutional:  WD, WN, NAD HEENT:  NCAT, EOMI Neuro/Psych:  Alert & oriented to person, place, and time; appropriate mood & affect Lymphatic: No generalized extremity edema or lymphadenopathy Extremities / MSK:  The extremities are normal with respect to appearance, ranges of motion, joint stability, muscle strength/tone, sensation, & perfusion except as otherwise noted:  Right hand paralytic and with poor sensation.  3 cm ulceration at base of right index finger volarly, with epidermal slough at the margins.  I debrided this sharply to include skin and SQ tissue with scissors.  Good blood flow at margins.  No significant expressible drainage, no surrounding fluctuance.  Area of proximal induration extending to mid-forearm seems to  be receeding from the marked boundaries.    Assessment: Right hand infection, with open ulcer at volar base of IF.    Plan: It seems that any abscess has been already drained and is free to drain.  MRI will confirm whether there is any significant abscess not contiguous with the ulceration, and not able to drain thru it.  I will review MRI and leave final recommendations at that time.   Cliffton Astersavid A. Janee Mornhompson, MD      Orthopaedic & Hand Surgery [facility] [address] [place], [place]  [id number] Office:  [phone] Mobile: [phone]

## 2014-04-28 DIAGNOSIS — I10 Essential (primary) hypertension: Secondary | ICD-10-CM | POA: Insufficient documentation

## 2014-04-28 MED ORDER — VANCOMYCIN HCL IN DEXTROSE 1-5 GM/200ML-% IV SOLN
1000.0000 mg | Freq: Three times a day (TID) | INTRAVENOUS | Status: DC
  Administered 2014-04-28 – 2014-04-29 (×3): 1000 mg via INTRAVENOUS
  Filled 2014-04-28 (×5): qty 200

## 2014-04-28 MED ORDER — SENNA 8.6 MG PO TABS
2.0000 | ORAL_TABLET | Freq: Every day | ORAL | Status: DC
  Administered 2014-04-29: 17.2 mg via ORAL
  Filled 2014-04-28 (×3): qty 2

## 2014-04-28 MED ORDER — CLINDAMYCIN HCL 300 MG PO CAPS
450.0000 mg | ORAL_CAPSULE | Freq: Four times a day (QID) | ORAL | Status: DC
  Filled 2014-04-28 (×4): qty 1

## 2014-04-28 MED ORDER — PIPERACILLIN-TAZOBACTAM 3.375 G IVPB
3.3750 g | Freq: Three times a day (TID) | INTRAVENOUS | Status: DC
  Administered 2014-04-28 – 2014-04-29 (×3): 3.375 g via INTRAVENOUS
  Filled 2014-04-28 (×5): qty 50

## 2014-04-28 MED ORDER — HYDROCODONE-ACETAMINOPHEN 5-325 MG PO TABS
1.0000 | ORAL_TABLET | Freq: Once | ORAL | Status: AC
  Administered 2014-04-28: 1 via ORAL
  Filled 2014-04-28: qty 1

## 2014-04-28 MED ORDER — OXYCODONE HCL 5 MG PO TABS
30.0000 mg | ORAL_TABLET | ORAL | Status: DC | PRN
  Administered 2014-04-28 – 2014-04-30 (×11): 30 mg via ORAL
  Filled 2014-04-28 (×12): qty 6

## 2014-04-28 MED ORDER — MUPIROCIN CALCIUM 2 % EX CREA
TOPICAL_CREAM | Freq: Every day | CUTANEOUS | Status: DC
  Administered 2014-04-28 – 2014-04-29 (×2): via TOPICAL
  Administered 2014-04-30: 1 via TOPICAL
  Filled 2014-04-28: qty 15

## 2014-04-28 NOTE — Plan of Care (Signed)
Problem: Phase I Progression Outcomes Goal: Pain controlled with appropriate interventions Outcome: Progressing Goal: OOB as tolerated unless otherwise ordered Outcome: Completed/Met Date Met:  04/28/14 Goal: Voiding-avoid urinary catheter unless indicated Outcome: Completed/Met Date Met:  04/28/14 Goal: Temperature < 102 Outcome: Completed/Met Date Met:  04/28/14     

## 2014-04-28 NOTE — Progress Notes (Addendum)
Left arm redness receeding greatly.   Dressing intact, with dried drainage.  MRI without significant abscess.  No surgical indications at present. Patient likely to benefit from wound care--perhaps wound center would be appropriate for follow-up? Infection management (antibiotic selection, duration, etc.) per primary team or ID consult.  Please call me if clinical condition changes.

## 2014-04-28 NOTE — Consult Note (Addendum)
WOC wound consult note Reason for Consult: Consult requested for right palm area of hand.  Hand surgeon assessed site and performed debridement yesterday, then requested WOC for dressing change recommendations. Wound type: Full thickness Measurement:2.5X3X.3cm Wound bed: 95% beefy red, 5% dark red Drainage (amount, consistency, odor) Mod amt dark red drainage, no odor Periwound: Generalized edema and erythremia surrounding site extends to forearm and wrist area; pt states it has receded since antibiotics were started.   Dressing procedure/placement/frequency: Applied Mepitel silicone contact layer to decrease discomfort with dressing changes and avoid dressing from adhering to site,  Bactroban to provide antimicrobial benefits and promote moist healing and kerlex to protect.  Gauze wrapped around to separate fingers.  Gave patient extra supply of Mepitel for use at home. Hand surgeon who performed debridement recommends follow up at the outpatient wound care center.  Please order if desired; this can be arranged by the case manager.  Demonstrated dressing change to patient and he states a family member can perform daily after discharge.  He verbalizes understanding and denies further questions. Please re-consult if further assistance is needed.  Thank-you,  Cammie Mcgeeawn Dellar Traber MSN, RN, CWOCN, StrawnWCN-AP, CNS [phone]

## 2014-04-28 NOTE — Care Management Note (Signed)
CARE MANAGEMENT NOTE 04/28/2014  Patient:  Jeffery Baldwin,Jeffery Baldwin   Account Number:  [id number]  Date Initiated:  04/28/2014  Documentation initiated by:  Vance PeperBRADY,Sahej Schrieber  Subjective/Objective Assessment:   23 yr old male admitted with infection of right hand. Patient had an I & D.     Action/Plan:   Case manager contacted Wound Center [phone]. Arranged appointment for Tues. Dec.8th, 2015 @ 12:30pm. patient will be contacted if they can see him earlier.   Anticipated DC Date:  04/28/2014   Anticipated DC Plan:  HOME/SELF CARE      DC Planning Services  CM consult  Follow-up appt scheduled      PAC Choice  NA   Choice offered to / List presented to:     DME arranged  NA        HH arranged  NA      Status of service:  Completed, signed off Medicare Important Message given?   (If response is "NO", the following Medicare IM given date fields will be blank) Date Medicare IM given:   Medicare IM given by:   Date Additional Medicare IM given:   Additional Medicare IM given by:    Discharge Disposition:  HOME/SELF CARE  Per UR Regulation:  Reviewed for med. necessity/level of care/duration of stay  If discussed at Long Length of Stay Meetings, dates discussed:    Comments:

## 2014-04-28 NOTE — Progress Notes (Signed)
Family Medicine Teaching Service Daily Progress Note Intern Pager: [phone]  Patient name: Jeffery Baldwin Durbin Medical record number: [id number] Date of birth: 03/25/1991 Age: 23 y.o. Gender: male  Primary Care Provider: Jacquelin HawkingNettey, Ralph, MD Consultants: Ortho  Code Status: Full   Pt Overview and Major Events to Date:  11/23: Patient with R hand abscess and cellulitis. Started on IV vanc. MRI= no   Assessment and Plan: Jeffery Baldwin Clayborn is a 23 y.o. male presenting with right hand abscess and cellulitis concerning for MRSA . 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  #Right upper extremity assessment cellulitis: No constitutional symptoms, patient is hemodynamically stable.  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 - Continue home dose of oxycodone, 30 mg every 4 hours (from every 6hrs) when necessary - Will add senna give chronic narcotic use   #Hypertension - Well controlled/some hypotensive overnight to 90s/50s: patient asymptomatic  - Will hold home clonidine, this was used in the clinic due to anxiety and ADHD as well  #Anxiety - Patient with history of social anxiety disorder - Continue Cymbalta  FEN/GI: SLIV, regular diet Prophylaxis: sub q heparin  Disposition: Pending transition to PO antibiotics with good response  Subjective:  Patient feels his hand looks better. States the pain is worse after the debridement yesterday. Denies fevers/chills. Able to eat/drink ok but states his PO intake has been less that usual.   Objective: Temp:  [97.9 F (36.6 C)-98.8 F (37.1 C)] 97.9  F (36.6 C) (11/24 0600) Pulse Rate:  [56-77] 56 (11/24 0600) Resp:  [16] 16 (11/24 0600) BP: (99-125)/(55-66) 99/55 mmHg (11/24 0600) SpO2:  [96 %-99 %] 99 % (11/24 0600) Physical Exam: General: Lying in bed watching TV in NAD Cardiovascular: RRR, no m/r/g noted. 2+ DP pulses b/l Respiratory: CTAB with no wheezing, rhonchi, or crackles noted Abdomen: +BS, soft, non-tender, non-distended  Extremities: Right palm wound 2.5x3x3cm that is beefy red with a moderate amount of dark red drainage. Unable to express pus from the are. Generalized edema around the wound with erythema. Some improvement of the erythema compared to the markings from 11/23. There is a small nodular area over the palmar surface of the forearm, without fluctuance    Laboratory:  Recent Labs Lab 04/27/14 0017 04/27/14 0731  WBC 6.7 4.9  HGB 14.3 12.2*  HCT 40.4 34.9*  PLT 173 152    Recent Labs Lab 04/27/14 0017 04/27/14 0731  NA 137 139  K 3.9 3.9  CL 98 105  CO2 22 18*  BUN 10 8  CREATININE 0.76 0.72  CALCIUM 9.8 8.8  PROT 8.4*  --   BILITOT 0.8  --   ALKPHOS 132*  --   ALT 27  --   AST 32  --   GLUCOSE 121* 109*    Imaging/Diagnostic Tests: MRI R hand 04/28/14: Dorsal subcutaneous edema of the wrist and hand consistent with cellulitis. No abscess or osteomyelitis. No evidence of septic joint.   Joanna Puffrystal S Kamla Skilton, MD 04/28/2014, 8:17 AM PGY-1, [facility] FPTS Intern pager: [phone], text pages welcome

## 2014-04-29 MED ORDER — CLINDAMYCIN HCL 300 MG PO CAPS
450.0000 mg | ORAL_CAPSULE | Freq: Four times a day (QID) | ORAL | Status: DC
  Administered 2014-04-29 – 2014-04-30 (×5): 450 mg via ORAL
  Filled 2014-04-29 (×8): qty 1

## 2014-04-29 NOTE — Progress Notes (Signed)
Family Medicine Teaching Service Daily Progress Note Intern Pager: [phone]  Patient name: Jeffery Baldwin Medical record number: [id number] Date of birth: Feb 14, 1991 Age: 23 y.o. Gender: male  Primary Care Provider: Jacquelin HawkingNettey, Ralph, MD Consultants: Ortho  Code Status: Full   Pt Overview and Major Events to Date:  11/23: Patient with R hand abscess and cellulitis. Started on IV vanc. MRI= no osteomyelitis or abscess  Assessment and Plan: Jeffery Baldwin is a 23 y.o. male presenting with right hand abscess and cellulitis concerning for MRSA . 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  #Right upper extremity assessment cellulitis: No constitutional symptoms, patient is hemodynamically stable.  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 - Continue home dose of oxycodone, 30 mg every 4 hours (from every 6hrs) when necessary - Senna give chronic narcotic use   #Hypertension - Well controlled with BPs 110-100/60s - Continue to hold home clonidine, this was used in the clinic due to anxiety and ADHD as well  #Anxiety - Patient with history of social anxiety disorder - Continue Cymbalta  FEN/GI: SLIV, regular diet Prophylaxis: SQ heparin  Disposition: Pending transition to PO antibiotics with good response  Subjective:  Patient sleepy, pain managed with increased frequency of OxyIR Objective: Temp:  [97.9 F (36.6 C)-98 F (36.7 C)] 98 F (36.7 C) (11/25 0602) Pulse Rate:  [53-66] 53 (11/25 0602) Resp:  [16-18] 16 (11/25 0602) BP: (100-116)/(60-69) 107/60 mmHg  (11/25 0602) SpO2:  [98 %-100 %] 100 % (11/25 0602) Physical Exam: General: Lying in bed watching trying to sleep in NAD Cardiovascular: RRR, no m/r/g noted. 2+ DP pulses b/l Respiratory: CTAB with no wheezing, rhonchi, or crackles noted Abdomen: +BS, soft, non-tender, non-distended  Extremities: Right palm wound 2.5x3x3cm that is beefy red with a minimal amount of dark drainage. Unable to express pus from the area. Generalized edema around the wound with erythema. Approximately 3cm in diameter circular indurated area over the palmar surface of the forearm, without fluctuance. It does have some flaking over the area where the patient has scratched it.     Laboratory:  Recent Labs Lab 04/27/14 0017 04/27/14 0731  WBC 6.7 4.9  HGB 14.3 12.2*  HCT 40.4 34.9*  PLT 173 152    Recent Labs Lab 04/27/14 0017 04/27/14 0731  NA 137 139  K 3.9 3.9  CL 98 105  CO2 22 18*  BUN 10 8  CREATININE 0.76 0.72  CALCIUM 9.8 8.8  PROT 8.4*  --   BILITOT 0.8  --   ALKPHOS 132*  --   ALT 27  --   AST 32  --   GLUCOSE 121* 109*    Imaging/Diagnostic Tests: MRI R hand 04/28/14: Dorsal subcutaneous edema of the wrist and hand consistent with cellulitis. No abscess or osteomyelitis. No evidence of septic joint.   Joanna Puffrystal S Shavonne Ambroise, MD 04/29/2014, 8:28 AM PGY-1, [facility] FPTS Intern pager: [phone], text pages welcome

## 2014-04-29 NOTE — Plan of Care (Signed)
Problem: Phase I Progression Outcomes Goal: Hemodynamically stable Outcome: Completed/Met Date Met:  04/29/14 Goal: Wound assessment- dressing change as appropriate Outcome: Completed/Met Date Met:  04/29/14  Problem: Phase II Progression Outcomes Goal: Progress activity as tolerated unless otherwise ordered Outcome: Completed/Met Date Met:  04/29/14

## 2014-04-29 NOTE — Plan of Care (Signed)
Problem: Phase I Progression Outcomes Goal: Pain controlled with appropriate interventions Outcome: Completed/Met Date Met:  04/29/14  Problem: Phase II Progression Outcomes Goal: Temperature < 101 Outcome: Completed/Met Date Met:  04/29/14 Goal: Tolerating diet Outcome: Completed/Met Date Met:  04/29/14

## 2014-04-30 DIAGNOSIS — L02519 Cutaneous abscess of unspecified hand: Secondary | ICD-10-CM

## 2014-04-30 DIAGNOSIS — L03119 Cellulitis of unspecified part of limb: Secondary | ICD-10-CM | POA: Insufficient documentation

## 2014-04-30 DIAGNOSIS — G894 Chronic pain syndrome: Secondary | ICD-10-CM

## 2014-04-30 MED ORDER — CLINDAMYCIN HCL 150 MG PO CAPS: 450.0000 mg | ORAL_CAPSULE | Freq: Four times a day (QID) | ORAL | Status: DC

## 2014-04-30 MED ORDER — OXYCODONE HCL 30 MG PO TABS: 30.0000 mg | ORAL_TABLET | Freq: Four times a day (QID) | ORAL | Status: DC | PRN

## 2014-04-30 MED ORDER — MUPIROCIN CALCIUM 2 % EX CREA: TOPICAL_CREAM | Freq: Every day | CUTANEOUS | Status: DC

## 2014-04-30 NOTE — Progress Notes (Signed)
Left arm redness essentially gone Wound looking very good, with good healthy granulation Spoke with him about wound care between discharge and 1st wound center f/u He is fine to do it himself, so will d/c home health.

## 2014-04-30 NOTE — Plan of Care (Signed)
Problem: Consults Goal: Cellulitis Patient Education See Patient Education Module for education specifics.  Outcome: Completed/Met Date Met:  04/30/14 Goal: Skin Care Protocol Initiated - if Braden Score 18 or less If consults are not indicated, leave blank or document N/A  Outcome: Not Applicable Date Met:  78/29/56 Goal: Nutrition Consult-if indicated Outcome: Not Applicable Date Met:  21/30/86  Problem: Phase I Progression Outcomes Goal: Initial discharge plan identified Outcome: Completed/Met Date Met:  04/30/14 Goal: Other Phase I Outcomes/Goals Outcome: Completed/Met Date Met:  04/30/14  Problem: Phase II Progression Outcomes Goal: Discharge plan established Outcome: Completed/Met Date Met:  04/30/14 Goal: Wound without signs/symptoms of infection, decreasing edema Outcome: Adequate for Discharge Goal: Other Phase II Outcomes/Goals Outcome: Completed/Met Date Met:  04/30/14  Problem: Phase III Progression Outcomes Goal: Activity at appropriate level-compared to baseline (UP IN CHAIR FOR HEMODIALYSIS)  Outcome: Completed/Met Date Met:  04/30/14 Goal: Temperature < 100 Outcome: Completed/Met Date Met:  04/30/14 Goal: Discharge plan remains appropriate-arrangements made Outcome: Completed/Met Date Met:  04/30/14 Goal: IV Meds changed to PO Outcome: Completed/Met Date Met:  04/30/14 Goal: Wound care performed by pt/family Outcome: Completed/Met Date Met:  04/30/14 Goal: Other Phase III Outcomes/Goals Outcome: Completed/Met Date Met:  04/30/14  Problem: Discharge Progression Outcomes Goal: Barriers To Progression Addressed/Resolved Outcome: Completed/Met Date Met:  04/30/14 Goal: Discharge plan in place and appropriate Outcome: Completed/Met Date Met:  04/30/14 Goal: Pain controlled with appropriate interventions Outcome: Completed/Met Date Met:  04/30/14 Goal: Hemodynamically stable Outcome: Completed/Met Date Met:  04/30/14 Goal: Tolerating diet Outcome:  Completed/Met Date Met:  04/30/14 Goal: Activity appropriate for discharge plan Outcome: Completed/Met Date Met:  04/30/14 Goal: [place] arrangements in place Outcome: Completed/Met Date Met:  04/30/14 Goal: Wound improving/decreased edema Outcome: Completed/Met Date Met:  04/30/14 Goal: Patient verbalizes wound care regimen Outcome: Completed/Met Date Met:  04/30/14 Goal: Other Discharge Outcomes/Goals Outcome: Completed/Met Date Met:  04/30/14

## 2014-04-30 NOTE — Discharge Instructions (Signed)
You were admitted for an infection in your hand. You will need to continue taking antibiotics for 10 more days and follow-up in our clinic and with the wound center.

## 2014-04-30 NOTE — Progress Notes (Signed)
Family Medicine Teaching Service Daily Progress Note Intern Pager: [phone]  Patient name: Jeffery OtoJesse J Kishi Medical record number: [id number] Date of birth: [birth year] Age: 23 y.o. Gender: male  Primary Care Provider: Jacquelin HawkingNettey, Ralph, MD Consultants: Ortho  Code Status: Full   Pt Overview and Major Events to Date:  11/23: Patient with R hand abscess and cellulitis. Started on IV vanc. MRI= no osteomyelitis or abscess  Assessment and Plan: Jeffery OtoJesse J Pesantez is a 23 y.o. male presenting with right hand abscess and cellulitis concerning for MRSA . 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  #Right upper extremity assessment cellulitis: No constitutional symptoms, patient is hemodynamically stable.  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 - See above for escalated pain regimen - Senna given chronic narcotic use   #Hypertension - Well controlled with BPs 110-100/60s - Continue to hold home clonidine, this was used in the clinic due to anxiety and ADHD as well  #Anxiety - Patient with history of social anxiety disorder - Continue Cymbalta  FEN/GI: SLIV, regular diet Prophylaxis: SQ heparin  Disposition: Home today on po clinda with wound center f/u next week  Subjective:  Feels better, pain well controlled, wants to go home  Objective: Temp:  [98 F (36.7 C)-98.1 F (36.7 C)] 98 F (36.7 C) (11/26 0623) Pulse Rate:  [61-85] 85 (11/26 0623) Resp:  [16-18] 16 (11/26 0623) BP: (112-121)/(65-73) 121/73 mmHg (11/26 0623) SpO2:  [98 %-100 %] 98 % (11/26 25360623) Physical Exam: General: Lying in bed in NAD Cardiovascular: RRR, no  m/r/g noted. 2+ DP pulses b/l Respiratory: CTAB with no wheezing, rhonchi, or crackles noted Abdomen: +BS, soft, non-tender, non-distended  Extremities: Right palm wound dressed, no surrounding erythema visible. Approximately 2cm in diameter circular indurated area over the palmar surface of the forearm, without fluctuance.  Laboratory:  Recent Labs Lab 04/27/14 0017 04/27/14 0731  WBC 6.7 4.9  HGB 14.3 12.2*  HCT 40.4 34.9*  PLT 173 152    Recent Labs Lab 04/27/14 0017 04/27/14 0731  NA 137 139  K 3.9 3.9  CL 98 105  CO2 22 18*  BUN 10 8  CREATININE 0.76 0.72  CALCIUM 9.8 8.8  PROT 8.4*  --   BILITOT 0.8  --   ALKPHOS 132*  --   ALT 27  --   AST 32  --   GLUCOSE 121* 109*    Imaging/Diagnostic Tests: MRI R hand 04/28/14: Dorsal subcutaneous edema of the wrist and hand consistent with cellulitis. No abscess or osteomyelitis. No evidence of septic joint.   Abram SanderElena M Vesta [place]eland, MD 04/30/2014, 8:23 AM PGY-2, [facility] FPTS Intern pager: [phone], text pages welcome

## 2014-05-01 NOTE — Discharge Summary (Signed)
Family Medicine Teaching [facility] Discharge Summary  Patient name: Jeffery OtoJesse J Baldwin Medical record number: [id number] Date of birth: Mar 10, 1991 Age: 23 y.o. Gender: male Date of Admission: 04/26/2014  Date of Discharge: 04/30/14 Admitting Physician: Janit PaganKehinde Eniola, MD  Primary Care Provider: Jacquelin HawkingNettey, Ralph, MD Consultants: Ortho   Indication for Hospitalization: Cellulitis  Discharge Diagnoses/Problem List:  Right palmar cellulitis Chronic pain due to cervical nerve damage Hypertension Anxiety  Disposition: Home with HH-RN  Discharge Condition: Stable, improved  Brief Hospital Course:  Mr. Wilson SingerHudson is a 23 y/o with a PMH chronic pain syndrome 2/2 a cervical nerve damage presenting with right palmar hand cellulitis and abscess which initially started as a blister and progressed to draining a thick, green discharge that was malodorous.    He was started on IV Vancomycin in the ED and admitted to the FPTS. He was subsequently started on Zosyn. Hand surgery was contuled who debrided the wound at the bedside on 04/27/14.  An MRI was obtained that was negative for abscess or osteomyelitis; patient underwent bedside debridement by hand orthopedic service. He was continued on IV antibiotics until 11/25 when he was transitioned to clindamycin PO with continued improvement. He was discharged with home health and f/u with the Wound Center.   Issues for Follow Up:  --f/u palmar wound; additionally f/u indurated area over the palmar aspect of the forearm (pt notes he has been picking at this area).  Significant Procedures:  11/23: R palmar wound debridement   Significant Labs and Imaging:   Recent Labs Lab 04/27/14 0017 04/27/14 0731  WBC 6.7 4.9  HGB 14.3 12.2*  HCT 40.4 34.9*  PLT 173 152    Recent Labs Lab 04/27/14 0017 04/27/14 0731  NA 137 139  K 3.9 3.9  CL 98 105  CO2 22 18*  GLUCOSE 121* 109*  BUN 10 8  CREATININE 0.76 0.72  CALCIUM 9.8 8.8  ALKPHOS 132*  --    AST 32  --   ALT 27  --   ALBUMIN 4.1  --     MRI R hand 04/28/14: Dorsal subcutaneous edema of the wrist and hand consistent with cellulitis. No abscess or osteomyelitis. No evidence of septic joint.   Results/Tests Pending at Time of Discharge: None   Discharge Medications:    Medication List    STOP taking these medications        cloNIDine 0.1 MG tablet  Commonly known as:  CATAPRES      TAKE these medications        Calcium Carb-Cholecalciferol 600-500 MG-UNIT Caps  Commonly known as:  CALCIUM PLUS VITAMIN D3  Take 1 capsule by mouth daily.     clindamycin 150 MG capsule  Commonly known as:  CLEOCIN  Take 3 capsules (450 mg total) by mouth 4 (four) times daily.     DULoxetine 60 MG capsule  Commonly known as:  CYMBALTA  Take 1 capsule (60 mg total) by mouth daily.     ibuprofen 200 MG tablet  Commonly known as:  ADVIL  Take 3 tablets (600 mg total) by mouth every 6 (six) hours as needed for mild pain or moderate pain.     mupirocin cream 2 %  Commonly known as:  BACTROBAN  Apply topically daily.     ondansetron 4 MG disintegrating tablet  Commonly known as:  ZOFRAN ODT  Take 1 tablet (4 mg total) by mouth every 8 (eight) hours as needed for nausea or vomiting.  oxycodone 30 MG immediate release tablet  Commonly known as:  ROXICODONE  Take 1-2 tablets (30-60 mg total) by mouth every 6 (six) hours as needed for pain.        Discharge Instructions: Please refer to Patient Instructions section of EMR for full details.  Patient was counseled important signs and symptoms that should prompt return to medical care, changes in medications, dietary instructions, activity restrictions, and follow up appointments.   Follow-Up Appointments: Follow-up Information    Follow up with [facility]             .   Why:  Your appointment has been scheduled for Tuesday, May 12, 2014 at 12:30pm **You will be contacted if they get an  earlier appointment**   Contact information:   [address] [place] [phone]      Follow up with Jacquelin HawkingNettey, Ralph, MD On 05/07/2014.   Specialty:  Family Medicine   Why:  at 2:15pm for a hospital follow up    Contact information:   [address] [place] [address] [phone]       Joanna Puffrystal S Dorsey, MD 05/02/2014, 5:20 PM PGY-1, [facility]

## 2014-05-07 ENCOUNTER — Inpatient Hospital Stay: Payer: Self-pay | Admitting: Family Medicine

## 2014-05-12 ENCOUNTER — Encounter (HOSPITAL_BASED_OUTPATIENT_CLINIC_OR_DEPARTMENT_OTHER): Payer: Self-pay | Attending: General Surgery

## 2014-05-22 ENCOUNTER — Ambulatory Visit: Payer: Self-pay | Admitting: Family Medicine

## 2014-05-22 ENCOUNTER — Ambulatory Visit (INDEPENDENT_AMBULATORY_CARE_PROVIDER_SITE_OTHER): Payer: Self-pay | Admitting: Family Medicine

## 2014-05-22 VITALS — BP 130/84 | HR 120 | Temp 97.9°F | Ht 71.0 in | Wt 200.4 lb

## 2014-05-22 DIAGNOSIS — L03119 Cellulitis of unspecified part of limb: Secondary | ICD-10-CM

## 2014-05-22 DIAGNOSIS — L02519 Cutaneous abscess of unspecified hand: Secondary | ICD-10-CM

## 2014-05-22 DIAGNOSIS — G894 Chronic pain syndrome: Secondary | ICD-10-CM

## 2014-05-22 DIAGNOSIS — F411 Generalized anxiety disorder: Secondary | ICD-10-CM

## 2014-05-22 MED ORDER — OXYCODONE HCL 30 MG PO TABS: 30.0000 mg | ORAL_TABLET | Freq: Three times a day (TID) | ORAL | Status: DC | PRN

## 2014-05-22 MED ORDER — CITALOPRAM HYDROBROMIDE 20 MG PO TABS: 20.0000 mg | ORAL_TABLET | Freq: Every day | ORAL | Status: DC

## 2014-05-22 NOTE — Patient Instructions (Addendum)
Thank you for coming to see me today. It was a pleasure. Today we talked about:   Anxiety: We will try Celexa to see if this helps with your symptoms. Please give this some time to work.  Chronic pain: We will decrease your oxycodone to 30mg  three times per day.  Hand abscess: your hand looks great. I'm glad it has healed so well.  Please make an appointment to see me in 4 weeks for follow-up.  If you have any questions or concerns, please do not hesitate to call the office at [phone].  Sincerely,  Jacquelin Hawkingalph Benton Tooker, MD   Citalopram tablets What is this medicine? CITALOPRAM (sye TAL oh pram) is a medicine for depression. This medicine may be used for other purposes; ask your health care provider or pharmacist if you have questions. COMMON BRAND NAME(S): Celexa What should I tell my health care provider before I take this medicine? They need to know if you have any of these conditions: -bipolar disorder or a family history of bipolar disorder -diabetes -glaucoma -heart disease -history of irregular heartbeat -kidney or liver disease -low levels of magnesium or potassium in the blood -receiving electroconvulsive therapy -seizures (convulsions) -suicidal thoughts or a previous suicide attempt -an unusual or allergic reaction to citalopram, escitalopram, other medicines, foods, dyes, or preservatives -pregnant or trying to become pregnant -breast-feeding How should I use this medicine? Take this medicine by mouth with a glass of water. Follow the directions on the prescription label. You can take it with or without food. Take your medicine at regular intervals. Do not take your medicine more often than directed. Do not stop taking this medicine suddenly except upon the advice of your doctor. Stopping this medicine too quickly may cause serious side effects or your condition may worsen. A special MedGuide will be given to you by the pharmacist with each prescription and refill. Be  sure to read this information carefully each time. Talk to your pediatrician regarding the use of this medicine in children. Special care may be needed. Patients over 607 years old may have a stronger reaction and need a smaller dose. Overdosage: If you think you have taken too much of this medicine contact a poison control center or emergency room at once. NOTE: This medicine is only for you. Do not share this medicine with others. What if I miss a dose? If you miss a dose, take it as soon as you can. If it is almost time for your next dose, take only that dose. Do not take double or extra doses. What may interact with this medicine? Do not take this medicine with any of the following medications: -certain medicines for fungal infections like fluconazole, itraconazole, ketoconazole, posaconazole, voriconazole -cisapride -dofetilide -dronedarone -escitalopram -linezolid -MAOIs like Carbex, Eldepryl, Marplan, Nardil, and Parnate -methylene blue (injected into a vein) -pimozide -thioridazine -ziprasidone This medicine may also interact with the following medications: -alcohol -aspirin and aspirin-like medicines -carbamazepine -certain medicines for depression, anxiety, or psychotic disturbances -certain medicines for infections like chloroquine, clarithromycin, erythromycin, furazolidone, isoniazid, pentamidine -certain medicines for migraine headaches like almotriptan, eletriptan, frovatriptan, naratriptan, rizatriptan, sumatriptan, zolmitriptan -certain medicines for sleep -certain medicines that treat or prevent blood clots like dalteparin, enoxaparin, warfarin -cimetidine -diuretics -fentanyl -lithium -methadone -metoprolol -NSAIDs, medicines for pain and inflammation, like ibuprofen or naproxen -omeprazole -other medicines that prolong the QT interval (cause an abnormal heart rhythm) -procarbazine -rasagiline -supplements like St. John's wort, kava kava,  valerian -tramadol -tryptophan This list may not describe all  possible interactions. Give your health care provider a list of all the medicines, herbs, non-prescription drugs, or dietary supplements you use. Also tell them if you smoke, drink alcohol, or use illegal drugs. Some items may interact with your medicine. What should I watch for while using this medicine? Tell your doctor if your symptoms do not get better or if they get worse. Visit your doctor or health care professional for regular checks on your progress. Because it may take several weeks to see the full effects of this medicine, it is important to continue your treatment as prescribed by your doctor. Patients and their families should watch out for new or worsening thoughts of suicide or depression. Also watch out for sudden changes in feelings such as feeling anxious, agitated, panicky, irritable, hostile, aggressive, impulsive, severely restless, overly excited and hyperactive, or not being able to sleep. If this happens, especially at the beginning of treatment or after a change in dose, call your health care professional. Bonita QuinYou may get drowsy or dizzy. Do not drive, use machinery, or do anything that needs mental alertness until you know how this medicine affects you. Do not stand or sit up quickly, especially if you are an older patient. This reduces the risk of dizzy or fainting spells. Alcohol may interfere with the effect of this medicine. Avoid alcoholic drinks. Your mouth may get dry. Chewing sugarless gum or sucking hard candy, and drinking plenty of water will help. Contact your doctor if the problem does not go away or is severe. What side effects may I notice from receiving this medicine? Side effects that you should report to your doctor or health care professional as soon as possible: -allergic reactions like skin rash, itching or hives, swelling of the face, lips, or tongue -chest pain -confusion -dizziness -fast,  irregular heartbeat -fast talking and excited feelings or actions that are out of control -feeling faint or lightheaded, falls -hallucination, loss of contact with reality -seizures -shortness of breath -suicidal thoughts or other mood changes -unusual bleeding or bruising Side effects that usually do not require medical attention (report to your doctor or health care professional if they continue or are bothersome): -blurred vision -change in appetite -change in sex drive or performance -headache -increased sweating -nausea -trouble sleeping This list may not describe all possible side effects. Call your doctor for medical advice about side effects. You may report side effects to FDA at 1-800-FDA-1088. Where should I keep my medicine? Keep out of reach of children. Store at room temperature between 15 and 30 degrees C (59 and 86 degrees F). Throw away any unused medicine after the expiration date. NOTE: This sheet is a summary. It may not cover all possible information. If you have questions about this medicine, talk to your doctor, pharmacist, or health care provider.  2015, Elsevier/Gold Standard. (2012-12-13 13:19:48)

## 2014-05-22 NOTE — Progress Notes (Signed)
    Subjective    Jeffery Baldwin is a 23 y.o. male that presents for an office visit.   1. Hospital follow-up for hand abscess: patient has been going to wound clinic. Symptoms have improved. No discharge. No fevers. Antibiotics have been completed.   2. Anxiety: Present when around large groups. He has had this since he was 17. He has been coping by isolating himself and avoiding situation with other people. When he is in situations with a lot of people, he has sweating, palpitations and agitated. Occurs at school and in class. He did not see much of an improvement while on the Cymbalta. He has had therapy as a child but not as an adult.   3. Chronic pain: Controlled on percocet. Patient has been taking up to 6 tablets at a time. The last prescription written s/p discharge from the hospital instructed him to take 30mg -60mg  q6 hours. No concerns today.  History  Substance Use Topics  . Smoking status: Former Smoker -- 0.20 packs/day for 7 years    Types: Cigarettes  . Smokeless tobacco: Never Used     Comment: decreased smoking  . Alcohol Use: Yes     Comment: occasional    Allergies  Allergen Reactions  . Ms Contin Rivka Safer[Morphine Sulfate] Nausea And Vomiting    No orders of the defined types were placed in this encounter.    ROS  Per HPI   Objective   BP 130/84 mmHg  Pulse 120  Temp(Src) 97.9 F (36.6 C) (Oral)  Ht 5\' 11"  (1.803 m)  Wt 200 lb 6.4 oz (90.901 kg)  BMI 27.96 kg/m2  General: Well appearing male Skin: lesion on right hand with surrounding slight erythema, no tenderness, no fluctuance       Assessment and Plan   Please refer to problem based charting of assessment and plan

## 2014-05-23 DIAGNOSIS — F411 Generalized anxiety disorder: Secondary | ICD-10-CM | POA: Insufficient documentation

## 2014-05-23 NOTE — Assessment & Plan Note (Signed)
Appears to be healing well.  Patient to continue wrapping and following up at wound care clinic

## 2014-05-23 NOTE — Assessment & Plan Note (Signed)
Patient is on Percocet  Decrease frequency to TID as patient reluctant to go down to 20mg  q6hours. Currently taking 90mg  daily  Plan is to continue to decrease daily dose. Although patient will most likely show resistance, I believe this will be safer for him

## 2014-05-23 NOTE — Assessment & Plan Note (Signed)
Patient has been on Celexa in the past with minimal improvement. Anxiety mildly impair patient. Patient is wanting medication to help with his symptoms  Celexa 20mg  daily  Recommended concurrent behavioral therapy. Patient states he heard of [facility] and some other places and would like to go there

## 2014-06-10 ENCOUNTER — Ambulatory Visit (INDEPENDENT_AMBULATORY_CARE_PROVIDER_SITE_OTHER): Payer: Self-pay | Admitting: Family Medicine

## 2014-06-10 VITALS — BP 138/86 | HR 110 | Temp 98.4°F | Ht 71.0 in | Wt 208.1 lb

## 2014-06-10 DIAGNOSIS — G894 Chronic pain syndrome: Secondary | ICD-10-CM

## 2014-06-10 MED ORDER — OXYCODONE HCL 30 MG PO TABS: 30.0000 mg | ORAL_TABLET | Freq: Three times a day (TID) | ORAL | Status: DC | PRN

## 2014-06-10 MED ORDER — AMITRIPTYLINE HCL 50 MG PO TABS: 50.0000 mg | ORAL_TABLET | Freq: Every day | ORAL | Status: DC

## 2014-06-10 NOTE — Patient Instructions (Addendum)
Thank you for coming to see me today. It was a pleasure. Today we talked about:   Chronic pain: I have added amitriptyline to your regimen. Please start taking Amitriptyline 50mg  every night before bedtime. We will work at this dose and increase if needed to help with your pain. I have refilled your oxycodone but it is postdated for 06/21/2014.  Please make an appointment to see me in 6 weeks for follow-up.  If you have any questions or concerns, please do not hesitate to call the office at [phone].  Sincerely,  Jacquelin Hawkingalph Rosamaria Donn, MD

## 2014-06-10 NOTE — Progress Notes (Signed)
    Subjective    Jeffery Baldwin is a 24 y.o. male that presents for an office visit.   1. Chronic pain: Patient states that he is not tolerating decrease in medication. He states that he started working and with the cold, he is having pain. States he would prefer to be on the higher doses of Oxycodone 30mg  QID. States he has pain in his right shoulder although he does not use his right arm for function. Patient states he has been using the medication different as has been prescribed and has run out of his medication.  History  Substance Use Topics  . Smoking status: Former Smoker -- 0.20 packs/day for 7 years    Types: Cigarettes  . Smokeless tobacco: Never Used     Comment: decreased smoking  . Alcohol Use: Yes     Comment: occasional    Allergies  Allergen Reactions  . Ms Contin Rivka Safer[Morphine Sulfate] Nausea And Vomiting    No orders of the defined types were placed in this encounter.    ROS  Per HPI   Objective   BP 138/86 mmHg  Pulse 110  Temp(Src) 98.4 F (36.9 C) (Oral)  Ht 5\' 11"  (1.803 m)  Wt 208 lb 1.6 oz (94.394 kg)  BMI 29.04 kg/m2  General: Well appearing male in no distress  Assessment and Plan   Please refer to problem based charting of assessment and plan

## 2014-06-13 ENCOUNTER — Telehealth: Payer: Self-pay | Admitting: Family Medicine

## 2014-06-13 ENCOUNTER — Encounter (HOSPITAL_COMMUNITY): Payer: Self-pay | Admitting: Emergency Medicine

## 2014-06-13 ENCOUNTER — Emergency Department (HOSPITAL_COMMUNITY)
Admission: EM | Admit: 2014-06-13 | Discharge: 2014-06-13 | Disposition: A | Discharge status: Home / Self Care | Payer: Self-pay | Attending: Emergency Medicine | Admitting: Emergency Medicine

## 2014-06-13 DIAGNOSIS — M79601 Pain in right arm: Secondary | ICD-10-CM | POA: Insufficient documentation

## 2014-06-13 DIAGNOSIS — G8921 Chronic pain due to trauma: Secondary | ICD-10-CM | POA: Insufficient documentation

## 2014-06-13 DIAGNOSIS — Z79899 Other long term (current) drug therapy: Secondary | ICD-10-CM | POA: Insufficient documentation

## 2014-06-13 DIAGNOSIS — Z720 Tobacco use: Secondary | ICD-10-CM | POA: Insufficient documentation

## 2014-06-13 DIAGNOSIS — G8929 Other chronic pain: Secondary | ICD-10-CM

## 2014-06-13 DIAGNOSIS — Z87828 Personal history of other (healed) physical injury and trauma: Secondary | ICD-10-CM | POA: Insufficient documentation

## 2014-06-13 HISTORY — DX: Unspecified injury at C5 level of cervical spinal cord, initial encounter: S14.105A

## 2014-06-13 MED ORDER — HYDROMORPHONE HCL 2 MG/ML IJ SOLN
2.0000 mg | Freq: Once | INTRAMUSCULAR | Status: AC
  Administered 2014-06-13: 2 mg via INTRAMUSCULAR
  Filled 2014-06-13: qty 1

## 2014-06-13 NOTE — ED Notes (Signed)
Pt arrived to the ED with a complaint of right arm pain. Pt has a spinal cord injury from four years ago.  Pt has been taking medications for pain and has been switch to new medication but has obtained no relief for pain.

## 2014-06-13 NOTE — Assessment & Plan Note (Addendum)
Patient states he is not tolerating decrease in medication. Currently on Oxycodone 30mg  TID down from QID. Patient's pain is in his right shoulder, which is not functional. Patient reminded about pain contract that he signed. Discussed that I will not be increasing current dose. Patient voiced disagreement with this plan, but I stated that I did not feel it safe for the patient to continue on such a high dose of narcotics. Offered options including 1) adjunctive therapy with TCA since this pain seems probably more related to nerve pain (patient states he did not do well on gabapentin or Lyrica and that Cymbalta did not work for him) and 2) referral to pain clinic for continued management of his chronic pain. Patient opted for option #1.  Start Amitriptyline 50mg  qhs  Continue Oxycodone 30mg  TID, post dated for 06/21/2014  Follow-up in one month

## 2014-06-13 NOTE — Telephone Encounter (Signed)
[facility]  Patient calls because he is out of his pain medication. He was prescribed 120 tab/month oxycodone for 3 years that was decreased last month to 90 tab (TID dosing) and cannot get next rx filled until next Sunday (8 days from now). He requests help with this. I informed him per clinic policy we do not do chronic pain medication refills over the phone and that nationally this has begun to be more controlled. I also informed him our clinic policy includes regular visits to manage this and in-person visits for rx. I told him I would forward to his PCP for review but that the best bet would be to call Monday for appointment. He stated arm / shoulder has been fractured in the past and hurting more lately due to work and asked if he could go to the ER. I informed him this is always an option for acute severe worse pain but if it is not for an acute issue, would possibly be in violation of his pain contract. Again reinforced that if no acute issue, best bet would be [facility].  On review of chart, he was seen in clinic 06/10/14 by PCP who decided not to titrate up to oxycodone 30mg  QID from current TID rx.   Pt voiced understanding.  Leona SingletonMaria T Sianne Tejada, MD

## 2014-06-13 NOTE — Discharge Instructions (Signed)
Follow up with your doctor for further pain control and medication management.

## 2014-06-13 NOTE — ED Provider Notes (Signed)
CSN: [id number]     Arrival date & time 06/13/14  2047 History  This chart was scribed for non-physician practitioner Emilia Beck, PA-C working with Gerhard Munch, MD, by Modena Jansky, ED Scribe. This patient was seen in room WTR7/WTR7 and the patient's care was started at 9:32 PM.   Chief Complaint  Patient presents with  . Arm Pain   Patient is a 24 y.o. male presenting with arm pain. The history is provided by the patient. No language interpreter was used.  Arm Pain This is a recurrent problem. The current episode started yesterday. The problem occurs constantly. The problem has not changed since onset.Nothing aggravates the symptoms. Nothing relieves the symptoms. He has tried nothing for the symptoms.   HPI Comments: Jeffery Baldwin is a 24 y.o. male who presents to the Emergency Department complaining of constant moderate right arm pain that started yesterday. He reports that his right arm is broken and he is out of of his pain medication. He states that he hasn't had any medication since yesterday. He reports that he has been on the same medication for 3 months, but recently his doctor lowered the dosage.   Past Medical History  Diagnosis Date  . Spinal cord injury at C5-C7 level without injury of spinal bone 2011   No past surgical history on file. No family history on file. History  Substance Use Topics  . Smoking status: Current Some Day Smoker  . Smokeless tobacco: Never Used  . Alcohol Use: No    Review of Systems  Musculoskeletal: Positive for myalgias.  All other systems reviewed and are negative.   Allergies  Morphine and related  Home Medications   Prior to Admission medications   Medication Sig Start Date End Date Taking? Authorizing Provider  FLUoxetine (PROZAC) 10 MG capsule Take 10 mg by mouth daily.   Yes Historical Provider, MD  oxycodone (ROXICODONE) 30 MG immediate release tablet Take 30 mg by mouth every 8 (eight) hours as needed. for pain 05/22/14   Yes Historical Provider, MD   BP 146/73 mmHg  Pulse 113  Temp(Src) 98.6 F (37 C) (Oral)  Resp 16  SpO2 100% Physical Exam  Constitutional: He is oriented to person, place, and time. He appears well-developed and well-nourished. No distress.  HENT:  Head: Normocephalic and atraumatic.  Eyes: Conjunctivae are normal.  Neck: Neck supple. No tracheal deviation present.  Cardiovascular: Normal rate and regular rhythm.  Exam reveals no gallop and no friction rub.   No murmur heard. Pulmonary/Chest: Effort normal and breath sounds normal. No respiratory distress. He has no wheezes. He has no rales. He exhibits no tenderness.  Abdominal: Soft. He exhibits no distension. There is no tenderness.  Musculoskeletal: Normal range of motion.  Limited ROM of right elbow and shoulder due to pain. No obvious deformity.   Neurological: He is alert and oriented to person, place, and time.  Bilateral grip strength equal and intact bilaterally. Speech is goal-oriented. Moves limbs without ataxia.   Skin: Skin is warm and dry.  Psychiatric: He has a normal mood and affect. His behavior is normal.  Nursing note and vitals reviewed.   ED Course  Procedures (including critical care time) DIAGNOSTIC STUDIES: Oxygen Saturation is 100% on RA, normal by my interpretation.    COORDINATION OF CARE: 9:36 PM- Pt advised of plan for treatment and pt agrees.  Labs Review Labs Reviewed - No data to display  Imaging Review No results found.   EKG Interpretation None  MDM   Final diagnoses:  Chronic pain of right upper extremity    9:58 PM Patient is has chronic pain and gets 30mg  oxycodone from his PCP regularly. Patient last received 90 tablets on 12.18.2015 and reports being out of his medication. Patient told me he only received 30 tablets in this prescription. Patient has no new symptoms or new injury. Patient will have 2mg  IM dilaudid here and be discharged. Patient will not receive a  prescription due to pain contract.   I personally performed the services described in this documentation, which was scribed in my presence. The recorded information has been reviewed and is accurate.     Emilia BeckKaitlyn Kuron Docken, PA-C 06/13/14 2202  Gerhard Munchobert Lockwood, MD 06/13/14 [phone]

## 2014-06-13 NOTE — ED Notes (Signed)
Bed: WLPT1 Expected date:  Expected time:  Means of arrival:  Comments: 42M R arm pain Old spinal injury causes pain

## 2014-06-18 ENCOUNTER — Telehealth: Payer: Self-pay | Admitting: *Deleted

## 2014-06-18 NOTE — Telephone Encounter (Signed)
[facility]  Pt is requesting to get Rx for pain medication fill early. January 17 th is a Sunday and the pharmacy is closed.  It is ok for the pharmacy to fill medication on Saturday 06/20/2014.  Please call [phone]. Clovis PuMartin, Tamika L, RN

## 2014-06-19 NOTE — Telephone Encounter (Signed)
Verbal order given to Shanda BumpsJessica okay to fill pain medication a day early per Dr. Caleb PoppNettey. Clovis PuMartin, Gael Londo L, RN

## 2014-06-19 NOTE — Telephone Encounter (Signed)
This is an OKAY request.

## 2014-06-24 ENCOUNTER — Emergency Department (HOSPITAL_COMMUNITY): Payer: Self-pay

## 2014-06-24 ENCOUNTER — Emergency Department (HOSPITAL_COMMUNITY)
Admission: EM | Admit: 2014-06-24 | Discharge: 2014-06-24 | Disposition: A | Discharge status: Home / Self Care | Payer: Self-pay | Attending: Emergency Medicine | Admitting: Emergency Medicine

## 2014-06-24 ENCOUNTER — Encounter (HOSPITAL_COMMUNITY): Payer: Self-pay | Admitting: Physical Medicine and Rehabilitation

## 2014-06-24 DIAGNOSIS — Z87891 Personal history of nicotine dependence: Secondary | ICD-10-CM | POA: Insufficient documentation

## 2014-06-24 DIAGNOSIS — S9031XA Contusion of right foot, initial encounter: Secondary | ICD-10-CM | POA: Insufficient documentation

## 2014-06-24 DIAGNOSIS — W230XXA Caught, crushed, jammed, or pinched between moving objects, initial encounter: Secondary | ICD-10-CM | POA: Insufficient documentation

## 2014-06-24 DIAGNOSIS — Y9389 Activity, other specified: Secondary | ICD-10-CM | POA: Insufficient documentation

## 2014-06-24 DIAGNOSIS — Z79899 Other long term (current) drug therapy: Secondary | ICD-10-CM | POA: Insufficient documentation

## 2014-06-24 DIAGNOSIS — Z8739 Personal history of other diseases of the musculoskeletal system and connective tissue: Secondary | ICD-10-CM | POA: Insufficient documentation

## 2014-06-24 DIAGNOSIS — R52 Pain, unspecified: Secondary | ICD-10-CM

## 2014-06-24 DIAGNOSIS — Y998 Other external cause status: Secondary | ICD-10-CM | POA: Insufficient documentation

## 2014-06-24 DIAGNOSIS — Y9289 Other specified places as the place of occurrence of the external cause: Secondary | ICD-10-CM | POA: Insufficient documentation

## 2014-06-24 DIAGNOSIS — Z792 Long term (current) use of antibiotics: Secondary | ICD-10-CM | POA: Insufficient documentation

## 2014-06-24 MED ORDER — NAPROXEN 500 MG PO TABS: 500.0000 mg | ORAL_TABLET | Freq: Two times a day (BID) | ORAL | Status: DC

## 2014-06-24 MED ORDER — NAPROXEN 250 MG PO TABS: 500.0000 mg | ORAL_TABLET | Freq: Once | ORAL | Status: DC
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
## 2014-06-24 NOTE — Discharge Instructions (Signed)
Please follow the directions provided. Be sure to follow-up with orthopedic doctor if your pain does not improve. Wear the wooden shoe for comfort and support. Take the anti-inflammatory medicine, naproxen, to help with inflammation and pain. There are no broken bones in your foot. Don't hesitate to return for any new, worsening, or concerning symptoms.  SEEK IMMEDIATE MEDICAL CARE IF:  You have increased redness, swelling, or pain in your foot.  Your swelling or pain is not relieved with medicines.  You have loss of feeling in your foot or are unable to move your toes.  Your foot turns cold or blue.  You have pain when you move your toes.  Your foot becomes warm to the touch.  Your contusion does not improve in 2 days

## 2014-06-24 NOTE — ED Notes (Addendum)
PT reports all 5 toes hurt after shutting the door on his foot. Pt reports he takes 30mg  oxycontin for chronic pain three times a day.

## 2014-06-25 ENCOUNTER — Telehealth: Payer: Self-pay | Admitting: Family Medicine
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
## 2014-06-25 NOTE — Telephone Encounter (Signed)
Pt is calling back and wants to know when the doctor is going to call him to pick up his new prescriptions. jw
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
## 2014-06-25 NOTE — Telephone Encounter (Signed)
Opened in error. See telephone encounter for original chart.
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
## 2014-06-26 ENCOUNTER — Telehealth: Payer: Self-pay | Admitting: Family Medicine

## 2014-06-26 ENCOUNTER — Emergency Department (HOSPITAL_COMMUNITY)
Admission: EM | Admit: 2014-06-26 | Discharge: 2014-06-26 | Disposition: A | Discharge status: Home / Self Care | Payer: Self-pay | Attending: Emergency Medicine | Admitting: Emergency Medicine

## 2014-06-26 ENCOUNTER — Encounter (HOSPITAL_COMMUNITY): Payer: Self-pay

## 2014-06-26 DIAGNOSIS — R110 Nausea: Secondary | ICD-10-CM | POA: Insufficient documentation

## 2014-06-26 DIAGNOSIS — R42 Dizziness and giddiness: Secondary | ICD-10-CM | POA: Insufficient documentation

## 2014-06-26 DIAGNOSIS — Z87828 Personal history of other (healed) physical injury and trauma: Secondary | ICD-10-CM | POA: Insufficient documentation

## 2014-06-26 DIAGNOSIS — R197 Diarrhea, unspecified: Secondary | ICD-10-CM | POA: Insufficient documentation

## 2014-06-26 DIAGNOSIS — Z87891 Personal history of nicotine dependence: Secondary | ICD-10-CM | POA: Insufficient documentation

## 2014-06-26 DIAGNOSIS — Z760 Encounter for issue of repeat prescription: Secondary | ICD-10-CM

## 2014-06-26 DIAGNOSIS — M2550 Pain in unspecified joint: Secondary | ICD-10-CM | POA: Insufficient documentation

## 2014-06-26 DIAGNOSIS — Z79899 Other long term (current) drug therapy: Secondary | ICD-10-CM | POA: Insufficient documentation

## 2014-06-26 MED ORDER — OXYCODONE-ACETAMINOPHEN 5-325 MG PO TABS
1.0000 | ORAL_TABLET | Freq: Once | ORAL | Status: AC
  Administered 2014-06-26: 1 via ORAL
  Filled 2014-06-26: qty 1

## 2014-06-26 NOTE — Telephone Encounter (Signed)
Emergency Line / After Hours Call  Pt called emergency line again about his pain medication. He is not feeling well and wants to know when he can get another script. I advised that we do NOT provide narcotic rx over the phone and that because our clinic is closed today he will need to go to the ER to be evaluated for his pain and/or for withdrawal. At his request I did schedule him an appointment with Dr. Jarvis NewcomerGrunz for Monday at 1:30pm. He asked if I can send a note to the ER giving them permission to give him enough meds through Monday. I advised I cannot give permission via a note, as I am not sure which provider he will see, but that I can do this if they page me to ask. I will plan to inform them that we will NOT find pt in violation of his controlled substance agreement if he gets an rx from the ER, but that it will be up to the ER provider as to whether it is clinically appropriate to refill his medication. Discussed with pt's PCP Dr. Caleb PoppNettey who is in agreement with this plan.  Levert FeinsteinBrittany Aaden Buckman, MD Family Medicine PGY-3
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
## 2014-06-26 NOTE — Discharge Instructions (Signed)
Medication Refill, Emergency Department You have been given a dose of pain mediation in ER.  It is best for your medical care, however, to take care of getting refills done through your primary caregiver's office. They have your records and can do a better job of follow-up than we can in the emergency department. In the future, please plan for refills so that you will not have to use the emergency department for this. Thank you for your help. Your help allows us to better take care of the daily emergencies that enter our department. Document Released: 09/08/2003 Document Revised: 08/14/2011 Document Reviewed: 08/29/2013 [facility] Patient Information 2015 [employer] [place]. This information is not intended to replace advice given to you by your health care provider. Make sure you discuss any questions you have with your health care provider.

## 2014-06-29 ENCOUNTER — Ambulatory Visit (INDEPENDENT_AMBULATORY_CARE_PROVIDER_SITE_OTHER): Payer: Self-pay | Admitting: Family Medicine

## 2014-06-29 ENCOUNTER — Encounter: Payer: Self-pay | Admitting: Family Medicine

## 2014-06-29 VITALS — BP 138/91 | HR 113 | Temp 98.2°F | Ht 71.0 in | Wt 208.5 lb

## 2014-06-29 DIAGNOSIS — Z7189 Other specified counseling: Secondary | ICD-10-CM

## 2014-06-29 DIAGNOSIS — G894 Chronic pain syndrome: Secondary | ICD-10-CM

## 2014-06-29 DIAGNOSIS — G8929 Other chronic pain: Secondary | ICD-10-CM

## 2014-06-29 MED ORDER — OXYCODONE HCL 30 MG PO TABS: 30.0000 mg | ORAL_TABLET | Freq: Three times a day (TID) | ORAL | Status: DC | PRN
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
## 2014-06-29 NOTE — Patient Instructions (Signed)
It was good to meet you!  I have filled a 3-day supply of oxycodone for you. You may return after 3 days to pick up a prescription for a 30-day supply if the urine drug screen is negative. Future refills will be with Dr. Caleb PoppNettey.
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
## 2014-06-30 ENCOUNTER — Telehealth: Payer: Self-pay | Admitting: Family Medicine

## 2014-06-30 LAB — DRUG SCREEN, URINE
Amphetamine Screen, Ur: NEGATIVE
Barbiturate Quant, Ur: NEGATIVE
Benzodiazepines.: NEGATIVE
CREATININE, U: 71.18 mg/dL
Cocaine Metabolites: NEGATIVE
Marijuana Metabolite: NEGATIVE
Methadone: NEGATIVE
OPIATES: NEGATIVE
Phencyclidine (PCP): NEGATIVE
Propoxyphene: NEGATIVE

## 2014-06-30 NOTE — Telephone Encounter (Signed)
UDS was pan-negative supporting his claims. He has 9 pills prescribed 1/25 so should require refill 1/28. Dr. Caleb PoppNettey will have returned by then so I will leave further action to his discretion.   Note: The provisional plan discussed with preceptor was to prescribe 30 day supply if UDS negative. This was also discussed with the patient and he is in agreement.

## 2014-07-02 ENCOUNTER — Ambulatory Visit (INDEPENDENT_AMBULATORY_CARE_PROVIDER_SITE_OTHER): Payer: Self-pay | Admitting: Family Medicine

## 2014-07-02 ENCOUNTER — Encounter: Payer: Self-pay | Admitting: Family Medicine

## 2014-07-02 VITALS — BP 142/74 | HR 118 | Temp 98.2°F | Ht 71.0 in | Wt 203.6 lb

## 2014-07-02 DIAGNOSIS — G894 Chronic pain syndrome: Secondary | ICD-10-CM

## 2014-07-02 MED ORDER — CLONIDINE HCL 0.1 MG PO TABS: 0.1000 mg | ORAL_TABLET | Freq: Two times a day (BID) | ORAL | Status: DC

## 2014-07-02 MED ORDER — MELOXICAM 15 MG PO TABS
15.0000 mg | ORAL_TABLET | Freq: Every day | ORAL | Status: DC
Start: 2014-07-02 — End: 2014-11-13
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
## 2014-07-02 NOTE — Patient Instructions (Signed)
It is important to take the clonidine 3 times per day for your withdrawal symptoms.  Take mobic and amitriptyline to help the pain as well. You have a follow up appointment with Dr. Caleb PoppNettey scheduled next week.

## 2014-07-09 ENCOUNTER — Encounter: Payer: Self-pay | Admitting: Family Medicine

## 2014-07-09 ENCOUNTER — Ambulatory Visit (INDEPENDENT_AMBULATORY_CARE_PROVIDER_SITE_OTHER): Payer: Self-pay | Admitting: Family Medicine

## 2014-07-09 VITALS — BP 141/83 | HR 109 | Temp 98.1°F | Ht 71.0 in | Wt 199.0 lb

## 2014-07-09 DIAGNOSIS — G894 Chronic pain syndrome: Secondary | ICD-10-CM

## 2014-07-09 MED ORDER — OXYCODONE HCL 20 MG PO TABS: 20.0000 mg | ORAL_TABLET | Freq: Three times a day (TID) | ORAL | Status: AC | PRN

## 2014-07-09 MED ORDER — OXYCODONE HCL 15 MG PO TABS
15.0000 mg | ORAL_TABLET | Freq: Three times a day (TID) | ORAL | Status: DC | PRN
Start: 2014-07-09 — End: 2014-07-09

## 2014-07-09 NOTE — Patient Instructions (Addendum)
Thank you for coming to see me today. It was a pleasure. Today we talked about:   Toe pain: I do not see anything that worries me. I do notice some toe fungus, but if this is not worrying you, there is no harm in letting it be. Please use ice and wear a good insole to help with pain  Chronic pain: I am prescribing Oxycodone 20mg  three times per day and giving you a month supply. I am also going to refer you to the pain clinic  Please make an appointment to see me in 3-6 months for follow-up.  If you have any questions or concerns, please do not hesitate to call the office at [phone].  Sincerely,  Jacquelin Hawkingalph Aurore Redinger, MD

## 2014-07-09 NOTE — Progress Notes (Signed)
    Subjective    Jeffery Baldwin is a 24 y.o. male that presents for an office visit.   1. Toe injury: Injury occurred three weeks ago. He was evaluated at the ED and was found to have no fracture. Says sometimes he hears a pop. No fever. No swelling. No erythema. He is able to ambulate  2. Chronic pain: Patient states he has been without pain medication for over one week  History  Substance Use Topics  . Smoking status: Current Some Day Smoker -- 0.20 packs/day for 7 years    Types: Cigarettes  . Smokeless tobacco: Never Used     Comment: decreased smoking  . Alcohol Use: 0.0 oz/week    0 Not specified per week     Comment: occasional    Allergies  Allergen Reactions  . Morphine And Related Other (See Comments)    Emesis  . Ms Contin Rivka Safer[Morphine Sulfate] Nausea And Vomiting    No orders of the defined types were placed in this encounter.    ROS  Per HPI   Objective   BP 141/83 mmHg  Pulse 109  Temp(Src) 98.1 F (36.7 C) (Oral)  Ht 5\' 11"  (1.803 m)  Wt 199 lb (90.266 kg)  BMI 27.77 kg/m2  General: Well appearing, no distress Musculoskeletal: Right toe without deformity. No erythema. Mild tenderness at 1st MTP joint   Assessment and Plan    #Toe injury: No fracture on initial x-ray. No physical findings to suggest worsening  Repeat x-ray if pain fails to improve within a few weeks  Recommended change in shoe soles   Please refer to problem based charting of assessment and plan of chronic pain

## 2014-07-12 ENCOUNTER — Emergency Department (HOSPITAL_COMMUNITY)
Admission: EM | Admit: 2014-07-12 | Discharge: 2014-07-12 | Disposition: A | Discharge status: Home / Self Care | Payer: Self-pay | Attending: Emergency Medicine | Admitting: Emergency Medicine

## 2014-07-12 ENCOUNTER — Encounter (HOSPITAL_COMMUNITY): Payer: Self-pay | Admitting: *Deleted

## 2014-07-12 DIAGNOSIS — Z720 Tobacco use: Secondary | ICD-10-CM | POA: Insufficient documentation

## 2014-07-12 DIAGNOSIS — Z87828 Personal history of other (healed) physical injury and trauma: Secondary | ICD-10-CM | POA: Insufficient documentation

## 2014-07-12 DIAGNOSIS — Z8739 Personal history of other diseases of the musculoskeletal system and connective tissue: Secondary | ICD-10-CM | POA: Insufficient documentation

## 2014-07-12 DIAGNOSIS — Z79899 Other long term (current) drug therapy: Secondary | ICD-10-CM | POA: Insufficient documentation

## 2014-07-12 DIAGNOSIS — Z791 Long term (current) use of non-steroidal anti-inflammatories (NSAID): Secondary | ICD-10-CM | POA: Insufficient documentation

## 2014-07-12 DIAGNOSIS — F1110 Opioid abuse, uncomplicated: Secondary | ICD-10-CM | POA: Insufficient documentation

## 2014-07-12 DIAGNOSIS — F419 Anxiety disorder, unspecified: Secondary | ICD-10-CM | POA: Insufficient documentation

## 2014-07-12 MED ORDER — DICYCLOMINE HCL 20 MG PO TABS: 20.0000 mg | ORAL_TABLET | Freq: Two times a day (BID) | ORAL | Status: DC

## 2014-07-12 MED ORDER — ONDANSETRON HCL 4 MG PO TABS: 4.0000 mg | ORAL_TABLET | Freq: Four times a day (QID) | ORAL | Status: DC

## 2014-07-12 NOTE — ED Provider Notes (Signed)
CSN: [id number]     Arrival date & time 07/12/14  1724 History  This chart was scribed for a non-physician practitioner, Harle BattiestElizabeth Minervia Osso, NP-C working with Rolan BuccoMelanie Belfi, MD by [place]azil[place] Peace, ED Scribe. The patient was seen in WTR4/WLPT4. The patient's care was started at 5:47 PM.    Chief Complaint  Patient presents with  . heroine detox     The history is provided by the patient. No language interpreter was used.    HPI Comments: Jeffery Baldwin is a 24 y.o. male who presents to the Emergency Department seeking heroin detox. Pt states he has been using heroin for about 6 months (every other day), last use within past 24 hrs. He adds that he is an occasional drinker (once a week), last drink was a week ago. Pt is also current everyday smoker (0.5 packs/day). Pt notes that he also takes 90 mg of Oxycodone a day. He denies any recreational drug use such as marijuana. He reports only occasional xanax use, last dose was 1 mg  appr 1 week ago.  The last dose prior to that was appr 1 month ago.   Past Medical History  Diagnosis Date  . Spinal cord injury at C5-C7 level without injury of spinal bone 2011  . Nerve root avulsion    Past Surgical History  Procedure Laterality Date  . Nerve root repair     History reviewed. No pertinent family history. History  Substance Use Topics  . Smoking status: Current Some Day Smoker -- 0.20 packs/day for 7 years    Types: Cigarettes  . Smokeless tobacco: Never Used     Comment: decreased smoking  . Alcohol Use: 0.0 oz/week    0 Not specified per week     Comment: occasional    Review of Systems  Gastrointestinal: Negative for nausea and abdominal pain.  Neurological: Negative for headaches.  Psychiatric/Behavioral: Negative for suicidal ideas. The patient is nervous/anxious.     Allergies  Morphine and related and Ms contin  Home Medications   Prior to Admission medications   Medication Sig Start Date End Date Taking? Authorizing  Provider  amitriptyline (ELAVIL) 50 MG tablet Take 1 tablet (50 mg total) by mouth at bedtime. 06/10/14   Jacquelin Hawkingalph Nettey, MD  Calcium Carb-Cholecalciferol (CALCIUM PLUS VITAMIN D3) 600-500 MG-UNIT CAPS Take 1 capsule by mouth daily. 04/10/14   Jacquelin Hawkingalph Nettey, MD  cloNIDine (CATAPRES) 0.1 MG tablet Take 1 tablet (0.1 mg total) by mouth 2 (two) times daily. 07/02/14   Tyrone Nineyan B Grunz, MD  FLUoxetine (PROZAC) 10 MG capsule Take 10 mg by mouth daily.    Historical Provider, MD  ibuprofen (ADVIL) 200 MG tablet Take 3 tablets (600 mg total) by mouth every 6 (six) hours as needed for mild pain or moderate pain. 12/29/13   Jacquelin Hawkingalph Nettey, MD  meloxicam (MOBIC) 15 MG tablet Take 1 tablet (15 mg total) by mouth daily. 07/02/14   Tyrone Nineyan B Grunz, MD  oxyCODONE 20 MG TABS Take 1 tablet (20 mg total) by mouth every 8 (eight) hours as needed for pain. 07/09/14 08/08/14  Jacquelin Hawkingalph Nettey, MD   BP 132/87 mmHg  Pulse 92  Temp(Src) 98.4 F (36.9 C) (Oral)  Resp 20  SpO2 100% Physical Exam  Constitutional: He is oriented to person, place, and time. He appears well-developed and well-nourished. No distress.  HENT:  Head: Normocephalic and atraumatic.  Eyes: Conjunctivae and EOM are normal. Right eye exhibits no discharge. Left eye exhibits no discharge. No scleral  icterus.  Neck: Neck supple. No tracheal deviation present.  Cardiovascular: Normal rate, regular rhythm and intact distal pulses.   Pulmonary/Chest: Effort normal and breath sounds normal. No respiratory distress.  Abdominal: Soft.  Musculoskeletal: Normal range of motion.  Neurological: He is alert and oriented to person, place, and time. Coordination normal.  Skin: Skin is warm and dry. He is not diaphoretic.  Psychiatric: He has a normal mood and affect. His behavior is normal. He expresses no homicidal and no suicidal ideation.  Nursing note and vitals reviewed.   ED Course  Procedures (including critical care time) Labs Review Labs Reviewed - No data to  display  Imaging Review No results found.   EKG Interpretation None      Medications - No data to display  5:53 PM- Treatment plan was discussed with patient who verbalizes understanding and agrees.   MDM   Final diagnoses:  Heroin abuse   24 yo male presenting with report of heroin abuse and requesting detox.  He also uses opioid pain meds that have been prescribed to him.  He denies alcohol abuse or benzodiazepine abuse.  His last use of either was a week ago.  He denies any current complaints or thought of SI/HI.  Discussed outpt treatment for heroin abuse and prescriptions to help control symptoms. He is well-appearing and in no acute distress. His vital signs have been reviewed and are not concerning. Pt is aware of plan and in agreement.     I personally performed the services described in this documentation, which was scribed in my presence. The recorded information has been reviewed and is accurate.   Filed Vitals:   07/12/14 1730  BP: 132/87  Pulse: 92  Temp: 98.4 F (36.9 C)  TempSrc: Oral  Resp: 20  SpO2: 100%   Meds given in ED:  Medications - No data to display  Discharge Medication List as of 07/12/2014  6:18 PM     07/12/14 0000  dicyclomine (BENTYL) 20 MG tablet 2 times daily Discontinue Reprint 07/12/14 1817   07/12/14 0000  ondansetron (ZOFRAN) 4 MG tablet Every 6 hours Discontinue Reprint 07/12/14 1817         Harle Battiest, NP 07/12/14 1610  Rolan Bucco, MD 07/12/14 1949

## 2014-07-12 NOTE — Discharge Instructions (Signed)
Please follow the directions provided. Use the resources guide below to find an outpatient drug treatment program to treat your heroin addiction. You may use the prescription provided to help with your symptoms. The Bentyl will be helpful for abdominal cramping. The Zofran will be helpful for nausea and vomiting. You may take Tylenol or ibuprofen to help with general pain. Don't hesitate to return for any new, worsening, or concerning symptoms. SEEK MEDICAL CARE IF:  You are not able to take your medicines as directed.  Your symptoms get worse.  You relapse. SEEK IMMEDIATE MEDICAL CARE IF:  You have serious thoughts about hurting yourself or others.  You have a seizure.  You lose consciousness.   Emergency Department Resource Guide 1) Find a Doctor and Pay Out of Pocket Although you won't have to find out who is covered by your insurance plan, it is a good idea to ask around and get recommendations. You will then need to call the office and see if the doctor you have chosen will accept you as a new patient and what types of options they offer for patients who are self-pay. Some doctors offer discounts or will set up payment plans for their patients who do not have insurance, but you will need to ask so you aren't surprised when you get to your appointment.  2) Contact Your Local Health Department Not all health departments have doctors that can see patients for sick visits, but many do, so it is worth a call to see if yours does. If you don't know where your local health department is, you can check in your phone book. The CDC also has a tool to help you locate your state's health department, and many state websites also have listings of all of their local health departments.  3) Find a Walk-in Clinic If your illness is not likely to be very severe or complicated, you may want to try a walk in clinic. These are popping up all over the country in pharmacies, drugstores, and shopping centers.  They're usually staffed by nurse practitioners or physician assistants that have been trained to treat common illnesses and complaints. They're usually fairly quick and inexpensive. However, if you have serious medical issues or chronic medical problems, these are probably not your best option.  No Primary Care Doctor: - Call Health Connect at  [phone] - they can help you locate a primary care doctor that  accepts your insurance, provides certain services, etc. - Physician Referral Service- [phone]  Behavioral Health Resources in the Community: Intensive Outpatient Programs Organization         Address  Phone  Notes  [facility] [address], [place], [place] [phone]   [facility] Outpatient [address], [place], [place] [phone]   ADS: Alcohol & Drug Svcs [address], [place], [place]  [phone]   [facility] [address],  [place], [place] [phone] or [phone]   Substance Abuse Resources Organization         Address  Phone  Notes  Al[facility] Drug Services  [phone]   [facility]  [phone]   [facility]  [phone]   [facility]  [phone]   Residential & Outpatient Substance Abuse Program  [phone]   Psychological Services Organization         Address  Phone  Notes  [facility]  [phone]- [phone]   [facility]  [phone]- [phone]   [place]  [facility] [address], [place] [phone] or [phone]     Chronic Pain Problems: Organization         Address  Phone   Notes  [facility]  [phone] Patients need to be referred by their primary care doctor.   Medication Assistance: Organization         Address  Phone   Notes  [facility] Medication [facility] [address] [place], [place] [address] [phone] --Must be a resident of [facility] -- Must have NO  insurance coverage whatsoever (no Medicaid/ Medicare, etc.) -- The pt. MUST have a primary care doctor that directs their care regularly and follows them in the community   MedAssist  [phone]   [employer]  [phone]    Agencies that provide inexpensive medical care: Organization         Address  Phone   Notes  [name]  [phone]   [name]    [phone]   [facility] [address] [place], [place] [address] [phone]   [facility] [address], [place] [phone]   Planned Parenthood    [phone]   [facility]    [phone]   Community [facility] and [facility]  [address], [place] Phone:  [phone], Fax:  [phone] Hours of Operation:  9 am - 6 pm, M-F.  Also accepts Medicaid/Medicare and self-pay.  [facility]  [address], [place] Phone: [phone], Fax: [phone]. Hours of Operation:  8:30 am - 5:30 pm, M-F.  Also accepts Medicaid and self-pay.  [facility] [place] [address], [place] Phone: [phone]   [facility] [address] Natasha Bence [place], [place] [phone], Ext. [phone] Mondays & Thursdays: 7-9 AM.  First 15 patients are seen on a first come, first serve basis.    Medicaid-accepting [facility] Providers:  Organization         Address  Phone   Notes  [facility] [address], [place] [phone] Also accepts self-pay patients.  [facility] [address] Laurell Josephs [place], [place]  [phone]   [facility] [address], [place] [phone]   [facility] [address], [place] [phone]   Renaye Rakers [address], [place]   [phone] Only accepts [place] Access [place] patients after they have  their name applied to their card.   Self-Pay (no insurance) in [facility]:  Organization         Address  Phone   Notes  Sickle Cell Patients, [facility] [address] [place], [place] [phone]   [facility] [address] [place], [place] [phone]   [name] [place]  [address], [place] [phone]   [facility]/Dr. Osei-Bonsu  [address] [place] or [address], [place] [phone] Phone number for both [place] and  locations is the same.  Urgent Medical and [facility] [address], [place] [phone]   [facility] [address], [place] or [address] [phone]  [facility] [place] [phone], phone; [phone], fax Sees patients 1st and 3rd Saturday of every month.  Must not qualify for public or private insurance (i.e. Medicaid, Medicare, [place] Health Choice, Veterans' Benefits)  Household income should be no more than 200% of the poverty level The clinic cannot treat you if you are pregnant or think you are pregnant  Sexually transmitted diseases are not treated at the clinic.    Dental Care: Organization         Address  Phone  Notes  [facility] Department of [facility] [place] [phone] Accepts children up to age 61 who are enrolled in [place] or [place]; pregnant women with a Medicaid card; and children who have applied for Medicaid or [place] Health Choice, but were declined, whose parents can pay a reduced fee at time of service.  [facility] Department of [facility]  [address], [place] [phone] Accepts children up to age 23 who are enrolled in [place] or [place]; pregnant women with a Medicaid card; and children who have applied  for Medicaid or [place] Health Choice, but were declined, whose parents can pay a reduced fee at time of service.  [facility] Access PROGRAM  [place] [phone] Patients are seen by appointment only. Walk-ins are not accepted. [place] will see patients 66 years of age and older. Monday - Tuesday (8am-5pm) Most Wednesdays (8:30-5pm) $30 per visit, cash only  [facility] Access PRO[facility]  [address], [facility] [phone] Patients are seen by appointment only. Walk-ins are not accepted. [place] will see patients 59 years of age and older. One Wednesday Evening (Monthly: Volunteer Based).  $30 per visit, cash only  [place]  [phone] for adults; Children under age 15, call Graduate Pediatric Dentistry at [phone]. Children aged 65-14, please call [phone] to request a pediatric application.  Dental services are provided in all areas of dental care including fillings, crowns and bridges, complete and partial dentures, implants, gum treatment, root canals, and extractions. Preventive care is also provided. Treatment is provided to both adults and children. Patients are selected via a lottery and there is often a waiting list.   [facility] [address], [place]  [phone] [email or web address]   [facility] [address] [place], [place] [phone], Ext. [phone] Second and Fourth Thursday of each month, opens at 6:30 AM; Clinic ends at 9 AM.  Patients are seen on a first-come first-served basis, and a limited number are seen during each clinic.   [facility]  [address] Hillard Danker [place], [place] [phone]   Eligibility Requirements You must have lived in [place], [place], or [place] counties for at least the last three months.   You cannot be eligible for state or federal sponsored [employer], including [employer],  [place], or [employer].   You generally cannot be eligible for healthcare insurance through your employer.    How to apply: Eligibility screenings are held every Tuesday and Wednesday afternoon from 1:00 pm until 4:00 pm. You do not need an appointment for the interview!  [facility] [address] [place], [place]   [place]  [place] Department  [place]  Department  [phone]    Behavioral Health Resources in the Community: Intensive Outpatient Programs Organization         Address  Phone  Notes  [place]. [address] [place], [place] [phone]   [facility] Outpatient [address], [place], [place]   ADS: Alcohol & Drug Svcs [address], [place], [place]   [place] [address],  [place], [place] or [phone]   Substance Abuse Resources Organization         Address  Phone  Notes  Alcohol and Drug Services  [phone]   [place]  [phone]   The [place]   Chinita Pester  [phone]   Residential & Outpatient Substance Abuse Program  [phone]   Psychological Services Organization         Address  Phone  Notes  [facility]  [place]  [phone]   [place] [address] [place] or [phone]    Mobile Crisis Teams Organization         Address  Phone  Notes  [facility], Mobile Crisis Care Unit  [phone]   [facility]  [address] [place], [place]   [place] Levels [address] [place] [phone]    Self-Help/Support Groups Organization         Address  Phone             Notes  [place]. of [place] - variety of support  groups  [place] Call for more information  Narcotics Anonymous (NA), Caring Services [address], [employer] [place]  2 meetings at this location   Special educational needs teacher         Address  Phone  Notes  ASAP Residential Treatment [place],    [place]  [phone]   [facility]  [address] [place] [phone], [place], [place]   [place], [place] [phone] Admissions: 8am-3pm M-F  Incentives Substance [place] [address]    [place], [place] [phone]   [facility] [address] [place], [place], [place]   [facility] [address],  [place], [place]   [facility] Programs - Intensive Outpatient [address] Kristeen [address], [place], [place]   [facility] ([place] [place].,  [place], [place] [phone] or [phone]   [facility] Services (RTS) [address], [place], [place] Accepts Medicaid  [facility] [address],  [place] [phone] Substance Abuse/Addiction Treatment   [facility] Organization         Address  Phone  Notes  [facility]  [phone]   Domenic Schwab, PhD [address] Arlis Porta [place], [place]   [phone] or [phone]   [place] Ruth [place], [place] [phone]   [place] [address], [place], [place] [phone] Insurance/Medicaid/sponsorship through [employer] and Families [address] [place]                                    [place], [place] [phone] [place]  [place], [place] [phone]    Dr. Adele Schilder  [phone]   [facility] Dept. 1) [address] [place] 2) [place] 3)   [place] [address], [place] [phone]  [phone]   [place] [phone] or [phone] (After Hours)

## 2014-07-12 NOTE — ED Notes (Signed)
Pt denies SI/HI, AH/H. Pt requesting detox from heroine. Has been using 6 months-1 year, snorting and injecting. Last heroine use 2/6, normally uses every couple of days. Pt has a spinal cord injury from four years ago and is prescribed pain medications. Last prescription for oxycodone was on 2/4, pt reports he has not been taking his pain medication. Pt denies ETOH use. Reports shoulder pain 5/10.

## 2014-07-15 NOTE — Assessment & Plan Note (Signed)
I am weaning patient down. I am referring him to pain management for further management of his pain.  Decrease to oxycodone  20mg  TID  Pain management referral

## 2014-08-03 ENCOUNTER — Telehealth: Payer: Self-pay | Admitting: Family Medicine

## 2014-08-03 NOTE — Telephone Encounter (Signed)
[facility]  Patient called requesting appointment with PCP Dr Mal MistyNetty due to need for refill on oxycodone and cymbalta. Asked him to call first thing to appointments line to make this appointment and they could assist him. Let him know that if PCP thought cymbalta refill appropriate, he may be able to do this electronically but would defer to him, and oxycodone refill would definitely need a visit. Pt voiced understanding.  Jeffery SingletonMaria T Jeremaine Maraj, MD

## 2014-08-10 ENCOUNTER — Encounter: Payer: Self-pay | Admitting: Family Medicine

## 2014-08-10 ENCOUNTER — Ambulatory Visit (INDEPENDENT_AMBULATORY_CARE_PROVIDER_SITE_OTHER): Payer: Self-pay | Admitting: Family Medicine

## 2014-08-10 VITALS — BP 135/82 | HR 112 | Temp 98.3°F | Ht 71.0 in | Wt 197.0 lb

## 2014-08-10 DIAGNOSIS — G894 Chronic pain syndrome: Secondary | ICD-10-CM

## 2014-08-10 DIAGNOSIS — J069 Acute upper respiratory infection, unspecified: Secondary | ICD-10-CM

## 2014-08-10 MED ORDER — OXYCODONE HCL 20 MG PO TABS: ORAL_TABLET | ORAL | Status: DC

## 2014-08-10 MED ORDER — CLONIDINE HCL 0.1 MG PO TABS: 0.1000 mg | ORAL_TABLET | Freq: Two times a day (BID) | ORAL | Status: DC

## 2014-08-10 NOTE — Assessment & Plan Note (Signed)
Discussed recent ED visit where patient went for heroin detox. Patient denies ever going to the ED for such an encounter and that "it was not me." Vehemently denies using heroin. Has not been contacted from pain management yet. Discussed that since it is in the chart that he admitted to heroin usage, I will be tapering him off of narcotics per contract terms  Taper oxycodone  Clonidine for withdrawal symptoms  Tapering schedule for this month: Week #1: Take 0.5 tab in morning and 1 tab in afternoon and evening as needed (50mg  /day) Week #2: Take 0.5 tab in morning and afternoon and 1 tab in evening as needed (40mg  /day) Week #3: Take 0.5 tab three times per day as needed (30mg  /day) Week #4: Take 0.5 tab twice daily as needed (20mg  /day)

## 2014-08-10 NOTE — Progress Notes (Signed)
    Subjective   Jeffery Baldwin is a 24 y.o. male that presents for a same day visit  1. URI symptoms: Symptoms started about 1 week ago. Productive cough with green sputum. Also with nasal congestion, runny nose and sneezing, which has improved. He has body aches in his legs. He has had a tmax of 101 four days ago. No chills. He has been taking Theraflu which he's not sure has helped but has improved some symptoms. Also has been taking Benadryl and Mucinex. Sick contact includes a friend that he states had the flu. Overall symptoms are getting better. 2. Chronic pain: Patient states he would like a refill for his chronic pain. He states no recreational drug use. He is still waiting for referral to pain medication  History  Substance Use Topics  . Smoking status: Current Some Day Smoker -- 0.20 packs/day for 7 years    Types: Cigarettes  . Smokeless tobacco: Never Used     Comment: decreased smoking  . Alcohol Use: 0.0 oz/week    0 Standard drinks or equivalent per week     Comment: occasional    ROS Per HPI  Objective   BP 135/82 mmHg  Pulse 112  Temp(Src) 98.3 F (36.8 C) (Oral)  Ht 5\' 11"  (1.803 m)  Wt 197 lb (89.359 kg)  BMI 27.49 kg/m2  General: Well appearing male, no distress HEENT: TMs visible and normal, nasal mucosa normal, PERRL, conjunctiva non-injected, No sinus tenderness, oral mucosa normal, no cervical adenopathy  Assessment and Plan   URI  Conservative management with OTC medications  Return if worsens  Chronic pain  Refer to problem based charting

## 2014-08-10 NOTE — Patient Instructions (Addendum)
Thank you for coming to see me today. It was a pleasure. Today we talked about:   Upper respiratory infection: your symptoms are improving. Please keep taking the over the counter medication as needed  Chronic pain: I am tapering your narcotics. I am also placing a refill for your clonidine for withdrawal symptoms. Here is your taper schedule:  Week #1: Take 0.5 tab in morning and 1 tab in afternoon and evening as needed (50mg  /day) Week #2: Take 0.5 tab in morning and afternoon and 1 tab in evening as needed (40mg  /day) Week #3: Take 0.5 tab three times per day as needed (30mg  /day) Week #4: Take 0.5 tab twice daily as needed (20mg  /day)  Please make an appointment to see me in one month for follow-up and continued taper.  If you have any questions or concerns, please do not hesitate to call the office at [phone].  Sincerely,  Jacquelin Hawkingalph Nettey, MD

## 2014-09-22 ENCOUNTER — Ambulatory Visit: Payer: Self-pay | Admitting: Family Medicine

## 2014-11-13 ENCOUNTER — Emergency Department (HOSPITAL_COMMUNITY): Payer: Self-pay

## 2014-11-13 ENCOUNTER — Inpatient Hospital Stay (HOSPITAL_COMMUNITY)
Admission: EM | Admit: 2014-11-13 | Discharge: 2014-11-16 | DRG: 864 | Disposition: A | Discharge status: Home / Self Care | Payer: Self-pay | Attending: Family Medicine | Admitting: Family Medicine

## 2014-11-13 ENCOUNTER — Encounter (HOSPITAL_COMMUNITY): Payer: Self-pay | Admitting: Emergency Medicine

## 2014-11-13 DIAGNOSIS — R509 Fever, unspecified: Principal | ICD-10-CM | POA: Diagnosis present

## 2014-11-13 DIAGNOSIS — G8921 Chronic pain due to trauma: Secondary | ICD-10-CM | POA: Diagnosis present

## 2014-11-13 DIAGNOSIS — R079 Chest pain, unspecified: Secondary | ICD-10-CM

## 2014-11-13 DIAGNOSIS — Z886 Allergy status to analgesic agent status: Secondary | ICD-10-CM

## 2014-11-13 DIAGNOSIS — Z79891 Long term (current) use of opiate analgesic: Secondary | ICD-10-CM

## 2014-11-13 DIAGNOSIS — F1990 Other psychoactive substance use, unspecified, uncomplicated: Secondary | ICD-10-CM

## 2014-11-13 DIAGNOSIS — F17210 Nicotine dependence, cigarettes, uncomplicated: Secondary | ICD-10-CM | POA: Diagnosis present

## 2014-11-13 DIAGNOSIS — M25511 Pain in right shoulder: Secondary | ICD-10-CM | POA: Diagnosis present

## 2014-11-13 DIAGNOSIS — S14105S Unspecified injury at C5 level of cervical spinal cord, sequela: Secondary | ICD-10-CM

## 2014-11-13 DIAGNOSIS — G8321 Monoplegia of upper limb affecting right dominant side: Secondary | ICD-10-CM | POA: Insufficient documentation

## 2014-11-13 DIAGNOSIS — G8929 Other chronic pain: Secondary | ICD-10-CM

## 2014-11-13 DIAGNOSIS — F1910 Other psychoactive substance abuse, uncomplicated: Secondary | ICD-10-CM

## 2014-11-13 DIAGNOSIS — G839 Paralytic syndrome, unspecified: Secondary | ICD-10-CM | POA: Diagnosis present

## 2014-11-13 DIAGNOSIS — G894 Chronic pain syndrome: Secondary | ICD-10-CM | POA: Diagnosis present

## 2014-11-13 HISTORY — DX: Chronic pain syndrome: G89.4

## 2014-11-13 HISTORY — DX: Opioid abuse, uncomplicated: F11.10

## 2014-11-13 LAB — URINALYSIS, ROUTINE W REFLEX MICROSCOPIC
Bilirubin Urine: NEGATIVE
Glucose, UA: NEGATIVE mg/dL
Hgb urine dipstick: NEGATIVE
Ketones, ur: NEGATIVE mg/dL
Leukocytes, UA: NEGATIVE
Nitrite: NEGATIVE
Protein, ur: NEGATIVE mg/dL
Specific Gravity, Urine: 1.01 (ref 1.005–1.030)
Urobilinogen, UA: 1 mg/dL (ref 0.0–1.0)
pH: 7.5 (ref 5.0–8.0)

## 2014-11-13 LAB — HEPATIC FUNCTION PANEL
ALK PHOS: 78 U/L (ref 38–126)
ALT: 41 U/L (ref 17–63)
AST: 41 U/L (ref 15–41)
Albumin: 4.3 g/dL (ref 3.5–5.0)
BILIRUBIN INDIRECT: 0.9 mg/dL (ref 0.3–0.9)
Bilirubin, Direct: 0.3 mg/dL (ref 0.1–0.5)
TOTAL PROTEIN: 7 g/dL (ref 6.5–8.1)
Total Bilirubin: 1.2 mg/dL (ref 0.3–1.2)

## 2014-11-13 LAB — BASIC METABOLIC PANEL
ANION GAP: 11 (ref 5–15)
BUN: 7 mg/dL (ref 6–20)
CO2: 21 mmol/L — AB (ref 22–32)
CREATININE: 0.94 mg/dL (ref 0.61–1.24)
Calcium: 9.2 mg/dL (ref 8.9–10.3)
Chloride: 105 mmol/L (ref 101–111)
GFR calc Af Amer: >60 mL/min (ref >60)
GFR calc non Af Amer: >60 mL/min (ref >60)
Glucose, Bld: 120 mg/dL — ABNORMAL HIGH (ref 65–99)
POTASSIUM: 3.2 mmol/L — AB (ref 3.5–5.1)
Sodium: 137 mmol/L (ref 135–145)

## 2014-11-13 LAB — CBC
HEMATOCRIT: 40.6 % (ref 39.0–52.0)
Hemoglobin: 14.8 g/dL (ref 13.0–17.0)
MCH: 31.4 pg (ref 26.0–34.0)
MCHC: 36.5 g/dL — AB (ref 30.0–36.0)
MCV: 86 fL (ref 78.0–100.0)
Platelets: 120 10*3/uL — ABNORMAL LOW (ref 150–400)
RBC: 4.72 MIL/uL (ref 4.22–5.81)
RDW: 11.9 % (ref 11.5–15.5)
WBC: 6.1 10*3/uL (ref 4.0–10.5)

## 2014-11-13 LAB — I-STAT CG4 LACTIC ACID, ED
Lactic Acid, Venous: 0.92 mmol/L (ref 0.5–2.0)
Lactic Acid, Venous: 3.82 mmol/L (ref 0.5–2.0)

## 2014-11-13 LAB — D-DIMER, QUANTITATIVE: D-Dimer, Quant: 3.19 ug{FEU}/mL — ABNORMAL HIGH (ref 0.00–0.48)

## 2014-11-13 LAB — BRAIN NATRIURETIC PEPTIDE: B Natriuretic Peptide: 28.4 pg/mL (ref 0.0–100.0)

## 2014-11-13 LAB — LIPASE, BLOOD: Lipase: 14 U/L — ABNORMAL LOW (ref 22–51)

## 2014-11-13 LAB — I-STAT TROPONIN, ED: Troponin i, poc: 0 ng/mL (ref 0.00–0.08)

## 2014-11-13 MED ORDER — LINEZOLID 600 MG PO TABS: 600.0000 mg | ORAL_TABLET | Freq: Two times a day (BID) | ORAL | Status: DC

## 2014-11-13 MED ORDER — FENTANYL CITRATE (PF) 100 MCG/2ML IJ SOLN
50.0000 ug | INTRAMUSCULAR | Status: DC | PRN
Start: 2014-11-13 — End: 2014-11-14
  Administered 2014-11-14 (×3): 50 ug via INTRAVENOUS
  Administered 2014-11-14: 25 ug via INTRAVENOUS
  Filled 2014-11-13 (×4): qty 2

## 2014-11-13 MED ORDER — VANCOMYCIN HCL IN DEXTROSE 1-5 GM/200ML-% IV SOLN
1000.0000 mg | Freq: Once | INTRAVENOUS | Status: AC
  Administered 2014-11-13: 1000 mg via INTRAVENOUS
  Filled 2014-11-13: qty 200

## 2014-11-13 MED ORDER — ONDANSETRON HCL 4 MG/2ML IJ SOLN
4.0000 mg | Freq: Once | INTRAMUSCULAR | Status: AC
  Administered 2014-11-13: 4 mg via INTRAVENOUS
  Filled 2014-11-13: qty 2

## 2014-11-13 MED ORDER — FENTANYL CITRATE (PF) 100 MCG/2ML IJ SOLN
50.0000 ug | Freq: Once | INTRAMUSCULAR | Status: AC
  Administered 2014-11-13: 50 ug via INTRAVENOUS
  Filled 2014-11-13: qty 2

## 2014-11-13 MED ORDER — SODIUM CHLORIDE 0.9 % IV BOLUS (SEPSIS)
1000.0000 mL | Freq: Once | INTRAVENOUS | Status: AC
  Administered 2014-11-13: 1000 mL via INTRAVENOUS

## 2014-11-13 MED ORDER — IOHEXOL 350 MG/ML SOLN
75.0000 mL | Freq: Once | INTRAVENOUS | Status: AC | PRN
  Administered 2014-11-13: 75 mL via INTRAVENOUS

## 2014-11-13 MED ORDER — PIPERACILLIN-TAZOBACTAM 3.375 G IVPB
3.3750 g | Freq: Once | INTRAVENOUS | Status: AC
  Administered 2014-11-13: 3.375 g via INTRAVENOUS
  Filled 2014-11-13: qty 50

## 2014-11-13 MED ORDER — SODIUM CHLORIDE 0.9 % IV BOLUS (SEPSIS)
1000.0000 mL | INTRAVENOUS | Status: AC
  Administered 2014-11-13 (×3): 1000 mL via INTRAVENOUS

## 2014-11-13 MED ORDER — KETOROLAC TROMETHAMINE 30 MG/ML IJ SOLN
30.0000 mg | Freq: Once | INTRAMUSCULAR | Status: AC
  Administered 2014-11-13: 30 mg via INTRAVENOUS
  Filled 2014-11-13: qty 1

## 2014-11-13 MED ORDER — VANCOMYCIN HCL IN DEXTROSE 1-5 GM/200ML-% IV SOLN
1000.0000 mg | Freq: Once | INTRAVENOUS | Status: DC
  Filled 2014-11-13: qty 200

## 2014-11-13 NOTE — ED Notes (Signed)
Transporting patient to new room assignment. 
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
## 2014-11-13 NOTE — ED Notes (Signed)
Pt. reports central chest pain with mild SOB and nausea onset today  , denies emesis or diaphoresis , pain increases with deep inspiration .
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
## 2014-11-14 DIAGNOSIS — F1990 Other psychoactive substance use, unspecified, uncomplicated: Secondary | ICD-10-CM | POA: Insufficient documentation

## 2014-11-14 DIAGNOSIS — R079 Chest pain, unspecified: Secondary | ICD-10-CM | POA: Insufficient documentation

## 2014-11-14 LAB — BASIC METABOLIC PANEL
Anion gap: 9 (ref 5–15)
BUN: 5 mg/dL — ABNORMAL LOW (ref 6–20)
CO2: 21 mmol/L — AB (ref 22–32)
CREATININE: 0.86 mg/dL (ref 0.61–1.24)
Calcium: 8.1 mg/dL — ABNORMAL LOW (ref 8.9–10.3)
Chloride: 110 mmol/L (ref 101–111)
GFR calc Af Amer: >60 mL/min (ref >60)
GFR calc non Af Amer: >60 mL/min (ref >60)
GLUCOSE: 127 mg/dL — AB (ref 65–99)
Potassium: 3.7 mmol/L (ref 3.5–5.1)
Sodium: 140 mmol/L (ref 135–145)

## 2014-11-14 LAB — GLUCOSE, CAPILLARY: Glucose-Capillary: 104 mg/dL — ABNORMAL HIGH (ref 65–99)

## 2014-11-14 LAB — RAPID URINE DRUG SCREEN, HOSP PERFORMED
AMPHETAMINES: NOT DETECTED
Barbiturates: NOT DETECTED
Benzodiazepines: NOT DETECTED
COCAINE: NOT DETECTED
OPIATES: POSITIVE — AB
Tetrahydrocannabinol: NOT DETECTED

## 2014-11-14 MED ORDER — POTASSIUM CHLORIDE CRYS ER 20 MEQ PO TBCR
20.0000 meq | EXTENDED_RELEASE_TABLET | Freq: Two times a day (BID) | ORAL | Status: DC
  Administered 2014-11-14 – 2014-11-16 (×6): 20 meq via ORAL
  Filled 2014-11-14 (×6): qty 1

## 2014-11-14 MED ORDER — PIPERACILLIN-TAZOBACTAM 3.375 G IVPB
3.3750 g | Freq: Three times a day (TID) | INTRAVENOUS | Status: AC
  Administered 2014-11-14 – 2014-11-15 (×5): 3.375 g via INTRAVENOUS
  Filled 2014-11-14 (×6): qty 50

## 2014-11-14 MED ORDER — IPRATROPIUM BROMIDE 0.02 % IN SOLN
0.5000 mg | Freq: Four times a day (QID) | RESPIRATORY_TRACT | Status: DC
  Administered 2014-11-14: 0.5 mg via RESPIRATORY_TRACT
  Filled 2014-11-14: qty 2.5

## 2014-11-14 MED ORDER — ONDANSETRON HCL 4 MG PO TABS: 4.0000 mg | ORAL_TABLET | Freq: Four times a day (QID) | ORAL | Status: DC | PRN

## 2014-11-14 MED ORDER — KETOROLAC TROMETHAMINE 30 MG/ML IJ SOLN
30.0000 mg | Freq: Four times a day (QID) | INTRAMUSCULAR | Status: DC
  Administered 2014-11-14 – 2014-11-16 (×7): 30 mg via INTRAVENOUS
  Filled 2014-11-14 (×13): qty 1

## 2014-11-14 MED ORDER — ACETAMINOPHEN 325 MG PO TABS: 650.0000 mg | ORAL_TABLET | Freq: Four times a day (QID) | ORAL | Status: DC | PRN

## 2014-11-14 MED ORDER — ZOLPIDEM TARTRATE 5 MG PO TABS
5.0000 mg | ORAL_TABLET | Freq: Every evening | ORAL | Status: DC | PRN
  Administered 2014-11-14: 5 mg via ORAL
  Filled 2014-11-14: qty 1

## 2014-11-14 MED ORDER — ACETAMINOPHEN 650 MG RE SUPP: 650.0000 mg | Freq: Four times a day (QID) | RECTAL | Status: DC | PRN

## 2014-11-14 MED ORDER — SODIUM CHLORIDE 0.9 % IV SOLN
INTRAVENOUS | Status: DC
  Administered 2014-11-14: 75 mL via INTRAVENOUS

## 2014-11-14 MED ORDER — OXYCODONE HCL 5 MG PO TABS
5.0000 mg | ORAL_TABLET | ORAL | Status: DC | PRN
  Administered 2014-11-14 – 2014-11-15 (×7): 10 mg via ORAL
  Administered 2014-11-16: 5 mg via ORAL
  Administered 2014-11-16: 10 mg via ORAL
  Administered 2014-11-16: 5 mg via ORAL
  Administered 2014-11-16: 10 mg via ORAL
  Filled 2014-11-14 (×2): qty 2
  Filled 2014-11-14 (×2): qty 1
  Filled 2014-11-14 (×7): qty 2

## 2014-11-14 MED ORDER — ONDANSETRON HCL 4 MG/2ML IJ SOLN: 4.0000 mg | Freq: Four times a day (QID) | INTRAMUSCULAR | Status: DC | PRN

## 2014-11-14 MED ORDER — AMITRIPTYLINE HCL 25 MG PO TABS
25.0000 mg | ORAL_TABLET | Freq: Every day | ORAL | Status: DC
  Administered 2014-11-14 – 2014-11-15 (×2): 25 mg via ORAL
  Filled 2014-11-14 (×3): qty 1

## 2014-11-14 MED ORDER — POLYETHYLENE GLYCOL 3350 17 G PO PACK
17.0000 g | PACK | Freq: Every day | ORAL | Status: DC | PRN
  Filled 2014-11-14: qty 1

## 2014-11-14 MED ORDER — VANCOMYCIN HCL 10 G IV SOLR
1250.0000 mg | Freq: Three times a day (TID) | INTRAVENOUS | Status: AC
  Administered 2014-11-14 – 2014-11-15 (×5): 1250 mg via INTRAVENOUS
  Filled 2014-11-14 (×6): qty 1250

## 2014-11-14 MED ORDER — ENOXAPARIN SODIUM 40 MG/0.4ML ~~LOC~~ SOLN
40.0000 mg | Freq: Every day | SUBCUTANEOUS | Status: DC
  Filled 2014-11-14 (×3): qty 0.4

## 2014-11-14 MED ORDER — HYDROCODONE-ACETAMINOPHEN 5-325 MG PO TABS
1.0000 | ORAL_TABLET | ORAL | Status: DC | PRN
  Administered 2014-11-14 (×2): 2 via ORAL
  Filled 2014-11-14 (×2): qty 2

## 2014-11-14 MED ORDER — SODIUM CHLORIDE 0.9 % IJ SOLN
3.0000 mL | Freq: Two times a day (BID) | INTRAMUSCULAR | Status: DC
  Administered 2014-11-14 – 2014-11-16 (×5): 3 mL via INTRAVENOUS
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
## 2014-11-14 NOTE — Progress Notes (Signed)
ANTIBIOTIC CONSULT NOTE - INITIAL  Pharmacy Consult for Vancomycin/Zosyn  Indication: rule out sepsis, r/o bacteremia/endocarditis  Allergies  Allergen Reactions  . Morphine And Related Other (See Comments)    Emesis  . Ms Contin [place] Safer Sulfate] Nausea And Vomiting    Patient Measurements: Height: 6' (182.9 cm) Weight: 199 lb 1 oz (90.294 kg) IBW/kg (Calculated) : 77.6  Vital Signs: Temp: 98.5 F (36.9 C) (06/10 2326) Temp Source: Oral (06/10 2326) BP: 111/60 mmHg (06/10 2330) Pulse Rate: 84 (06/10 2330)  Labs:  Recent Labs  11/13/14 1951  WBC 6.1  HGB 14.8  PLT 120*  CREATININE 0.94   Estimated Creatinine Clearance: 131.9 mL/min (by C-G formula based on Cr of 0.94).  Medical History: Past Medical History  Diagnosis Date  . Spinal cord injury at C5-C7 level without injury of spinal bone 2011  . Nerve root avulsion   . Chronic pain syndrome   . Heroin abuse     Assessment: 24 y/o M with hx of IVDA here with fever/pain, starting vancomycin/zosyn per pharmacy, WBC WNL, renal function good, other labs as above.   Goal of Therapy:  Vancomycin trough level 15-20 mcg/ml  Plan:  -Vancomycin 1250 mg IV q8h -Zosyn 3.375G IV q8h to be infused over 4 hours -Trend WBC, temp, renal function  -Drug levels as indicated -F/U infectious w/u  Abran Duke 11/14/2014,12:33 AM

## 2014-11-14 NOTE — Progress Notes (Signed)
Patient called RN complaining of itching after iv administration of Vancomycin.  Itching was located on scalp and left forearm. Both areas were not discolored and presenting with any "rashes".  Also, patient is requesting better pain management for his right shoulder chronic pain.  RN encouraged patient to discuss with MD for better pain management control.  Patient verbalized understanding and plans to discuss pain management with MD.  Call bell within reach, RN will continue to monitor.

## 2014-11-15 ENCOUNTER — Inpatient Hospital Stay (HOSPITAL_COMMUNITY): Payer: Self-pay

## 2014-11-15 DIAGNOSIS — R509 Fever, unspecified: Principal | ICD-10-CM

## 2014-11-15 DIAGNOSIS — F1990 Other psychoactive substance use, unspecified, uncomplicated: Secondary | ICD-10-CM

## 2014-11-15 DIAGNOSIS — R0782 Intercostal pain: Secondary | ICD-10-CM

## 2014-11-15 DIAGNOSIS — G8321 Monoplegia of upper limb affecting right dominant side: Secondary | ICD-10-CM | POA: Insufficient documentation

## 2014-11-15 LAB — BASIC METABOLIC PANEL
Anion gap: 8 (ref 5–15)
BUN: 7 mg/dL (ref 6–20)
CO2: 24 mmol/L (ref 22–32)
Calcium: 8.4 mg/dL — ABNORMAL LOW (ref 8.9–10.3)
Chloride: 106 mmol/L (ref 101–111)
Creatinine, Ser: 0.78 mg/dL (ref 0.61–1.24)
GFR calc Af Amer: >60 mL/min (ref >60)
GFR calc non Af Amer: >60 mL/min (ref >60)
Glucose, Bld: 108 mg/dL — ABNORMAL HIGH (ref 65–99)
POTASSIUM: 4 mmol/L (ref 3.5–5.1)
Sodium: 138 mmol/L (ref 135–145)

## 2014-11-15 LAB — HIV ANTIBODY (ROUTINE TESTING W REFLEX): HIV Screen 4th Generation wRfx: NONREACTIVE

## 2014-11-15 LAB — HEPATITIS PANEL, ACUTE
Hep A IgM: NEGATIVE
Hep B C IgM: NEGATIVE
Hepatitis B Surface Ag: NEGATIVE

## 2014-11-15 LAB — URINE CULTURE
COLONY COUNT: NO GROWTH
Culture: NO GROWTH

## 2014-11-15 LAB — CBC
HEMATOCRIT: 34.1 % — AB (ref 39.0–52.0)
HEMOGLOBIN: 12.1 g/dL — AB (ref 13.0–17.0)
MCH: 31.3 pg (ref 26.0–34.0)
MCHC: 35.5 g/dL (ref 30.0–36.0)
MCV: 88.1 fL (ref 78.0–100.0)
Platelets: 98 10*3/uL — ABNORMAL LOW (ref 150–400)
RBC: 3.87 MIL/uL — ABNORMAL LOW (ref 4.22–5.81)
RDW: 12.4 % (ref 11.5–15.5)
WBC: 6 10*3/uL (ref 4.0–10.5)
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
## 2014-11-15 NOTE — Progress Notes (Signed)
  Echocardiogram 2D Echocardiogram has been performed.  Jeffery Baldwin 11/15/2014, 10:54 AM
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
## 2014-11-16 LAB — BASIC METABOLIC PANEL
Anion gap: 7 (ref 5–15)
BUN: 6 mg/dL (ref 6–20)
CALCIUM: 8.6 mg/dL — AB (ref 8.9–10.3)
CHLORIDE: 107 mmol/L (ref 101–111)
CO2: 25 mmol/L (ref 22–32)
Creatinine, Ser: 0.78 mg/dL (ref 0.61–1.24)
GFR calc Af Amer: >60 mL/min (ref >60)
GFR calc non Af Amer: >60 mL/min (ref >60)
Glucose, Bld: 128 mg/dL — ABNORMAL HIGH (ref 65–99)
Potassium: 3.7 mmol/L (ref 3.5–5.1)
Sodium: 139 mmol/L (ref 135–145)

## 2014-11-16 LAB — CBC
HCT: 35.2 % — ABNORMAL LOW (ref 39.0–52.0)
Hemoglobin: 12.2 g/dL — ABNORMAL LOW (ref 13.0–17.0)
MCH: 30.4 pg (ref 26.0–34.0)
MCHC: 34.7 g/dL (ref 30.0–36.0)
MCV: 87.8 fL (ref 78.0–100.0)
PLATELETS: 104 10*3/uL — AB (ref 150–400)
RBC: 4.01 MIL/uL — AB (ref 4.22–5.81)
RDW: 12.3 % (ref 11.5–15.5)
WBC: 5.6 10*3/uL (ref 4.0–10.5)

## 2014-11-16 NOTE — Progress Notes (Signed)
Pt discharge education and instructions completed with pt and family at bedside. All voices understanding and denies any questions. Pt IV removed; pt discharge home with family to transport him home. Pt refused w/c when offered and ambulated off unit with family and belongings to the side. Arabella Merles Laelyn Blumenthal RN.

## 2014-11-16 NOTE — Progress Notes (Signed)
Utilization review completed.  

## 2014-11-16 NOTE — Discharge Instructions (Signed)
You were admitted to hospital to rule out possible blood infection. All of our tests were negative. Your blood did not grow any bacteria after >48 hours. We watch the results for up to 5 days, if you get called with positive blood culture results, you will need to return to hospital as instructed (we believe that this is HIGHLY unlikely, given negative results so far). Your ECHO heart ultrasound was normal - no signs of heart infection and normal heart function.  We believe that your pain is all related to your recent injury / trauma 1 week ago and will take time to heal. Continue your current regimen of treatment.  Please follow-up at your Hospital Follow-up apt on Monday 11/23/14 at 9:30am, and then will need to follow-up with your PCP Jeffery Baldwin as scheduled in future.  Make sure that you stay tuned and follow-up with pain management once you get appointment scheduled.  If develop new or worsening symptoms with fevers/chills/sweats, nausea / vomiting, or other signs of infection, please call or return to seek medical attention.

## 2014-11-20 LAB — CULTURE, BLOOD (ROUTINE X 2)
CULTURE: NO GROWTH
Culture: NO GROWTH

## 2014-11-23 ENCOUNTER — Inpatient Hospital Stay: Payer: Self-pay | Admitting: Family Medicine

## 2015-01-27 ENCOUNTER — Emergency Department (HOSPITAL_COMMUNITY)
Admission: EM | Admit: 2015-01-27 | Discharge: 2015-01-27 | Disposition: A | Discharge status: Home / Self Care | Payer: Self-pay | Attending: Emergency Medicine | Admitting: Emergency Medicine

## 2015-01-27 ENCOUNTER — Encounter (HOSPITAL_COMMUNITY): Payer: Self-pay | Admitting: *Deleted

## 2015-01-27 DIAGNOSIS — Z87828 Personal history of other (healed) physical injury and trauma: Secondary | ICD-10-CM | POA: Insufficient documentation

## 2015-01-27 DIAGNOSIS — Z79899 Other long term (current) drug therapy: Secondary | ICD-10-CM | POA: Insufficient documentation

## 2015-01-27 DIAGNOSIS — L237 Allergic contact dermatitis due to plants, except food: Secondary | ICD-10-CM | POA: Insufficient documentation

## 2015-01-27 DIAGNOSIS — G894 Chronic pain syndrome: Secondary | ICD-10-CM | POA: Insufficient documentation

## 2015-01-27 DIAGNOSIS — Z720 Tobacco use: Secondary | ICD-10-CM | POA: Insufficient documentation

## 2015-01-27 MED ORDER — PREDNISONE 50 MG PO TABS: 50.0000 mg | ORAL_TABLET | Freq: Every day | ORAL | Status: DC

## 2015-01-27 MED ORDER — DEXAMETHASONE SODIUM PHOSPHATE 10 MG/ML IJ SOLN
10.0000 mg | Freq: Once | INTRAMUSCULAR | Status: AC
  Administered 2015-01-27: 10 mg via INTRAVENOUS
  Filled 2015-01-27: qty 1

## 2015-01-27 MED ORDER — SODIUM CHLORIDE 0.9 % IV BOLUS (SEPSIS)
1000.0000 mL | Freq: Once | INTRAVENOUS | Status: AC
  Administered 2015-01-27: 1000 mL via INTRAVENOUS

## 2015-01-27 MED ORDER — DIPHENHYDRAMINE HCL 50 MG/ML IJ SOLN
25.0000 mg | Freq: Once | INTRAMUSCULAR | Status: AC
  Administered 2015-01-27: 25 mg via INTRAVENOUS
  Filled 2015-01-27: qty 1

## 2015-01-27 NOTE — ED Provider Notes (Signed)
CSN: [id number]     Arrival date & time 01/27/15  1143 History   First MD Initiated Contact with Patient 01/27/15 1233     Chief Complaint  Patient presents with  . Allergic Reaction     (Consider location/radiation/quality/duration/timing/severity/associated sxs/prior Treatment) HPI  Patient is a 24 yo male who presents with face swelling and rash. He woke up two days ago with a rash along his hairline, the day before he had been working outside with a friend. He took an anti-histamine which was not effective. Today he woke up with increased swelling and redness to his whole face. He denies fever, chills, night sweats, CP, SOB, dizziness, N/V.   Past Medical History  Diagnosis Date  . Spinal cord injury at C5-C7 level without injury of spinal bone 2011  . Nerve root avulsion   . Chronic pain syndrome   . Heroin abuse    Past Surgical History  Procedure Laterality Date  . Nerve root repair     History reviewed. No pertinent family history. Social History  Substance Use Topics  . Smoking status: Current Some Day Smoker -- 0.20 packs/day for 7 years    Types: Cigarettes  . Smokeless tobacco: Never Used     Comment: decreased smoking  . Alcohol Use: 0.0 oz/week    0 Standard drinks or equivalent per week     Comment: occasional    Review of Systems  All other systems negative except as documented in the HPI. All pertinent positives and negatives as reviewed in the HPI.  Allergies  Morphine and related and Ms contin  Home Medications   Prior to Admission medications   Medication Sig Start Date End Date Taking? Authorizing Provider  acetaminophen (TYLENOL) 500 MG tablet Take 1,000 mg by mouth every 6 (six) hours as needed for moderate pain.    Historical Provider, MD  amitriptyline (ELAVIL) 50 MG tablet Take 1 tablet (50 mg total) by mouth at bedtime. Patient not taking: Reported on 11/13/2014 06/10/14   Narda Bonds, MD  Aspirin-Acetaminophen-Caffeine (GOODY HEADACHE PO)  Take 1 packet by mouth daily as needed (general pain).    Historical Provider, MD  Calcium Carb-Cholecalciferol (CALCIUM PLUS VITAMIN D3) 600-500 MG-UNIT CAPS Take 1 capsule by mouth daily. 04/10/14   Narda Bonds, MD   BP 134/69 mmHg  Pulse 93  Temp(Src) 98.7 F (37.1 C) (Oral)  Resp 18  Ht 5\' 11"  (1.803 m)  Wt 190 lb (86.183 kg)  BMI 26.51 kg/m2  SpO2 99% Physical Exam  Constitutional: He is oriented to person, place, and time. He appears well-developed and well-nourished.  HENT:  Head: Normocephalic and atraumatic.  Cardiovascular: Normal rate, regular rhythm and normal heart sounds.  Exam reveals no gallop and no friction rub.   No murmur heard. Pulmonary/Chest: Effort normal and breath sounds normal. No respiratory distress.  Lymphadenopathy:    He has no cervical adenopathy.  Neurological: He is alert and oriented to person, place, and time.  Skin: Skin is warm. Rash noted. There is erythema.  Small vesicles scattered along the forehead near the hairline. Erythema and swelling throughout the entire face. Lips swelling with vesicles around the mouth.   Small clusters of vesicles scattered along the nape of the neck.     ED Course  Procedures (including critical care time) Labs Review Labs Reviewed - No data to display  Imaging Review No results found. I have personally reviewed and evaluated these images and lab results as part of my  medical decision-making.    Patient has improved here in the emergency department.  We will have him follow-up with his primary care Dr. told to use Benadryl.  Prescription prednisone    Charlestine Night, PA-C 01/27/15 1446  Melene Plan, DO 01/27/15 1902

## 2015-01-27 NOTE — ED Notes (Signed)
Pt reports waking up yesterday with rash and itching to face, pt thought it was poison ivy. Took otc meds and lotions, woke up this am with redness to face and swelling to eyes. Airway intact with no resp distress noted.

## 2015-01-27 NOTE — Discharge Instructions (Signed)
Take Benadryl as well.  Return here as needed

## 2015-08-29 ENCOUNTER — Emergency Department (HOSPITAL_COMMUNITY): Payer: Self-pay

## 2015-08-29 ENCOUNTER — Encounter (HOSPITAL_COMMUNITY): Payer: Self-pay | Admitting: *Deleted

## 2015-08-29 ENCOUNTER — Emergency Department (HOSPITAL_COMMUNITY)
Admission: EM | Admit: 2015-08-29 | Discharge: 2015-08-29 | Disposition: A | Discharge status: Home / Self Care | Payer: Self-pay | Attending: Emergency Medicine | Admitting: Emergency Medicine

## 2015-08-29 DIAGNOSIS — W01198A Fall on same level from slipping, tripping and stumbling with subsequent striking against other object, initial encounter: Secondary | ICD-10-CM | POA: Insufficient documentation

## 2015-08-29 DIAGNOSIS — Y92009 Unspecified place in unspecified non-institutional (private) residence as the place of occurrence of the external cause: Secondary | ICD-10-CM | POA: Insufficient documentation

## 2015-08-29 DIAGNOSIS — F17210 Nicotine dependence, cigarettes, uncomplicated: Secondary | ICD-10-CM | POA: Insufficient documentation

## 2015-08-29 DIAGNOSIS — S0003XA Contusion of scalp, initial encounter: Secondary | ICD-10-CM | POA: Insufficient documentation

## 2015-08-29 DIAGNOSIS — S060X0A Concussion without loss of consciousness, initial encounter: Secondary | ICD-10-CM

## 2015-08-29 DIAGNOSIS — Y998 Other external cause status: Secondary | ICD-10-CM | POA: Insufficient documentation

## 2015-08-29 DIAGNOSIS — G894 Chronic pain syndrome: Secondary | ICD-10-CM | POA: Insufficient documentation

## 2015-08-29 DIAGNOSIS — Y9389 Activity, other specified: Secondary | ICD-10-CM | POA: Insufficient documentation

## 2015-08-29 MED ORDER — HYDROMORPHONE HCL 1 MG/ML IJ SOLN
2.0000 mg | Freq: Once | INTRAMUSCULAR | Status: AC
  Administered 2015-08-29: 2 mg via INTRAMUSCULAR
  Filled 2015-08-29: qty 2

## 2015-08-29 NOTE — Discharge Instructions (Signed)
Concussion, Adult Jeffery Baldwin, your CT scan did not show any injury.  You have a concussion and need to see your primary care doctor within 3 days for close follow up.  If any symptoms worsen, come back to the ED immediately. Thank you. A concussion is a brain injury. It is caused by:  A hit to the head.  A quick and sudden movement (jolt) of the head or neck. A concussion is usually not life threatening. Even so, it can cause serious problems. If you had a concussion before, you may have concussion-like problems after a hit to your head. HOME CARE General Instructions  Follow your doctor's directions carefully.  Take medicines only as told by your doctor.  Only take medicines your doctor says are safe.  Do not drink alcohol until your doctor says it is okay. Alcohol and some drugs can slow down healing. They can also put you at risk for further injury.  If you are having trouble remembering things, write them down.  Try to do one thing at a time if you get distracted easily. For example, do not watch TV while making dinner.  Talk to your family members or close friends when making important decisions.  Follow up with your doctor as told.  Watch your symptoms. Tell others to do the same. Serious problems can sometimes happen after a concussion. Older adults are more likely to have these problems.  Tell your teachers, school nurse, school counselor, coach, Event organiser, or work Production designer, theatre/television/film about your concussion. Tell them about what you can or cannot do. They should watch to see if:  It gets even harder for you to pay attention or concentrate.  It gets even harder for you to remember things or learn new things.  You need more time than normal to finish things.  You become annoyed (irritable) more than before.  You are not able to deal with stress as well.  You have more problems than before.  Rest. Make sure you:  Get plenty of sleep at night.  Go to sleep early.  Go to  bed at the same time every day. Try to wake up at the same time.  Rest during the day.  Take naps when you feel tired.  Limit activities where you have to think a lot or concentrate. These include:  Doing homework.  Doing work related to a job.  Watching TV.  Using the computer. Returning To Your Regular Activities Return to your normal activities slowly, not all at once. You must give your body and brain enough time to heal.   Do not play sports or do other athletic activities until your doctor says it is okay.  Ask your doctor when you can drive, ride a bicycle, or work other vehicles or machines. Never do these things if you feel dizzy.  Ask your doctor about when you can return to work or school. Preventing Another Concussion It is very important to avoid another brain injury, especially before you have healed. In rare cases, another injury can lead to permanent brain damage, brain swelling, or death. The risk of this is greatest during the first 7-10 days after your injury. Avoid injuries by:   Wearing a seat belt when riding in a car.  Not drinking too much alcohol.  Avoiding activities that could lead to a second concussion (such as contact sports).  Wearing a helmet when doing activities like:  Biking.  Skiing.  Skateboarding.  Skating.  Making your home safer  by:  Removing things from the floor or stairways that could make you trip.  Using grab bars in bathrooms and handrails by stairs.  Placing non-slip mats on floors and in bathtubs.  Improve lighting in dark areas. GET HELP IF:  It gets even harder for you to pay attention or concentrate.  It gets even harder for you to remember things or learn new things.  You need more time than normal to finish things.  You become annoyed (irritable) more than before.  You are not able to deal with stress as well.  You have more problems than before.  You have problems keeping your balance.  You are  not able to react quickly when you should. Get help if you have any of these problems for more than 2 weeks:   Lasting (chronic) headaches.  Dizziness or trouble balancing.  Feeling sick to your stomach (nausea).  Seeing (vision) problems.  Being affected by noises or light more than normal.  Feeling sad, low, down in the dumps, blue, gloomy, or empty (depressed).  Mood changes (mood swings).  Feeling of fear or nervousness about what may happen (anxiety).  Feeling annoyed.  Memory problems.  Problems concentrating or paying attention.  Sleep problems.  Feeling tired all the time. GET HELP RIGHT AWAY IF:   You have bad headaches or your headaches get worse.  You have weakness (even if it is in one hand, leg, or part of the face).  You have loss of feeling (numbness).  You feel off balance.  You keep throwing up (vomiting).  You feel tired.  One black center of your eye (pupil) is larger than the other.  You twitch or shake violently (convulse).  Your speech is not clear (slurred).  You are more confused, easily angered (agitated), or annoyed than before.  You have more trouble resting than before.  You are unable to recognize people or places.  You have neck pain.  It is difficult to wake you up.  You have unusual behavior changes.  You pass out (lose consciousness). MAKE SURE YOU:   Understand these instructions.  Will watch your condition.  Will get help right away if you are not doing well or get worse.   This information is not intended to replace advice given to you by your health care provider. Make sure you discuss any questions you have with your health care provider.   Document Released: 05/10/2009 Document Revised: 06/12/2014 Document Reviewed: 12/12/2012 Elsevier Interactive Patient Education [employer]2016 [employer]

## 2015-08-29 NOTE — ED Notes (Signed)
Patient refused to allow RN to get discharge vitals. Patient refused to sign discharge states he is being sent home and he has a headache.

## 2015-08-29 NOTE — ED Notes (Signed)
Pt agitated, says he wants to leave unless he can get something for neck pain. Pt has taken C-Collar off and is walking around room, saying he doesn't want to do CT scan. MD notified

## 2015-08-29 NOTE — ED Provider Notes (Signed)
CSN: [id number]     Arrival date & time 08/29/15  0433 History   First MD Initiated Contact with Patient 08/29/15 0447     Chief Complaint  Patient presents with  . Head Injury     (Consider location/radiation/quality/duration/timing/severity/associated sxs/prior Treatment) HPI  NAGI FURIO is a 25 y.o. male with past medical history of chronic pain and heroin abuse presenting today after a fall. Patient was shaking a call tonight and fell at his house. He has pain in the back of his head and behind his bilateral ears. Denies any bleeding. He denies loss of consciousness or pain elsewhere. There are no further complaints.  10 Systems reviewed and are negative for acute change except as noted in the HPI.      Past Medical History  Diagnosis Date  . Spinal cord injury at C5-C7 level without injury of spinal bone (HCC) 2011  . Nerve root avulsion   . Chronic pain syndrome   . Heroin abuse    Past Surgical History  Procedure Laterality Date  . Nerve root repair     History reviewed. No pertinent family history. Social History  Substance Use Topics  . Smoking status: Current Some Day Smoker -- 0.20 packs/day for 7 years    Types: Cigarettes  . Smokeless tobacco: Never Used     Comment: decreased smoking  . Alcohol Use: 0.0 oz/week    0 Standard drinks or equivalent per week     Comment: occasional    Review of Systems    Allergies  Morphine and related and Ms contin  Home Medications   Prior to Admission medications   Medication Sig Start Date End Date Taking? Authorizing Provider  amitriptyline (ELAVIL) 50 MG tablet Take 1 tablet (50 mg total) by mouth at bedtime. Patient not taking: Reported on 11/13/2014 06/10/14   Narda Bonds, MD  Calcium Carb-Cholecalciferol (CALCIUM PLUS VITAMIN D3) 600-500 MG-UNIT CAPS Take 1 capsule by mouth daily. Patient not taking: Reported on 08/29/2015 04/10/14   Narda Bonds, MD  predniSONE (DELTASONE) 50 MG tablet Take 1 tablet (50  mg total) by mouth daily. Patient not taking: Reported on 08/29/2015 01/27/15   Charlestine Night, PA-C   BP 133/82 mmHg  Pulse 91  Temp(Src) 97.7 F (36.5 C) (Oral)  Resp 18  Ht  (1.854 m)  Wt 265 lb (120.203 kg)  BMI 34.97 kg/m2  SpO2 96% Physical Exam  Constitutional: He is oriented to person, place, and time. Vital signs are normal. He appears well-developed and well-nourished.  Non-toxic appearance. He does not appear ill. No distress.  Clinically intoxicated.  HENT:  Head: Normocephalic.  Nose: Nose normal.  Mouth/Throat: Oropharynx is clear and moist. No oropharyngeal exudate.  Small 1 cm soft tissue hematoma seen to the back of the skull.  Eyes: Conjunctivae and EOM are normal. Pupils are equal, round, and reactive to light. No scleral icterus.  Neck: Normal range of motion. Neck supple. No tracheal deviation, no edema, no erythema and normal range of motion present. No thyroid mass and no thyromegaly present.  C-collar in place  Cardiovascular: Normal rate, regular rhythm, S1 normal, S2 normal, normal heart sounds, intact distal pulses and normal pulses.  Exam reveals no gallop and no friction rub.   No murmur heard. Pulmonary/Chest: Effort normal and breath sounds normal. No respiratory distress. He has no wheezes. He has no rhonchi. He has no rales.  Abdominal: Soft. Normal appearance and bowel sounds are normal. He exhibits no  distension, no ascites and no mass. There is no hepatosplenomegaly. There is no tenderness. There is no rebound, no guarding and no CVA tenderness.  Musculoskeletal: Normal range of motion. He exhibits no edema or tenderness.  Lymphadenopathy:    He has no cervical adenopathy.  Neurological: He is alert and oriented to person, place, and time. He has normal strength. No cranial nerve deficit or sensory deficit.  Normal strength and sensation in all extremities.  Skin: Skin is warm, dry and intact. No petechiae and no rash noted. He is not  diaphoretic. No erythema. No pallor.  Psychiatric: He has a normal mood and affect. His behavior is normal. Judgment normal.  Nursing note and vitals reviewed.   ED Course  Procedures (including critical care time) Labs Review Labs Reviewed - No data to display  Imaging Review Ct Head Wo Contrast  08/29/2015  CLINICAL DATA:  Fall, striking back of head. History of heroin abuse. EXAM: CT HEAD WITHOUT CONTRAST CT CERVICAL SPINE WITHOUT CONTRAST TECHNIQUE: Multidetector CT imaging of the head and cervical spine was performed following the standard protocol without intravenous contrast. Multiplanar CT image reconstructions of the cervical spine were also generated. COMPARISON:  CT cervical myelogram August 23, 2009 FINDINGS: CT HEAD FINDINGS INTRACRANIAL CONTENTS: The ventricles and sulci are normal. No intraparenchymal hemorrhage, mass effect nor midline shift. No acute large vascular territory infarcts. No abnormal extra-axial fluid collections. Basal cisterns are patent. ORBITS: Cervical vertebral bodies and posterior elements are intact and aligned with maintenance of the cervical lordosis. Intervertebral disc heights preserved. No destructive bony lesions. C1-2 articulation maintained. Included prevertebral and paraspinal soft tissues are unremarkable. SINUSES: The mastoid aircells and included paranasal sinuses are well-aerated. SKULL/SOFT TISSUES: Moderate LEFT posterior scalp hematoma without subcutaneous gas or radiopaque foreign bodies. No skull fracture. CT CERVICAL SPINE FINDINGS Moderate motion to the upper cervical spine. Posterior arch of C1 is developmentally on fused. Cervical vertebral bodies and posterior elements are intact and aligned with straightened cervical lordosis. Intervertebral disc heights preserved. No destructive bony lesions. C1-2 articulation maintained. Included prevertebral and paraspinal soft tissues are unremarkable. IMPRESSION: CT HEAD: Moderate LEFT posterior scalp  hematoma.  No skull fracture. Otherwise negative CT head. CT CERVICAL SPINE: Moderate motion through upper cervical spine without convincing evidence of acute fracture though if symptoms persist consider repeat evaluation. No malalignment. Electronically Signed   By: Awilda Metro M.D.   On: 08/29/2015 06:47   Ct Cervical Spine Wo Contrast  08/29/2015  CLINICAL DATA:  Fall, striking back of head. History of heroin abuse. EXAM: CT HEAD WITHOUT CONTRAST CT CERVICAL SPINE WITHOUT CONTRAST TECHNIQUE: Multidetector CT imaging of the head and cervical spine was performed following the standard protocol without intravenous contrast. Multiplanar CT image reconstructions of the cervical spine were also generated. COMPARISON:  CT cervical myelogram August 23, 2009 FINDINGS: CT HEAD FINDINGS INTRACRANIAL CONTENTS: The ventricles and sulci are normal. No intraparenchymal hemorrhage, mass effect nor midline shift. No acute large vascular territory infarcts. No abnormal extra-axial fluid collections. Basal cisterns are patent. ORBITS: Cervical vertebral bodies and posterior elements are intact and aligned with maintenance of the cervical lordosis. Intervertebral disc heights preserved. No destructive bony lesions. C1-2 articulation maintained. Included prevertebral and paraspinal soft tissues are unremarkable. SINUSES: The mastoid aircells and included paranasal sinuses are well-aerated. SKULL/SOFT TISSUES: Moderate LEFT posterior scalp hematoma without subcutaneous gas or radiopaque foreign bodies. No skull fracture. CT CERVICAL SPINE FINDINGS Moderate motion to the upper cervical spine. Posterior arch of C1 is developmentally  on fused. Cervical vertebral bodies and posterior elements are intact and aligned with straightened cervical lordosis. Intervertebral disc heights preserved. No destructive bony lesions. C1-2 articulation maintained. Included prevertebral and paraspinal soft tissues are unremarkable. IMPRESSION: CT  HEAD: Moderate LEFT posterior scalp hematoma.  No skull fracture. Otherwise negative CT head. CT CERVICAL SPINE: Moderate motion through upper cervical spine without convincing evidence of acute fracture though if symptoms persist consider repeat evaluation. No malalignment. Electronically Signed   By: Awilda Metroourtnay  Bloomer M.D.   On: 08/29/2015 06:47   I have personally reviewed and evaluated these images and lab results as part of my medical decision-making.   EKG Interpretation None      MDM   Final diagnoses:  None    Patient presents emergency department after a fall. He has been noncompliant with care here, he is taken off the surgicalCall her several times. Patient is refusing to get a CT scan before he gets pain medication. He was ordered Dilaudid intramuscularly. Will obtain CT scan for evaluation of injury.  6:50 AM CT scan neg for any injury.  Will DC with PCP fu . Concussion education provided.  He appears well and in NAD.  VS remain within his normal limits and he is safe for DC.   Tomasita CrumbleAdeleke Judd Mccubbin, MD 08/29/15 [phone]

## 2015-08-29 NOTE — ED Notes (Signed)
MD assessing at bedside.

## 2015-08-29 NOTE — ED Notes (Signed)
Pt in via EMS c/o a head injury, pt was sitting on some steps and rocked backwards and hit his head, reports LOC x1 min, hematoma to back of head noted, no vomiting, c/o severe neck and back pain, c-collar in place on arrival, no distress noted

## 2015-09-01 ENCOUNTER — Encounter (HOSPITAL_COMMUNITY): Payer: Self-pay

## 2015-09-01 ENCOUNTER — Emergency Department (HOSPITAL_COMMUNITY): Payer: Self-pay

## 2015-09-01 ENCOUNTER — Emergency Department (HOSPITAL_COMMUNITY)
Admission: EM | Admit: 2015-09-01 | Discharge: 2015-09-01 | Disposition: A | Discharge status: Home / Self Care | Payer: Self-pay | Attending: Emergency Medicine | Admitting: Emergency Medicine

## 2015-09-01 DIAGNOSIS — R6884 Jaw pain: Secondary | ICD-10-CM | POA: Insufficient documentation

## 2015-09-01 DIAGNOSIS — H9203 Otalgia, bilateral: Secondary | ICD-10-CM | POA: Insufficient documentation

## 2015-09-01 DIAGNOSIS — G894 Chronic pain syndrome: Secondary | ICD-10-CM | POA: Insufficient documentation

## 2015-09-01 DIAGNOSIS — W01198D Fall on same level from slipping, tripping and stumbling with subsequent striking against other object, subsequent encounter: Secondary | ICD-10-CM | POA: Insufficient documentation

## 2015-09-01 DIAGNOSIS — M542 Cervicalgia: Secondary | ICD-10-CM | POA: Insufficient documentation

## 2015-09-01 DIAGNOSIS — F17210 Nicotine dependence, cigarettes, uncomplicated: Secondary | ICD-10-CM | POA: Insufficient documentation

## 2015-09-01 DIAGNOSIS — S060X9D Concussion with loss of consciousness of unspecified duration, subsequent encounter: Secondary | ICD-10-CM | POA: Insufficient documentation

## 2015-09-01 MED ORDER — NAPROXEN 500 MG PO TABS: 500.0000 mg | ORAL_TABLET | Freq: Two times a day (BID) | ORAL | Status: DC

## 2015-09-01 MED ORDER — CARISOPRODOL 350 MG PO TABS: 350.0000 mg | ORAL_TABLET | Freq: Three times a day (TID) | ORAL | Status: DC

## 2015-09-01 NOTE — ED Provider Notes (Signed)
CSN: [id number]     Arrival date & time 09/01/15  1206 History   First MD Initiated Contact with Patient 09/01/15 1358     Chief Complaint  Patient presents with  . Neck Pain    Patient is a 25 y.o. male presenting with neck pain.  Neck Pain  Patient presents to the emergency room with complaints of persistent headache, neck pain and now jaw pain. The patient was at home March 26. Patient had a fall and struck the back of his head. He came [facility] at [facility] early in the morning on March 26. He was complaining of pain in the back of his head and his bilateral ears. There is no loss of consciousness. No other injuries. Patient states he was seen in the emergency room and had a CAT scan of his head and neck. He continues to have pain however in the back of his head and neck. Movement and palpation of the neck, head and even his hair increases the pain. He also felt like his jaw is locked up for a couple of days after the fall that seems to be somewhat better now. He does have pain with movement of his mouth and chewing. Ears or chills. No numbness or weakness. Past Medical History  Diagnosis Date  . Spinal cord injury at C5-C7 level without injury of spinal bone (HCC) 2011  . Nerve root avulsion   . Chronic pain syndrome   . Heroin abuse    Past Surgical History  Procedure Laterality Date  . Nerve root repair     History reviewed. No pertinent family history. Social History  Substance Use Topics  . Smoking status: Current Some Day Smoker -- 0.20 packs/day for 7 years    Types: Cigarettes  . Smokeless tobacco: Never Used     Comment: decreased smoking  . Alcohol Use: 0.0 oz/week    0 Standard drinks or equivalent per week     Comment: occasional    Review of Systems  Musculoskeletal: Positive for neck pain.  All other systems reviewed and are negative.     Allergies  Morphine and related and Ms contin  Home Medications   Prior to Admission medications    Medication Sig Start Date End Date Taking? Authorizing Provider  Oxycodone HCl 20 MG TABS Take 1 tablet by mouth 4 (four) times daily.   Yes Historical Provider, MD  carisoprodol (SOMA) 350 MG tablet Take 1 tablet (350 mg total) by mouth 3 (three) times daily. 09/01/15   Linwood DibblesJon Karyn Brull, MD  naproxen (NAPROSYN) 500 MG tablet Take 1 tablet (500 mg total) by mouth 2 (two) times daily. 09/01/15   Linwood DibblesJon Jinnifer Montejano, MD   BP 131/92 mmHg  Pulse 78  Temp(Src) 98.1 F (36.7 C) (Oral)  Resp 18  SpO2 100% Physical Exam  Constitutional: He appears well-developed and well-nourished. No distress.  HENT:  Head: Normocephalic and atraumatic.  Right Ear: External ear normal. No hemotympanum.  Left Ear: External ear normal. No hemotympanum.  Mouth/Throat: No uvula swelling.  ttp bilateral angles of jaw, no edema or swelling; ttp posterior scalp  Eyes: Conjunctivae are normal. Right eye exhibits no discharge. Left eye exhibits no discharge. No scleral icterus.  Neck: Neck supple. Tracheal tenderness and muscular tenderness present. No rigidity. No tracheal deviation present.  Cardiovascular: Normal rate, regular rhythm and intact distal pulses.   Pulmonary/Chest: Effort normal and breath sounds normal. No stridor. No respiratory distress. He has no wheezes. He has  no rales.  Abdominal: Soft. Bowel sounds are normal. He exhibits no distension. There is no tenderness. There is no rebound and no guarding.  Musculoskeletal: He exhibits no edema or tenderness.  Neurological: He is alert. He has normal strength. No cranial nerve deficit (no facial droop, extraocular movements intact, no slurred speech) or sensory deficit. He exhibits normal muscle tone. He displays no seizure activity. Coordination normal.  Skin: Skin is warm and dry. No rash noted.  Psychiatric: He has a normal mood and affect.  Nursing note and vitals reviewed.   ED Course  Procedures (including critical care time) Labs Review Labs Reviewed - No data  to display  Imaging Review Ct Cervical Spine Wo Contrast  09/01/2015  CLINICAL DATA:  Bilateral neck and ear pain since falling on stairs at home 2 days ago. History of spinal cord injury. EXAM: CT MAXILLOFACIAL WITHOUT CONTRAST CT CERVICAL SPINE WITHOUT CONTRAST TECHNIQUE: Multidetector CT imaging of the maxillofacial structures was performed. Multiplanar CT image reconstructions were also generated. A small metallic BB was placed on the right temple in order to reliably differentiate right from left. Multidetector CT imaging of the cervical spine was performed without intravenous contrast. Multiplanar CT image reconstructions were also generated. COMPARISON:  CTs 08/29/2015. FINDINGS: No evidence of acute maxillofacial fracture. The mandible and temporomandibular joints are intact. The mastoid air cells and paranasal sinuses are clear without air- fluid levels. There is no evidence of orbital hematoma. The optic nerves and extraocular muscles appear normal. No focal soft tissue swelling or foreign body observed. The cervical alignment is normal. There is no evidence of acute fracture or traumatic subluxation. The posterior arch of C1 is incomplete with associated thickening of the anterior arch, a normal variant. No acute soft tissue findings are demonstrated. There is no osseous foraminal narrowing. IMPRESSION: 1. No evidence of acute maxillofacial fracture. The paranasal sinuses, mastoid air cells and orbits appear unremarkable. 2. No evidence of acute cervical spine fracture, traumatic subluxation or static signs of instability. Electronically Signed   By: Carey Bullocks M.D.   On: 09/01/2015 15:21   Ct Maxillofacial Wo Cm  09/01/2015  CLINICAL DATA:  Bilateral neck and ear pain since falling on stairs at home 2 days ago. History of spinal cord injury. EXAM: CT MAXILLOFACIAL WITHOUT CONTRAST CT CERVICAL SPINE WITHOUT CONTRAST TECHNIQUE: Multidetector CT imaging of the maxillofacial structures was  performed. Multiplanar CT image reconstructions were also generated. A small metallic BB was placed on the right temple in order to reliably differentiate right from left. Multidetector CT imaging of the cervical spine was performed without intravenous contrast. Multiplanar CT image reconstructions were also generated. COMPARISON:  CTs 08/29/2015. FINDINGS: No evidence of acute maxillofacial fracture. The mandible and temporomandibular joints are intact. The mastoid air cells and paranasal sinuses are clear without air- fluid levels. There is no evidence of orbital hematoma. The optic nerves and extraocular muscles appear normal. No focal soft tissue swelling or foreign body observed. The cervical alignment is normal. There is no evidence of acute fracture or traumatic subluxation. The posterior arch of C1 is incomplete with associated thickening of the anterior arch, a normal variant. No acute soft tissue findings are demonstrated. There is no osseous foraminal narrowing. IMPRESSION: 1. No evidence of acute maxillofacial fracture. The paranasal sinuses, mastoid air cells and orbits appear unremarkable. 2. No evidence of acute cervical spine fracture, traumatic subluxation or static signs of instability. Electronically Signed   By: Carey Bullocks M.D.   On: 09/01/2015  15:21   I have personally reviewed and evaluated these images and lab results as part of my medical decision-making.    MDM   Final diagnoses:  Neck pain  Concussion, with loss of consciousness of unspecified duration, subsequent encounter    Pt without signs of fx or dislocation no the CT scan.  Pt has oral opiates he takes regularly.  Will rx nsaids and muscle relaxant.  Follow up with PCP.  Consider chiropractic or PT  treatment.    Linwood Dibbles, MD 09/01/15 1556

## 2015-09-01 NOTE — ED Notes (Signed)
Patient was alert, oriented and stable upon discharge. RN went over AVS and patient had no further questions.  

## 2015-09-01 NOTE — Progress Notes (Addendum)
ED Cm spoke with pt after noting [facility] in February-March 2016  Pt confirms with ED Cm that he is seeing Jeffery Billee CashingWayland Baldwin on a monthly basis Pt states he is not on a pain contract and has an upcoming appt with Baldwin on 09/02/15 ED CM called and spoke with Jeffery Trilby DrummerW Baldwin to confirm pt is on a pain contract with him on a monthly basis Reports pt receives Oxycodone 100 mg qid and the last prescription obtained was on 08/06/15 Confirms pt to be seen on 09/02/15 for f/u care Jeffery Baldwin updated on [facility] 08/29/15 with im dilaudid-dx concusssion after falling and hitting his head and [facility] 09/01/15- Jeffery Lynelle DoctorKnapp near ED Cm and states pt to receive Toradol Jeffery Trilby DrummerW Baldwin confirms this would be ok for treatment   Entered in d/c instructions  Baldwin, Jeffery Baldwin on 09/03/2015 You have a scheduled monthly apppointment with Jeffery Billee CashingWayland Baldwin on 09/02/15 at [id number] [place] [id number] [phone]

## 2015-09-01 NOTE — Discharge Instructions (Signed)
Cervical Sprain  A cervical sprain is an injury in the neck in which the strong, fibrous tissues (ligaments) that connect your neck bones stretch or tear. Cervical sprains can range from mild to severe. Severe cervical sprains can cause the neck vertebrae to be unstable. This can lead to damage of the spinal cord and can result in serious nervous system problems. The amount of time it takes for a cervical sprain to get better depends on the cause and extent of the injury. Most cervical sprains heal in 1 to 3 weeks.  CAUSES   Severe cervical sprains may be caused by:    Contact sport injuries (such as from football, rugby, wrestling, hockey, auto racing, gymnastics, diving, martial arts, or boxing).    Motor vehicle collisions.    Whiplash injuries. This is an injury from a sudden forward and backward whipping movement of the head and neck.   Falls.   Mild cervical sprains may be caused by:    Being in an awkward position, such as while cradling a telephone between your ear and shoulder.    Sitting in a chair that does not offer proper support.    Working at a poorly designed computer station.    Looking up or down for long periods of time.   SYMPTOMS    Pain, soreness, stiffness, or a burning sensation in the front, back, or sides of the neck. This discomfort may develop immediately after the injury or slowly, 24 hours or more after the injury.    Pain or tenderness directly in the middle of the back of the neck.    Shoulder or upper back pain.    Limited ability to move the neck.    Headache.    Dizziness.    Weakness, numbness, or tingling in the hands or arms.    Muscle spasms.    Difficulty swallowing or chewing.    Tenderness and swelling of the neck.   DIAGNOSIS   Most of the time your health care provider can diagnose a cervical sprain by taking your history and doing a physical exam. Your health care provider will ask about previous neck injuries and any known neck  problems, such as arthritis in the neck. X-rays may be taken to find out if there are any other problems, such as with the bones of the neck. Other tests, such as a CT scan or MRI, may also be needed.   TREATMENT   Treatment depends on the severity of the cervical sprain. Mild sprains can be treated with rest, keeping the neck in place (immobilization), and pain medicines. Severe cervical sprains are immediately immobilized. Further treatment is done to help with pain, muscle spasms, and other symptoms and may include:   Medicines, such as pain relievers, numbing medicines, or muscle relaxants.    Physical therapy. This may involve stretching exercises, strengthening exercises, and posture training. Exercises and improved posture can help stabilize the neck, strengthen muscles, and help stop symptoms from returning.   HOME CARE INSTRUCTIONS    Put ice on the injured area.     Put ice in a plastic bag.     Place a towel between your skin and the bag.     Leave the ice on for 15-20 minutes, 3-4 times a day.    If your injury was severe, you may have been given a cervical collar to wear. A cervical collar is a two-piece collar designed to keep your neck from moving while it heals.      Do not remove the collar unless instructed by your health care provider.    If you have long hair, keep it outside of the collar.    Ask your health care provider before making any adjustments to your collar. Minor adjustments may be required over time to improve comfort and reduce pressure on your chin or on the back of your head.    Ifyou are allowed to remove the collar for cleaning or bathing, follow your health care provider's instructions on how to do so safely.    Keep your collar clean by wiping it with mild soap and water and drying it completely. If the collar you have been given includes removable pads, remove them every 1-2 days and hand wash them with soap and water. Allow them to air dry. They should be completely  dry before you wear them in the collar.    If you are allowed to remove the collar for cleaning and bathing, wash and dry the skin of your neck. Check your skin for irritation or sores. If you see any, tell your health care provider.    Do not drive while wearing the collar.    Only take over-the-counter or prescription medicines for pain, discomfort, or fever as directed by your health care provider.    Keep all follow-up appointments as directed by your health care provider.    Keep all physical therapy appointments as directed by your health care provider.    Make any needed adjustments to your workstation to promote good posture.    Avoid positions and activities that make your symptoms worse.    Warm up and stretch before being active to help prevent problems.   SEEK MEDICAL CARE IF:    Your pain is not controlled with medicine.    You are unable to decrease your pain medicine over time as planned.    Your activity level is not improving as expected.   SEEK IMMEDIATE MEDICAL CARE IF:    You develop any bleeding.   You develop stomach upset.   You have signs of an allergic reaction to your medicine.    Your symptoms get worse.    You develop new, unexplained symptoms.    You have numbness, tingling, weakness, or paralysis in any part of your body.   MAKE SURE YOU:    Understand these instructions.   Will watch your condition.   Will get help right away if you are not doing well or get worse.     This information is not intended to replace advice given to you by your health care provider. Make sure you discuss any questions you have with your health care provider.     Document Released: 03/19/2007 Document Revised: 05/27/2013 Document Reviewed: 11/27/2012  Elsevier Interactive Patient Education 2016 [employer]
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
## 2015-09-01 NOTE — Progress Notes (Addendum)
Form 11/2014   [facility]  He gets oxycodone 30 mg q 6 hours prn three month refills at a time. He should not receive opioids/benzos/stimulants from any other provider   Pt with 2 visits in the last week [facility] refusing to sign out with ED RN on 08/29/15 - had initially refused CT to check for head injury but later consented - CT was negative   [facility] informed him he had a concussion
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
## 2016-06-05 IMAGING — CR DG CHEST 1V PORT
1 series · 1 of 1 positions shown · non-contrast
Comparison: None.

CLINICAL DATA: Right chest pain, shortness of breath, fever

EXAM:
PORTABLE CHEST - 1 VIEW

[AP]
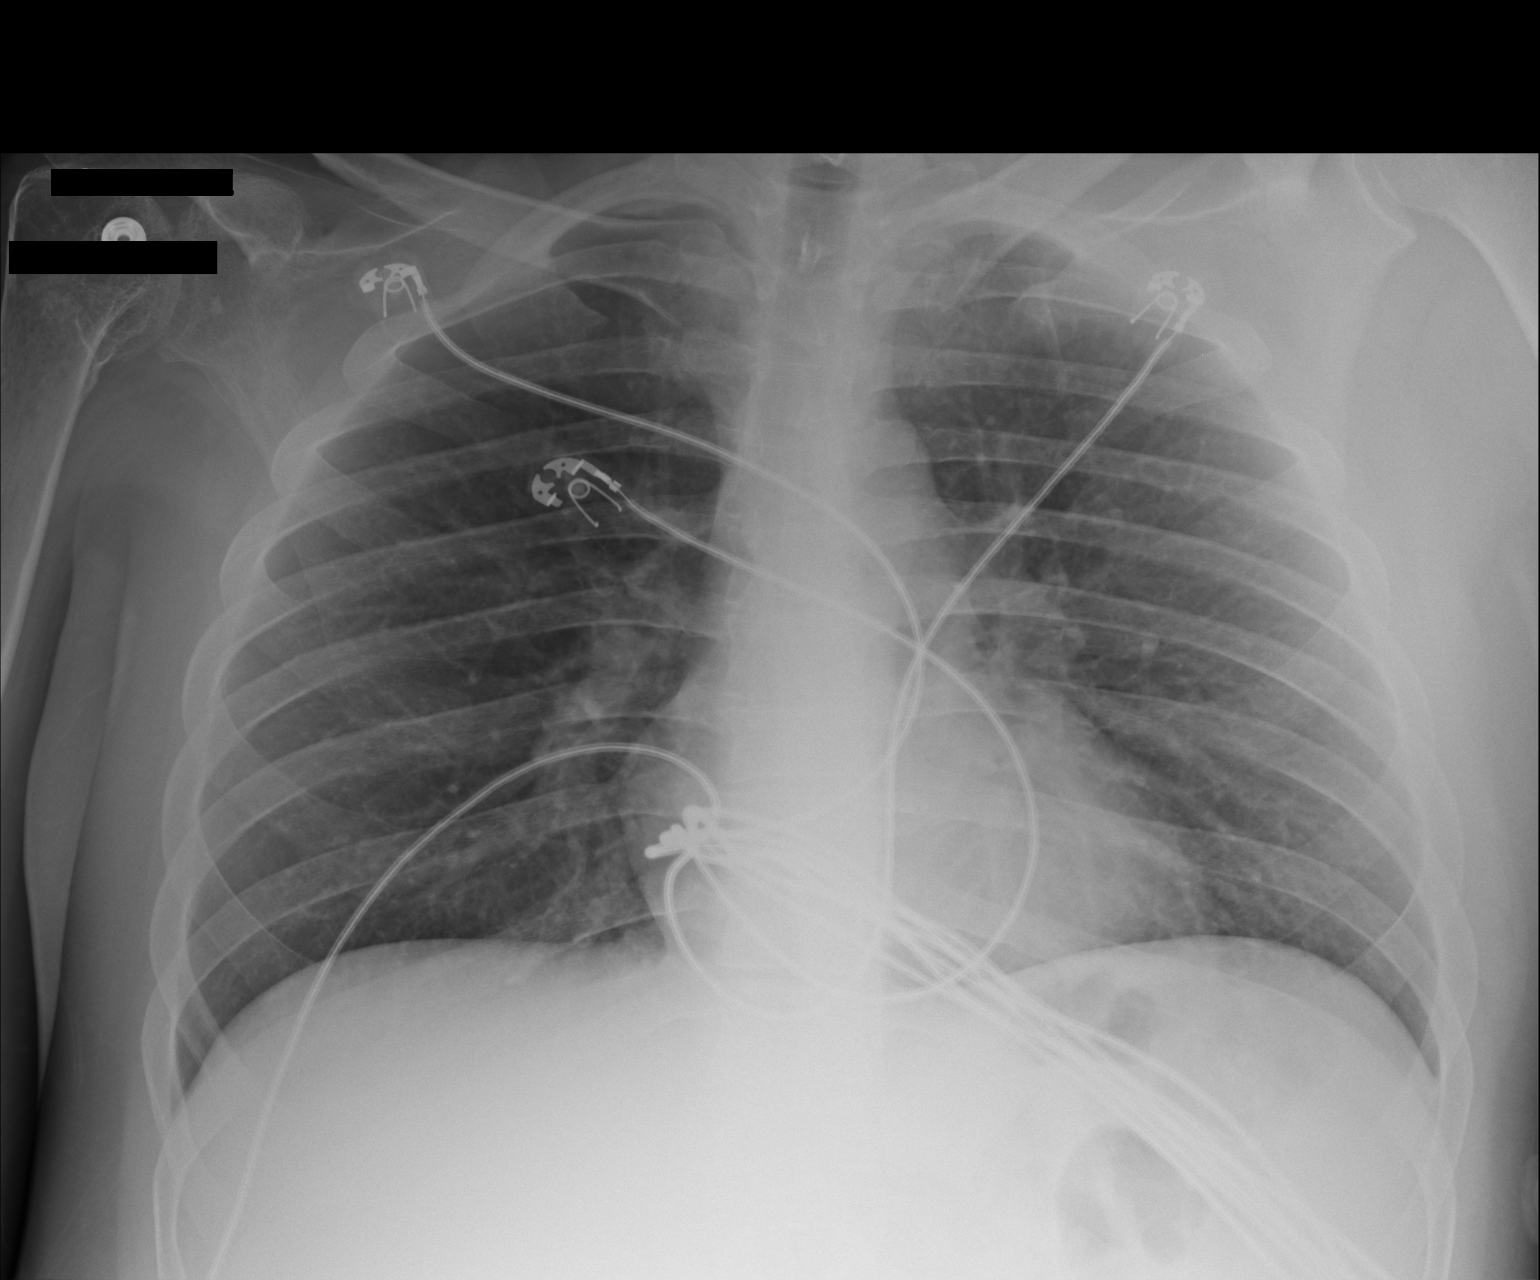

[1 of 1 positions shown; findings below may reference images not displayed]

FINDINGS: Lungs are clear.  No pleural effusion or pneumothorax.

The heart is normal in size.

Degenerative changes of the right shoulder/humeral head.
IMPRESSION: No evidence of acute cardiopulmonary disease.

## 2016-09-20 ENCOUNTER — Encounter (HOSPITAL_COMMUNITY): Payer: Self-pay | Admitting: Emergency Medicine

## 2016-09-20 ENCOUNTER — Ambulatory Visit (HOSPITAL_COMMUNITY)
Admission: EM | Admit: 2016-09-20 | Discharge: 2016-09-20 | Disposition: A | Discharge status: Home / Self Care | Payer: Self-pay | Attending: Family Medicine | Admitting: Family Medicine

## 2016-09-20 DIAGNOSIS — Z760 Encounter for issue of repeat prescription: Secondary | ICD-10-CM

## 2016-09-20 DIAGNOSIS — G894 Chronic pain syndrome: Secondary | ICD-10-CM

## 2016-09-20 MED ORDER — OXYCODONE HCL 20 MG PO TABS: ORAL_TABLET | ORAL | 0 refills | Status: DC

## 2016-09-20 NOTE — Discharge Instructions (Signed)
Unfortunately the urgent cares and not allowed to refill the chronic pain medications. You are given #10 tablets for today but we will not be able to continue to fill his medications. Recommend you take a half a tablet twice a day to help keep from getting withdrawal.
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
## 2016-09-20 NOTE — ED Triage Notes (Signed)
The patient presented to the [facility] with a complaint of needing his narcotic medications refilled.

## 2016-09-20 NOTE — ED Notes (Signed)
Pt called to Room 5 x1.

## 2016-09-21 ENCOUNTER — Emergency Department (HOSPITAL_COMMUNITY)
Admission: EM | Admit: 2016-09-21 | Discharge: 2016-09-21 | Disposition: A | Discharge status: Home / Self Care | Payer: Self-pay | Attending: Emergency Medicine | Admitting: Emergency Medicine

## 2016-09-21 ENCOUNTER — Encounter (HOSPITAL_COMMUNITY): Payer: Self-pay | Admitting: *Deleted

## 2016-09-21 DIAGNOSIS — I10 Essential (primary) hypertension: Secondary | ICD-10-CM | POA: Insufficient documentation

## 2016-09-21 DIAGNOSIS — Z79899 Other long term (current) drug therapy: Secondary | ICD-10-CM | POA: Insufficient documentation

## 2016-09-21 DIAGNOSIS — Z760 Encounter for issue of repeat prescription: Secondary | ICD-10-CM | POA: Insufficient documentation

## 2016-09-21 DIAGNOSIS — F17210 Nicotine dependence, cigarettes, uncomplicated: Secondary | ICD-10-CM | POA: Insufficient documentation

## 2016-09-21 NOTE — Discharge Instructions (Signed)
Please read instructions below. You can space out your doses of Oxycodone. You can also take over the counter advil or aleve for added pain relief. You can apply ice to the area for 20 minutes at a time. You can also apply heat. Return to the ER for new symptoms.
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
## 2016-10-24 ENCOUNTER — Emergency Department (HOSPITAL_COMMUNITY)
Admission: EM | Admit: 2016-10-24 | Discharge: 2016-10-24 | Disposition: A | Discharge status: Home / Self Care | Payer: Self-pay | Attending: Emergency Medicine | Admitting: Emergency Medicine

## 2016-10-24 ENCOUNTER — Encounter (HOSPITAL_COMMUNITY): Payer: Self-pay

## 2016-10-24 DIAGNOSIS — Z5321 Procedure and treatment not carried out due to patient leaving prior to being seen by health care provider: Secondary | ICD-10-CM | POA: Insufficient documentation

## 2016-10-24 DIAGNOSIS — R1084 Generalized abdominal pain: Secondary | ICD-10-CM | POA: Insufficient documentation

## 2016-10-24 DIAGNOSIS — F17210 Nicotine dependence, cigarettes, uncomplicated: Secondary | ICD-10-CM | POA: Insufficient documentation

## 2016-10-24 LAB — COMPREHENSIVE METABOLIC PANEL
ALT: 40 U/L (ref 17–63)
AST: 33 U/L (ref 15–41)
Albumin: 5 g/dL (ref 3.5–5.0)
Alkaline Phosphatase: 69 U/L (ref 38–126)
Anion gap: 10 (ref 5–15)
BUN: 6 mg/dL (ref 6–20)
CO2: 25 mmol/L (ref 22–32)
Calcium: 9.9 mg/dL (ref 8.9–10.3)
Chloride: 100 mmol/L — ABNORMAL LOW (ref 101–111)
Creatinine, Ser: 0.8 mg/dL (ref 0.61–1.24)
GFR calc Af Amer: >60 mL/min (ref >60)
GFR calc non Af Amer: >60 mL/min (ref >60)
Glucose, Bld: 113 mg/dL — ABNORMAL HIGH (ref 65–99)
POTASSIUM: 3.7 mmol/L (ref 3.5–5.1)
SODIUM: 135 mmol/L (ref 135–145)
TOTAL PROTEIN: 6.9 g/dL (ref 6.5–8.1)
Total Bilirubin: 1.4 mg/dL — ABNORMAL HIGH (ref 0.3–1.2)

## 2016-10-24 LAB — CBC
HEMATOCRIT: 43.3 % (ref 39.0–52.0)
Hemoglobin: 15.5 g/dL (ref 13.0–17.0)
MCH: 32 pg (ref 26.0–34.0)
MCHC: 35.8 g/dL (ref 30.0–36.0)
MCV: 89.3 fL (ref 78.0–100.0)
Platelets: 167 10*3/uL (ref 150–400)
RBC: 4.85 MIL/uL (ref 4.22–5.81)
RDW: 12.3 % (ref 11.5–15.5)
WBC: 10.1 10*3/uL (ref 4.0–10.5)

## 2016-10-24 LAB — LIPASE, BLOOD: Lipase: 552 U/L — ABNORMAL HIGH (ref 11–51)

## 2016-10-24 NOTE — ED Notes (Signed)
Pt called several times, no response. 

## 2016-10-24 NOTE — ED Triage Notes (Signed)
Pt reports generalized abdominal pain that radiates to his back associated with nausea; onset today around 12pm. LBM yesterday

## 2016-10-24 NOTE — ED Notes (Signed)
Called 3x no response 

## 2016-10-24 NOTE — ED Notes (Signed)
Name called for triage- no answer 

## 2019-12-20 ENCOUNTER — Other Ambulatory Visit: Payer: Self-pay

## 2019-12-20 ENCOUNTER — Ambulatory Visit (HOSPITAL_COMMUNITY)
Admission: EM | Admit: 2019-12-20 | Discharge: 2019-12-20 | Disposition: A | Discharge status: Home / Self Care | Payer: No Payment, Other | Attending: Nurse Practitioner | Admitting: Nurse Practitioner

## 2019-12-20 DIAGNOSIS — F410 Panic disorder [episodic paroxysmal anxiety] without agoraphobia: Secondary | ICD-10-CM | POA: Diagnosis not present

## 2019-12-20 DIAGNOSIS — G4700 Insomnia, unspecified: Secondary | ICD-10-CM | POA: Insufficient documentation

## 2019-12-20 DIAGNOSIS — G8321 Monoplegia of upper limb affecting right dominant side: Secondary | ICD-10-CM | POA: Insufficient documentation

## 2019-12-20 DIAGNOSIS — F419 Anxiety disorder, unspecified: Secondary | ICD-10-CM | POA: Insufficient documentation

## 2019-12-20 DIAGNOSIS — F329 Major depressive disorder, single episode, unspecified: Secondary | ICD-10-CM | POA: Diagnosis not present

## 2019-12-20 DIAGNOSIS — R450 Nervousness: Secondary | ICD-10-CM | POA: Insufficient documentation

## 2019-12-20 DIAGNOSIS — F411 Generalized anxiety disorder: Secondary | ICD-10-CM

## 2019-12-20 MED ORDER — HYDROXYZINE HCL 25 MG PO TABS: 25.0000 mg | ORAL_TABLET | Freq: Three times a day (TID) | ORAL | 0 refills | Status: DC | PRN

## 2019-12-20 NOTE — ED Provider Notes (Signed)
Behavioral Health Medical Screening Exam  Jeffery Baldwin is a 29 y.o. male who presents to [facility] with his father. He reports that he has been feeling anxious and had a panic attack today. He denies suicidal thoughts, homicidal thoughts, and auditory and visual hallucinations.   Total Time spent with patient: 20 minutes  Psychiatric Specialty Exam  Presentation  General Appearance:Appropriate for Environment;Well Groomed  Eye Contact:Good  Speech:Clear and Coherent;Normal Rate  Speech Volume:Normal  Handedness:No data recorded  Mood and Affect  Mood:Anxious  Affect:Congruent;Appropriate   Thought Process  Thought Processes:Coherent;Goal Directed;Linear  Descriptions of Associations:Intact  Orientation:Full (Time, Place and Person)  Thought Content:WDL  Hallucinations:None  Ideas of Reference:None  Suicidal Thoughts:No  Homicidal Thoughts:No   Sensorium  Memory:Immediate Good;Recent Good;Remote Good  Judgment:Good  Insight:Fair   Executive Functions  Concentration:Fair  Attention Span:Fair  Recall:Good  Fund of Knowledge:Good  Language:Good   Psychomotor Activity  Psychomotor Activity:Normal   Assets  Assets:Communication Skills;Desire for Improvement;Housing;Leisure Time   Sleep  Sleep:Fair  Number of hours: No data recorded  Physical Exam: Physical Exam Constitutional:      General: He is not in acute distress.    Appearance: He is not ill-appearing, toxic-appearing or diaphoretic.  HENT:     Head: Normocephalic.  Eyes:     Pupils: Pupils are equal, round, and reactive to light.  Cardiovascular:     Rate and Rhythm: Normal rate.  Pulmonary:     Effort: Pulmonary effort is normal. No respiratory distress.  Musculoskeletal:     Comments: paralyzed right arm from a football injury  Neurological:     Mental Status: He is alert.    Review of Systems  Constitutional: Negative for chills, diaphoresis, fever, malaise/fatigue and  weight loss.  Respiratory: Negative for cough and shortness of breath.   Cardiovascular: Negative for chest pain and palpitations.  Gastrointestinal: Negative for diarrhea, nausea and vomiting.  Neurological: Negative for seizures.  Psychiatric/Behavioral: Positive for depression. Negative for hallucinations, memory loss, substance abuse and suicidal ideas. The patient is nervous/anxious and has insomnia.    Blood pressure 135/80, pulse 93, temperature 98 F (36.7 C), temperature source Tympanic, resp. rate 18, height 5\' 11"  (1.803 m), weight 165 lb (74.8 kg), SpO2 98 %. Body mass index is 23.01 kg/m.  Musculoskeletal: Strength & Muscle Tone: within normal limits Gait & Station: normal Patient leans: N/A   Recommendations:  Based on my evaluation the patient does not appear to have an emergency medical condition.   Disposition: No evidence of imminent risk to self or others at present.   Patient does not meet criteria for psychiatric inpatient admission. Supportive therapy provided about ongoing stressors. Discussed crisis plan, support from social network, calling 911, coming to the Emergency Department, and calling Suicide Hotline. Provided prescription for hydroxyzine 25 mg #30 TID prn for anxiety   , NP 12/20/2019, 8:42 PM

## 2019-12-20 NOTE — BH Assessment (Addendum)
Comprehensive Clinical Assessment (CCA) Note  12/20/2019 Jeffery Baldwin [id number] Pt has been having heightened anxiety for the last 6 months.  He is currently living with his paternal aunt in [place], [place].  Patient says that every day is a repeat of the last.  He feels like he is in the "Groundhog Day" movie.  Patient says that he is having some panic attacks.  Patient says that he was in jail for two years and has been out for a year.  His mother died while he was in jail and he feels guilty about that.  Patient says he has been very depressed & anxious lately.   Patient denies any HI, SI or A/V hallucinations.  Pt says he was very anxious and had a rapid heartbeat for close to an hour.  Pt reports that it was making him dizzy and nauseated.  Pt took a clonipin that was offered to him.  Pt says he did drink about 6-8 beers yesterday evening but says that he usually does not drink.  He denies use of other substances.  Pt has a depressed affect.  He has good eye contact and is oriented x5.  He is not responding to internal stimuli.  Patient is not engaged in delusional thinking.  Pt reports that he sleeps okay. Appetite is normal.  Pt has no previous inpatient care experience and he has no current outpatient providers.  He is interested in getting a outpatient provider.    -Clinician discussed patient care with Nira Conn, FNP.  He recommended outpatient services at [facility].  An appointment was made for patient for 07/20 at 13:00.  Pt left the building with his father.    Visit Diagnosis:   No diagnosis found.    CCA Screening, Triage and Referral (STR)  Patient Reported Information How did you hear about [place]? Family/Friend (Father brought him to [facility].)  Referral name: No data recorded Referral phone number: No data recorded  Whom do you see for routine medical problems? I don't have a doctor  Practice/Facility Name: No data recorded Practice/Facility Phone Number: No data  recorded Name of Contact: No data recorded Contact Number: No data recorded Contact Fax Number: No data recorded Prescriber Name: No data recorded Prescriber Address (if known): No data recorded  What Is the Reason for Your Visit/Call Today? Anxiety, cannot focus.  "feel like I have super ADHD."  How Long Has This Been Causing You Problems? > than 6 months  What Do You Feel Would Help You the Most Today? Assessment Only   Have You Recently Been in Any Inpatient Treatment (Hospital/Detox/Crisis Center/28-Day Program)? No  Name/Location of Program/Hospital:No data recorded How Long Were You There? No data recorded When Were You Discharged? No data recorded  Have You Ever Received Services From [facility] Before? No  Who Do You See at [facility]? No data recorded  Have You Recently Had Any Thoughts About Hurting Yourself? No  Are You Planning to Commit Suicide/Harm Yourself At This time? No   Have you Recently Had Thoughts About Hurting Someone [name]? No  Explanation: No data recorded  Have You Used Any Alcohol or Drugs in the Past 24 Hours? Yes  How Long Ago Did You Use Drugs or Alcohol? 1500  What Did You Use and How Much? Used a clonopin abour 4 hours ago.  Drank about 6-8 beers yesterday.   Do You Currently Have a Therapist/Psychiatrist? No  Name of Therapist/Psychiatrist: None   Have You Been Recently Discharged  From Any Public relations account executiveffice Practice or Programs? No  Explanation of Discharge From Practice/Program: No data recorded    CCA Screening Triage Referral Assessment Type of Contact: Face-to-Face  Is this Initial or Reassessment? No data recorded Date Telepsych consult ordered in [facility]:  No data recorded Time Telepsych consult ordered in [facility]:  No data recorded  Patient Reported Information Reviewed? Yes  Patient Left Without Being Seen? No data recorded Reason for Not Completing Assessment: No data recorded  Collateral Involvement: No data recorded  Does  Patient Have a Court Appointed Legal Guardian? No data recorded Name and Contact of Legal Guardian: No data recorded If Minor and Not Living with Parent(s), Who has Custody? No data recorded Is CPS involved or ever been involved? No data recorded Is APS involved or ever been involved? No data recorded  Patient Determined To Be At Risk for Harm To Self or Others Based on Review of Patient Reported Information or Presenting Complaint? No  Method: No data recorded Availability of Means: No data recorded Intent: No data recorded Notification Required: No data recorded Additional Information for Danger to Others Potential: No data recorded Additional Comments for Danger to Others Potential: No data recorded Are There Guns or Other Weapons in Your Home? No data recorded Types of Guns/Weapons: No data recorded Are These Weapons Safely Secured?                            No data recorded Who Could Verify You Are Able To Have These Secured: No data recorded Do You Have any Outstanding Charges, Pending Court Dates, Parole/Probation? No data recorded Contacted To Inform of Risk of Harm To Self or Others: No data recorded  [facility]   Does Patient Present under Involuntary Commitment? No  IVC Papers Initial File Date: No data recorded  [place]   Patient Currently Receiving the Following Services: Not Receiving Services   Determination of Need: Emergent (2 hours)   Options For Referral: Outpatient Therapy     CCA Biopsychosocial  Intake/Chief Complaint:  CCA Intake With Chief Complaint CCA Part Two Date: 12/20/19 CCA Part Two Time: 1939 Chief Complaint/Presenting Problem: Pt feels that he is doing the same thing over and over every day. Patient's Currently Reported Symptoms/Problems: Increased panic attacks Initial Clinical Notes/Concerns: Pt denies any HI, SI or A/V hallucinations.  Feels that his anxiety is getting  out of hand.  Mental Health Symptoms Depression:  Depression: Difficulty Concentrating, Duration of symptoms greater than two weeks, Worthlessness, Irritability  Mania:  Mania: Racing thoughts  Anxiety:   Anxiety: Restlessness, Tension, Difficulty concentrating  Psychosis:  Psychosis: None  Trauma:     Obsessions:     Compulsions:     Inattention:  Inattention: Disorganized, Forgetful, Symptoms present in 2 or more settings  Hyperactivity/Impulsivity:     Oppositional/Defiant Behaviors:     Emotional Irregularity:     Other Mood/Personality Symptoms:      Mental Status Exam Appearance and self-care  Stature:  Stature: Average  Weight:     Clothing:     Grooming:     Cosmetic use:     Posture/gait:     Motor activity:     Sensorium  Attention:     Concentration:     Orientation:  Orientation: X5  Recall/memory:  Recall/Memory: Defective in Short-term  Affect and Mood  Affect:  Affect: Anxious, Depressed  Mood:  Mood: Anxious,  Depressed  Relating  Eye contact:  Eye Contact: Normal  Facial expression:  Facial Expression: Depressed  Attitude toward examiner:  Attitude Toward Examiner: Cooperative  Thought and Language  Speech flow: Speech Flow: Normal  Thought content:  Thought Content: Appropriate to Mood and Circumstances  Preoccupation:  Preoccupations: None  Hallucinations:  Hallucinations: None  Organization:     Company secretary of Knowledge:  Fund of Knowledge: Average  Intelligence:  Intelligence: Average  Abstraction:  Abstraction: Curator, Normal  Judgement:  Judgement: Normal  Reality Testing:  Reality Testing: Realistic  Insight:  Insight: Fair  Decision Making:  Decision Making: Normal  Social Functioning  Social Maturity:  Social Maturity: Impulsive  Social Judgement:  Social Judgement: Normal  Stress  Stressors:  Stressors: Family conflict  Coping Ability:  Coping Ability: Building surveyor Deficits:  Skill Deficits: None  Supports:   Supports: Family (Father and aunt)     Religion:    Leisure/Recreation:    Exercise/Diet: Exercise/Diet Have You Gained or Lost A Significant Amount of Weight in the Past Six Months?: No Do You Follow a Special Diet?: No Do You Have Any Trouble Sleeping?: No   CCA Employment/Education  Employment/Work Situation: Employment / Work Situation Employment situation: Unemployed (Applying for disability.)  Education: Education Is Patient Currently Attending School?: No   CCA Family/Childhood History  Family and Relationship History: Family history Marital status: Single  Childhood History:     Child/Adolescent Assessment:     CCA Substance Use  Alcohol/Drug Use: Alcohol / Drug Use Pain Medications: None Prescriptions: None Over the Counter: None History of alcohol / drug use?: Yes Substance #1 Name of Substance 1: ETOH 1 - Age of First Use: Unknown 1 - Amount (size/oz): Varies 1 - Frequency: <once every two weeks 1 - Duration: off and on 1 - Last Use / Amount: 07/16  Drank 6-8 beers                       ASAM's:  Six Dimensions of Multidimensional Assessment  Dimension 1:  Acute Intoxication and/or Withdrawal Potential:      Dimension 2:  Biomedical Conditions and Complications:      Dimension 3:  Emotional, Behavioral, or Cognitive Conditions and Complications:     Dimension 4:  Readiness to Change:     Dimension 5:  Relapse, Continued use, or Continued Problem Potential:     Dimension 6:  Recovery/Living Environment:     ASAM Severity Score:    ASAM Recommended Level of Treatment:     Substance use Disorder (SUD)    Recommendations for Services/Supports/Treatments:    DSM5 Diagnoses: Patient Active Problem List   Diagnosis Date Noted   Paralysis of right upper extremity (HCC)    Chest pain    IVDU (intravenous drug user)    Fever 11/13/2014   Encounter for chronic pain management 06/29/2014   Generalized anxiety disorder  05/23/2014   Cellulitis and abscess of hand    Essential hypertension, benign    Cellulitis of right hand 04/27/2014   Cellulitis of right upper extremity 04/27/2014   Gastroenteritis 04/10/2014   Right arm pain 04/10/2014   Anisocoria 01/19/2014   Left leg injury 12/31/2013   Left hand tendonitis 09/09/2013   Counseling and coordination of care 05/12/2013   Closed fracture of unspecified part of upper end of humerus 01/29/2013   Obesity 05/17/2012   Elevated BP 02/16/2012   Chronic pain syndrome 02/16/2012   Social anxiety  disorder 08/25/2011   Former smoker 09/13/2009   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

## 2019-12-20 NOTE — ED Notes (Signed)
Pt. Belongings received and stored in storage #25  sneakers and chain.(neck chain)

## 2019-12-23 ENCOUNTER — Ambulatory Visit (HOSPITAL_COMMUNITY): Payer: Self-pay | Admitting: Psychiatric/Mental Health

## 2019-12-25 ENCOUNTER — Ambulatory Visit (HOSPITAL_COMMUNITY): Payer: Self-pay | Admitting: Psychiatry

## 2020-01-15 ENCOUNTER — Ambulatory Visit (HOSPITAL_COMMUNITY): Payer: No Payment, Other | Admitting: Licensed Clinical Social Worker

## 2020-01-21 ENCOUNTER — Encounter (HOSPITAL_COMMUNITY): Payer: Self-pay | Admitting: Psychiatry

## 2020-01-21 ENCOUNTER — Telehealth (INDEPENDENT_AMBULATORY_CARE_PROVIDER_SITE_OTHER): Payer: No Payment, Other | Admitting: Psychiatry

## 2020-01-21 ENCOUNTER — Other Ambulatory Visit: Payer: Self-pay

## 2020-01-21 ENCOUNTER — Telehealth (HOSPITAL_COMMUNITY): Payer: No Payment, Other | Admitting: Psychiatry

## 2020-01-21 DIAGNOSIS — F900 Attention-deficit hyperactivity disorder, predominantly inattentive type: Secondary | ICD-10-CM | POA: Insufficient documentation

## 2020-01-21 DIAGNOSIS — F411 Generalized anxiety disorder: Secondary | ICD-10-CM | POA: Diagnosis not present

## 2020-01-21 DIAGNOSIS — F330 Major depressive disorder, recurrent, mild: Secondary | ICD-10-CM | POA: Diagnosis not present

## 2020-01-21 MED ORDER — ATOMOXETINE HCL 40 MG PO CAPS: 40.0000 mg | ORAL_CAPSULE | Freq: Every day | ORAL | 2 refills | Status: DC

## 2020-01-21 MED ORDER — BUSPIRONE HCL 10 MG PO TABS: 10.0000 mg | ORAL_TABLET | Freq: Every day | ORAL | 2 refills | Status: DC

## 2020-01-21 NOTE — Progress Notes (Signed)
Psychiatric Initial Adult Assessment  Virtual Visit via Telephone Note  I connected with Jeffery Baldwin on 01/21/20 at  4:00 PM EDT by telephone and verified that I am speaking with the correct person using two identifiers.  Location: Patient: home Provider: Clinic   I discussed the limitations, risks, security and privacy concerns of performing an evaluation and management service by telephone and the availability of in person appointments. I also discussed with the patient that there may be a patient responsible charge related to this service. The patient expressed understanding and agreed to proceed.   I provided 45 minutes of non-face-to-face time during this encounter.    Patient Identification: Jeffery Baldwin MRN:  [id number] Date of Evaluation:  01/21/2020 Referral Source: [facility] Chief Complaint:  " Antidepressants make me more depressed I just want something for my anxiety and concentration" Visit Diagnosis:    ICD-10-CM   1. Attention deficit hyperactivity disorder (ADHD), predominantly inattentive type  F90.0 atomoxetine (STRATTERA) 40 MG capsule  2. Generalized anxiety disorder  F41.1 busPIRone (BUSPAR) 10 MG tablet  3. Mild episode of recurrent major depressive disorder (HCC)  F33.0 busPIRone (BUSPAR) 10 MG tablet    History of Present Illness: 29 year old male seen today for initial psychiatric evaluation.  Patient was unable to log on virtually so provider spoke with patient on the telephone.  He was referred to outpatient psychiatry by [facility] where he was seen on 12/20/2019 presenting with symptoms of anxiety and panic disorder.  He is currently being managed on hydroxyzine 25 mg 3 times a day.  He notes that this medication was ineffective and reports that he discontinued it about a week after starting it.  Today he endorses symptoms of depression such as depressed mood, feelings of guilt, difficulty concentrating, hopelessness, impaired memory, and anxiety.  At times he notes  that he is distractible and has racing thoughts.  He notes that he dislikes antidepressant medication because it causes him to be more depressed.  He notes in the past that he has tried Wellbutrin.  Provider asked patient if he was open to trying another antidepressant and he notes that he was not.  He reports that his anxiety is more bothersome.  He informed Clinical research associate that he would like medications to help treat his anxiety only.  Provider informed patient of BuSpar.  He also agreeable to starting medication.  Patient also notes that he has been having problems concentrating for the last 8 months.  He notes that he has difficulty completing tasks, paying attention to details, unable to listen when spoken to directly, problems following through on instruction, has problems organizing materials, misplaces items for task, and was also forgetful.  Patient informed provider that he would like medications to assist with concentration.  He notes that in the past he was on Ritalin however notes that he disliked how it made him feel.  Provider informed patient on Strattera.  He was agreeable to starting medication.  Patient informed provider that he has a lot of life stressors.  Provider asked patient to elaborate and he notes that it is hard to elaborate on what is currently going on.  He did Archivist that his mother died in April 20, 2020and his grandfather recently passed and may of September 23, 2019.  He informed Clinical research associate that he has one child however notes that he he does not provide for them.  Patient is agreeable to starting Strattera 40 mg daily to help improve concentration.  He is also agreeable to  starting BuSpar 10 mg 3 times daily to help manage symptoms of anxiety.Potential side effects of medication and risks vs benefits of treatment vs non-treatment were explained and discussed. All questions were answered.  No other concerns noted at this time.   Associated Signs/Symptoms: Depression Symptoms:  depressed  mood, feelings of worthlessness/guilt, difficulty concentrating, hopelessness, impaired memory, anxiety, (Hypo) Manic Symptoms:  Distractibility, Flight of Ideas, Anxiety Symptoms:  Excessive Worry, Psychotic Symptoms:  Denies PTSD Symptoms: Had a traumatic exposure:  Notes mother died 2018/10/07 and Grandfather died in 2019-11-06  Past Psychiatric History: Social anxiety, substance use, and generalized anxiety. Patient notes that he was on Ritalin in the past however notes that he does not remmber having a diagnosis of ADD or ADHD Previous Psychotropic Medications: Notes tried hydroxyzine, Ritalin, and wellbutrin.   Substance Abuse History in the last 12 months:  No.  Consequences of Substance Abuse: NA  Past Medical History:  Past Medical History:  Diagnosis Date  . Chronic pain syndrome   . Heroin abuse (HCC)   . Nerve root avulsion   . Spinal cord injury at C5-C7 level without injury of spinal bone (HCC) 10/06/09    Past Surgical History:  Procedure Laterality Date  . nerve root repair      Family Psychiatric History: Denies  Family History: History reviewed. No pertinent family history.  Social History:   Social History   Socioeconomic History  . Marital status: Single    Spouse name: Not on file  . Number of children: Not on file  . Years of education: Not on file  . Highest education level: Not on file  Occupational History  . Not on file  Tobacco Use  . Smoking status: Current Some Day Smoker    Packs/day: 0.20    Years: 7.00    Pack years: 1.40    Types: Cigarettes  . Smokeless tobacco: Never Used  . Tobacco comment: decreased smoking  Substance and Sexual Activity  . Alcohol use: Yes    Alcohol/week: 0.0 standard drinks    Comment: occasional  . Drug use: Yes    Comment: heroine  . Sexual activity: Yes  Other Topics Concern  . Not on file  Social History Narrative   ** Merged History Encounter **       Social Determinants of Health    Financial Resource Strain:   . Difficulty of Paying Living Expenses:   Food Insecurity:   . Worried About Programme researcher, broadcasting/film/video in the Last Year:   . Barista in the Last Year:   Transportation Needs:   . Freight forwarder (Medical):   Marland Kitchen Lack of Transportation (Non-Medical):   Physical Activity:   . Days of Exercise per Week:   . Minutes of Exercise per Session:   Stress:   . Feeling of Stress :   Social Connections:   . Frequency of Communication with Friends and Family:   . Frequency of Social Gatherings with Friends and Family:   . Attends Religious Services:   . Active Member of Clubs or Organizations:   . Attends Banker Meetings:   Marland Kitchen Marital Status:     Additional Social History: Patient lives in [place] with his family member. He has one child. He has denies alcohol use and illicit drug use. He endorses smoking half a pack a day. He is currently is unemployed.  Allergies:   Allergies  Allergen Reactions  . Morphine And  Related Other (See Comments)    Emesis  . Ms Contin Rivka Safer[Morphine Sulfate] Nausea And Vomiting    Metabolic Disorder Labs: No results found for: HGBA1C, MPG No results found for: PROLACTIN No results found for: CHOL, TRIG, HDL, CHOLHDL, VLDL, LDLCALC No results found for: TSH  Therapeutic Level Labs: No results found for: LITHIUM No results found for: CBMZ No results found for: VALPROATE  Current Medications: Current Outpatient Medications  Medication Sig Dispense Refill  . atomoxetine (STRATTERA) 40 MG capsule Take 1 capsule (40 mg total) by mouth daily. 30 capsule 2  . busPIRone (BUSPAR) 10 MG tablet Take 1 tablet (10 mg total) by mouth daily. 30 tablet 2  . hydrOXYzine (ATARAX/VISTARIL) 25 MG tablet Take 1 tablet (25 mg total) by mouth 3 (three) times daily as needed. 30 tablet 0  . Oxycodone HCl 20 MG TABS 1/2 tab bid 10 tablet 0   No current facility-administered medications for this visit.     Musculoskeletal: Strength & Muscle Tone: Unable to assess patient unable to log in virtually.  Gait & Station: Unable to assess patient unable to log in virtually.  Patient leans: N/A  Psychiatric Specialty Exam: Review of Systems  There were no vitals taken for this visit.There is no height or weight on file to calculate BMI.  General Appearance: Unable to assess patient unable to log in virtually.   Eye Contact:  Unable to assess. Patinet not able to log in virtually.   Speech:  Clear and Coherent and Normal Rate  Volume:  Normal  Mood:  Anxious  Affect:  Congruent  Thought Process:  Coherent, Goal Directed and Linear  Orientation:  Full (Time, Place, and Person)  Thought Content:  WDL and Logical  Suicidal Thoughts:  No  Homicidal Thoughts:  No  Memory:  Immediate;   Good Recent;   Good Remote;   Good  Judgement:  Good  Insight:  Good  Psychomotor Activity:  Unable to assess patient unable to log in virtually.   Concentration:  Concentration: Good and Attention Span: Good  Recall:  Good  Fund of Knowledge:Good  Language: Good  Akathisia:  No  Handed:  Left  AIMS (if indicated):  Not done  Assets:  Communication Skills Desire for Improvement Housing Social Support  ADL's:  Intact  Cognition: WNL  Sleep:  Good   Screenings: PHQ2-9     [facility]  PHQ-2 Total Score 4 0 0 0 0  PHQ-9 Total Score 12 -- -- -- --      Assessment and Plan: Patient endorses symptoms of anxiety and depression.  At this time he notes that he does not want to try a antidepressant because in the past it made him more depressed.  He also reports that he dislikes hydroxyzine and is  agreeable to discontinue it.  Patient is agreeable to starting BuSpar 10 mg at night to help with symptoms of anxiety and depression.  Patient also endorsed poor concentration and is agreeable to starting Strattera 40 mg to help manage symptoms.  1. Attention deficit hyperactivity disorder (ADHD), predominantly inattentive type  Start- atomoxetine (STRATTERA) 40 MG capsule; Take 1 capsule (40 mg total) by mouth daily.  Dispense: 30 capsule; Refill: 2  2. Generalized anxiety disorder  Start- busPIRone (BUSPAR) 10 MG tablet; Take 1 tablet (10 mg total) by mouth daily.  Dispense: 30 tablet; Refill: 2  3. Mild episode of recurrent major depressive disorder (HCC)  Start- busPIRone (BUSPAR) 10 MG tablet; Take 1 tablet (10 mg total) by mouth daily.  Dispense: 30 tablet; Refill: 2    Shanna Cisco, NP 8/18/20215:42 PM

## 2020-02-27 ENCOUNTER — Encounter (HOSPITAL_COMMUNITY): Payer: Self-pay | Admitting: Psychiatry

## 2020-02-27 ENCOUNTER — Telehealth (INDEPENDENT_AMBULATORY_CARE_PROVIDER_SITE_OTHER): Payer: No Payment, Other | Admitting: Psychiatry

## 2020-02-27 ENCOUNTER — Other Ambulatory Visit: Payer: Self-pay

## 2020-02-27 DIAGNOSIS — F900 Attention-deficit hyperactivity disorder, predominantly inattentive type: Secondary | ICD-10-CM

## 2020-02-27 DIAGNOSIS — F411 Generalized anxiety disorder: Secondary | ICD-10-CM

## 2020-02-27 DIAGNOSIS — F330 Major depressive disorder, recurrent, mild: Secondary | ICD-10-CM | POA: Diagnosis not present

## 2020-02-27 MED ORDER — ATOMOXETINE HCL 40 MG PO CAPS: 40.0000 mg | ORAL_CAPSULE | Freq: Every day | ORAL | 2 refills | Status: DC

## 2020-02-27 MED ORDER — BUSPIRONE HCL 10 MG PO TABS: 10.0000 mg | ORAL_TABLET | Freq: Every day | ORAL | 2 refills | Status: DC

## 2020-02-27 MED ORDER — HYDROXYZINE HCL 25 MG PO TABS: 25.0000 mg | ORAL_TABLET | Freq: Three times a day (TID) | ORAL | 2 refills | Status: DC | PRN

## 2020-02-27 NOTE — Progress Notes (Signed)
BH MD/PA/NP OP Progress Note Virtual Visit via Telephone Note  I connected with Jeffery Baldwin on 02/27/20 at 10:00 AM EDT by telephone and verified that I am speaking with the correct person using two identifiers.  Location: Patient: home Provider: Clinic   I discussed the limitations, risks, security and privacy concerns of performing an evaluation and management service by telephone and the availability of in person appointments. I also discussed with the patient that there may be a patient responsible charge related to this service. The patient expressed understanding and agreed to proceed.   I provided 30 minutes of non-face-to-face time during this encounter.    02/27/2020 10:18 AM Jeffery Baldwin  MRN:  [id number]  Chief Complaint: "I never picked up my medications"   HPI:  29 year old male seen today for follow up psychiatric evaluation.  Patient was unable to log on virtually so provider spoke with patient on the telephone. He has a psychiatric history of Social anxiety, substance use, and generalized anxiety. Patient notes that he was on Ritalin in the past however notes that he does not remmber having a diagnosis of ADD or ADHD.  He is currently being managed on hydroxyzine 25 mg 3 times a day, Buspar 10 mg daily, and Strattera 40 mg daily.  He notes that since last visit he did not pick up his medications.  Today he is pleasant, calm, cooperative, and engaged in conversation. He informed Clinical research associate that he spent sometime with his grandmother in [place] and is now less depressed and anxious. He notes however that he would still like to receive medications to help manage his psychiatric conditions. Writer informed resident that his medications would be refilled and sent to preferred pharmacy. He denies SI/HI/VAH or paranoia.   Patient also notes that he has still been having problems concentrating and notes he never started his Strattera.  Patient is agreeable to starting Strattera  40 mg daily to help improve concentration.  He is also agreeable to starting BuSpar 10 mg 3 times daily to help manage symptoms of anxiety.Potential side effects of medication and risks vs benefits of treatment vs non-treatment were explained and discussed. All questions were answered.  No other concerns noted at this time. Visit Diagnosis:    ICD-10-CM   1. Attention deficit hyperactivity disorder (ADHD), predominantly inattentive type  F90.0 atomoxetine (STRATTERA) 40 MG capsule  2. Generalized anxiety disorder  F41.1 busPIRone (BUSPAR) 10 MG tablet    hydrOXYzine (ATARAX/VISTARIL) 25 MG tablet  3. Mild episode of recurrent major depressive disorder (HCC)  F33.0 busPIRone (BUSPAR) 10 MG tablet    Past Psychiatric History: Social anxiety, substance use, and generalized anxiety. Patient notes that he was on Ritalin in the past however notes that he does not remmber having a diagnosis of ADD or ADHD  Past Medical History:  Past Medical History:  Diagnosis Date  . Chronic pain syndrome   . Heroin abuse (HCC)   . Nerve root avulsion   . Spinal cord injury at C5-C7 level without injury of spinal bone (HCC) 2011    Past Surgical History:  Procedure Laterality Date  . nerve root repair      Family Psychiatric History: Denies  Family History: No family history on file.  Social History:  Social History   Socioeconomic History  . Marital status: Single    Spouse name: Not on file  . Number of children: Not on file  . Years of education: Not on file  . Highest education level:  Not on file  Occupational History  . Not on file  Tobacco Use  . Smoking status: Current Some Day Smoker    Packs/day: 0.20    Years: 7.00    Pack years: 1.40    Types: Cigarettes  . Smokeless tobacco: Never Used  . Tobacco comment: decreased smoking  Substance and Sexual Activity  . Alcohol use: Yes    Alcohol/week: 0.0 standard drinks    Comment: occasional  . Drug use: Yes    Comment: heroine  .  Sexual activity: Yes  Other Topics Concern  . Not on file  Social History Narrative   ** Merged History Encounter **       Social Determinants of Health   Financial Resource Strain:   . Difficulty of Paying Living Expenses: Not on file  Food Insecurity:   . Worried About Programme researcher, broadcasting/film/video in the Last Year: Not on file  . Ran Out of Food in the Last Year: Not on file  Transportation Needs:   . Lack of Transportation (Medical): Not on file  . Lack of Transportation (Non-Medical): Not on file  Physical Activity:   . Days of Exercise per Week: Not on file  . Minutes of Exercise per Session: Not on file  Stress:   . Feeling of Stress : Not on file  Social Connections:   . Frequency of Communication with Friends and Family: Not on file  . Frequency of Social Gatherings with Friends and Family: Not on file  . Attends Religious Services: Not on file  . Active Member of Clubs or Organizations: Not on file  . Attends Banker Meetings: Not on file  . Marital Status: Not on file    Allergies:  Allergies  Allergen Reactions  . Morphine And Related Other (See Comments)    Emesis  . Ms Contin Rivka Safer Sulfate] Nausea And Vomiting    Metabolic Disorder Labs: No results found for: HGBA1C, MPG No results found for: PROLACTIN No results found for: CHOL, TRIG, HDL, CHOLHDL, VLDL, LDLCALC No results found for: TSH  Therapeutic Level Labs: No results found for: LITHIUM No results found for: VALPROATE No components found for:  CBMZ  Current Medications: Current Outpatient Medications  Medication Sig Dispense Refill  . atomoxetine (STRATTERA) 40 MG capsule Take 1 capsule (40 mg total) by mouth daily. 30 capsule 2  . busPIRone (BUSPAR) 10 MG tablet Take 1 tablet (10 mg total) by mouth daily. 30 tablet 2  . hydrOXYzine (ATARAX/VISTARIL) 25 MG tablet Take 1 tablet (25 mg total) by mouth 3 (three) times daily as needed. 30 tablet 2  . Oxycodone HCl 20 MG TABS 1/2 tab bid  10 tablet 0   No current facility-administered medications for this visit.     Musculoskeletal: Strength & Muscle Tone: Unable to asess due to telephone visit. Patient unable to login virtually Gait & Station: Unable to asess due to telephone visit. Patient unable to login virtually Patient leans: N/A  Psychiatric Specialty Exam: Review of Systems  There were no vitals taken for this visit.There is no height or weight on file to calculate BMI.  General Appearance: Unable to asess due to telephone visit. Patient unable to login virtually  Eye Contact:  Unable to asess due to telephone visit. Patient unable to login virtually  Speech:  Clear and Coherent and Normal Rate  Volume:  Normal  Mood:  Euthymic  Affect:  Appropriate and Congruent  Thought Process:  Coherent, Goal Directed and Linear  Orientation:  Full (Time, Place, and Person)  Thought Content: WDL and Logical   Suicidal Thoughts:  No  Homicidal Thoughts:  No  Memory:  Immediate;   Good Recent;   Good Remote;   Good  Judgement:  Good  Insight:  Good  Psychomotor Activity:  Normal  Concentration:  Concentration: Fair and Attention Span: Fair  Recall:  Good  Fund of Knowledge: Good  Language: Good  Akathisia:  No  Handed:  Left  AIMS (if indicated): Not done  Assets:  Communication Skills Desire for Improvement Financial Resources/Insurance Housing Social Support  ADL's:  Intact  Cognition: WNL  Sleep:  Good   Screenings: PHQ2-9     ED from 12/20/2019 in [facility] Office Visit from 08/10/2014 in [facility] Office Visit from 07/09/2014 in [facility] Office Visit from 07/02/2014 in [facility] Office Visit from 06/29/2014 in [facility]  PHQ-2 Total Score 4 0 0 0 0  PHQ-9 Total Score 12 -- -- -- --       Assessment and Plan: Patient notes that his anxiety and depression has improved since last  visit. He however notes he has problems concentrating. He informed Clinical research associate that he never picked up his medications. He is agreeable to to starting BuSpar 10 mg three times daily help with symptoms of anxiety. He is also agreeable to start  Strattera 40 mg to help manage symptoms of ADHD.  1. Attention deficit hyperactivity disorder (ADHD), predominantly inattentive type  Start- atomoxetine (STRATTERA) 40 MG capsule; Take 1 capsule (40 mg total) by mouth daily.  Dispense: 30 capsule; Refill: 2  2. Generalized anxiety disorder  Start- busPIRone (BUSPAR) 10 MG tablet; Take 1 tablet (10 mg total) by mouth daily.  Dispense: 30 tablet; Refill: 2  3. Mild episode of recurrent major depressive disorder (HCC)  Start- busPIRone (BUSPAR) 10 MG tablet; Take 1 tablet (10 mg total) by mouth daily.  Dispense: 30 tablet; Refill: 2     Shanna Cisco, NP 02/27/2020, 10:18 AM

## 2020-03-10 ENCOUNTER — Ambulatory Visit (HOSPITAL_COMMUNITY): Payer: No Payment, Other | Admitting: Licensed Clinical Social Worker

## 2020-05-31 ENCOUNTER — Telehealth (HOSPITAL_COMMUNITY): Payer: No Payment, Other | Admitting: Psychiatry

## 2020-06-02 ENCOUNTER — Other Ambulatory Visit: Payer: Self-pay

## 2020-06-02 ENCOUNTER — Telehealth (HOSPITAL_COMMUNITY): Payer: No Payment, Other | Admitting: Psychiatry

## 2020-11-15 ENCOUNTER — Encounter (HOSPITAL_COMMUNITY): Payer: Self-pay | Admitting: Registered Nurse

## 2020-11-15 ENCOUNTER — Other Ambulatory Visit: Payer: Self-pay

## 2020-11-15 ENCOUNTER — Ambulatory Visit (HOSPITAL_COMMUNITY)
Admission: EM | Admit: 2020-11-15 | Discharge: 2020-11-15 | Disposition: A | Discharge status: Home / Self Care | Payer: No Payment, Other | Attending: Registered Nurse | Admitting: Registered Nurse

## 2020-11-15 DIAGNOSIS — F22 Delusional disorders: Secondary | ICD-10-CM | POA: Insufficient documentation

## 2020-11-15 DIAGNOSIS — F411 Generalized anxiety disorder: Secondary | ICD-10-CM | POA: Diagnosis present

## 2020-11-15 DIAGNOSIS — F4010 Social phobia, unspecified: Secondary | ICD-10-CM | POA: Insufficient documentation

## 2020-11-15 DIAGNOSIS — F330 Major depressive disorder, recurrent, mild: Secondary | ICD-10-CM | POA: Insufficient documentation

## 2020-11-15 DIAGNOSIS — F900 Attention-deficit hyperactivity disorder, predominantly inattentive type: Secondary | ICD-10-CM | POA: Insufficient documentation

## 2020-11-15 NOTE — ED Provider Notes (Signed)
Behavioral Health Urgent Care Medical Screening Exam  Patient Name: Jeffery Baldwin MRN: [id number] Date of Evaluation: 11/15/20 Chief Complaint:   Diagnosis:  Final diagnoses:  Paranoia (HCC)  Attention deficit hyperactivity disorder (ADHD), predominantly inattentive type  Mild episode of recurrent major depressive disorder (HCC)  Social anxiety disorder    History of Present illness: Jeffery Baldwin is a 30 y.o. male. patient presented to [facility] as a walk in accompanied by his father with complaints of paranoia and seeking outpatient services  ?Cherylann Ratel, 30 y.o., male patient seen face to face by this provider, consulted with Dr. Nelly Rout; and chart reviewed on ?11/15/20.  On evaluation ?Jeffery Baldwin reports he had outpatient psychiatric services with [facility] but did not continue and did not fill medications "I was in [place] at the time and really didn't have a way."  Patient states he feels a little paranoid but doesn's know if it is warranted.  Patient father states that patient feels that phones are tapped, bank accounts have been hacked and named off seral other things that patient has been taking about.  States that patient hasn't been a danger to himself or others but paranoia is getting worse and patient is becoming agitated when told things are not true.  Patient is seeking outpatient psychiatric services for medication management and therapy.   During evaluation Jeffery Baldwin is sitting up right in chair in no acute distress.  He is alert, oriented x 4, calm and cooperative.  His mood is anxious and euthymic with congruent affect.  He does not appear to be responding to internal/external stimuli or delusional thoughts.  Patient denies suicidal/self-harm/homicidal ideation, psychosis, and paranoia.  Patient answered question appropriately.       Psychiatric Specialty Exam  Presentation  General Appearance:Appropriate for Environment; Casual  Eye  Contact:Good  Speech:Clear and Coherent; Normal Rate  Speech Volume:Normal  Handedness:Right   Mood and Affect  Mood:Anxious  Affect:Appropriate; Congruent   Thought Process  Thought Processes:Coherent; Goal Directed  Descriptions of Associations:Intact  Orientation:Full (Time, Place and Person)  Thought Content:WDL    Hallucinations:None  Ideas of Reference:None  Suicidal Thoughts:No  Homicidal Thoughts:No   Sensorium  Memory:Immediate Good; Recent Good  Judgment:Intact  Insight:Present   Executive Functions  Concentration:Good  Attention Span:Good  Recall:Good  Fund of Knowledge:Good  Language:Good   Psychomotor Activity  Psychomotor Activity:Normal   Assets  Assets:Communication Skills; Desire for Improvement; Housing; Social Support   Sleep  Sleep:Good  Number of hours:  No data recorded  Nutritional Assessment (For OBS and FBC admissions only) Has the patient had a weight loss or gain of 10 pounds or more in the last 3 months?: No Has the patient had a decrease in food intake/or appetite?: No Does the patient have dental problems?: No Does the patient have eating habits or behaviors that may be indicators of an eating disorder including binging or inducing vomiting?: No Has the patient recently lost weight without trying?: No Has the patient been eating poorly because of a decreased appetite?: No Malnutrition Screening Tool Score: 0    Physical Exam: Physical Exam Vitals and nursing note reviewed. Exam conducted with a chaperone present.  Constitutional:      General: He is not in acute distress.    Appearance: Normal appearance. He is not ill-appearing.  HENT:     Head: Normocephalic.  Cardiovascular:     Rate and Rhythm: Normal rate.  Pulmonary:     Effort: Pulmonary  effort is normal.  Musculoskeletal:     Cervical back: Normal range of motion.  Skin:    General: Skin is warm and dry.  Neurological:     Mental  Status: He is alert and oriented to person, place, and time.  Psychiatric:        Attention and Perception: Attention and perception normal. He does not perceive auditory or visual hallucinations.        Mood and Affect: Mood and affect normal.        Speech: Speech normal.        Behavior: Behavior normal. Behavior is cooperative.        Thought Content: Thought content is paranoid. Thought content is not delusional. Thought content does not include homicidal or suicidal ideation.        Cognition and Memory: Cognition and memory normal.        Judgment: Judgment normal.   Review of Systems  Constitutional: Negative.   HENT: Negative.    Eyes: Negative.   Respiratory: Negative.    Cardiovascular: Negative.   Gastrointestinal: Negative.   Genitourinary: Negative.   Musculoskeletal: Negative.   Skin: Negative.   Neurological: Negative.   Endo/Heme/Allergies: Negative.   Psychiatric/Behavioral:  Negative for hallucinations and memory loss. Depression: Stable. Substance abuse: Denies. Suicidal ideas: Denies.The patient does not have insomnia. Nervous/anxious: Stable.  Blood pressure 126/78, pulse 89, temperature 98.2 F (36.8 C), temperature source Oral, resp. rate 18, height 5\' 10"  (1.778 m), weight 150 lb (68 kg), SpO2 100 %. Body mass index is 21.52 kg/m.  Musculoskeletal: Strength & Muscle Tone: within normal limits Gait & Station: normal Patient leans: N/A   [facility] MSE Discharge Disposition for Follow up and Recommendations: Based on my evaluation the patient does not appear to have an emergency medical condition and can be discharged with resources and follow up care in outpatient services for Medication Management, Individual Therapy, and Group Therapy   Follow-up Information     Call  [facility]   Specialty: Professional Counselor Why: schedule an appointment for medication management and therapy Contact information: [place] [address] [place] [phone]         Call  [facility].   Why: In-office and online appointments available Contact information: [address] [place] [phone]         Go to  [facility].   Specialty: Urgent Care Why: Open Access:  Monday - Thursday from 8 am to 11 am for medication management and therapy intake.  On Friday from 1 pm to 4 pm for therapy intake only Contact information: [address] [place] [phone]        Call  [facility].   Contact information: [address]  [address] [place] [phone]                Other resources also given in handouts   Delesha Pohlman, NP 11/15/2020, 4:13 PM

## 2020-11-15 NOTE — BH Assessment (Signed)
Patient presents to [facility] with father stating he thinks he is having a mental health crisis . Patient denies SI/ HI/ AVH and endorsed having a few beers monthly. Patient was seen here previous and referred to outpatient . Patient saw Karen Kitchens for 2 visit and never continued therapy or went to get prescription from pharmacy . Patient has R arm paralysis and is alert and oriented x5. Patient is routine.

## 2020-12-07 ENCOUNTER — Other Ambulatory Visit: Payer: Self-pay

## 2020-12-07 ENCOUNTER — Ambulatory Visit (INDEPENDENT_AMBULATORY_CARE_PROVIDER_SITE_OTHER): Payer: No Payment, Other | Admitting: Psychiatry

## 2020-12-07 ENCOUNTER — Encounter (HOSPITAL_COMMUNITY): Payer: Self-pay | Admitting: Psychiatry

## 2020-12-07 VITALS — BP 125/77 | HR 69 | Ht 72.0 in | Wt 150.0 lb

## 2020-12-07 DIAGNOSIS — F411 Generalized anxiety disorder: Secondary | ICD-10-CM | POA: Diagnosis not present

## 2020-12-07 DIAGNOSIS — F330 Major depressive disorder, recurrent, mild: Secondary | ICD-10-CM | POA: Diagnosis not present

## 2020-12-07 MED ORDER — BUSPIRONE HCL 10 MG PO TABS: 10.0000 mg | ORAL_TABLET | Freq: Three times a day (TID) | ORAL | 2 refills | Status: DC

## 2020-12-07 NOTE — Progress Notes (Signed)
12/07/2020 12:40 PM Jeffery Baldwin  MRN:  [id number]  Chief Complaint: " I have not started the BuSpar or Strattera" Chief Complaint   Medication Management      HPI:  30 year old male seen today for follow up psychiatric evaluation.   He has a psychiatric history of Social anxiety, substance use, and generalized anxiety.  He is currently managed on Buspar 10 mg daily, and Strattera 40 mg daily.  He notes that since last visit he has not started medications.   Today he is well groomed, pleasant, cooperative, engaged in conversation, and maintained eye contact.  He informed Clinical research associate that since his last visit he has not started BuSpar or Strattera.  He notes that he takes Klonopin as needed which he receives from a friend.  He also notes that he takes tramadol that he receives from his friend for pain.  He informed Clinical research associate that he is in pain most days and notes that his right shoulder and arm constantly hurts.  He informed Clinical research associate that he has tried Lyrica, gabapentin, Cymbalta, and reports he misused Roxicodone.  He Diplomatic Services operational officer that they were all ineffective in managing his psychiatric conditions and pain.  Patient notes that he has minimal anxiety and depression.  Today provider conducted a GAD-7 and patient scored a 7.  Provider also conducted a PHQ-9 and patient scored an 8.  Today he denies SI/HI/VAH or mania.  Patient does endorse paranoia.  On 11/15/2020 he was seen at [facility] for paranoia.  Per chart patient believed "that phones are tapped, bank accounts have been hacked and named off seral other things that patient has been taking about".  Patient notes that he believes his family are gas lighting him.  He notes that he feels that they are out to get him.  He notes that they provide about his disability and trying to take things from him.  Provider asked patient if he was interested in starting an antidepressant or psychiatric to help manage symptoms of anxiety, depression, and paranoia.   He notes that he dislikes antidepressants and notes that he does not want to start an antipsychotic at this time.  He notes that he will try BuSpar to help manage his anxiety.  He also informed provider that he will continue taking Klonopin that he received from his friend.  Provider informed patient that misuse of medications are not recommended.  He endorsed understanding.  Patient referred to outpatient counseling for therapy.  No other concerns noted at this time.    Visit Diagnosis:    ICD-10-CM   1. Mild episode of recurrent major depressive disorder (HCC)  F33.0 busPIRone (BUSPAR) 10 MG tablet    Ambulatory referral to Social Work    2. Generalized anxiety disorder  F41.1 busPIRone (BUSPAR) 10 MG tablet    Ambulatory referral to Social Work      Past Psychiatric History: Social anxiety, substance use, and generalized anxiety. Patient notes that he was on Ritalin in the past however notes that he does not remmber having a diagnosis of ADD or ADHD  Past Medical History:  Past Medical History:  Diagnosis Date   Chronic pain syndrome    Heroin abuse (HCC)    Nerve root avulsion    Spinal cord injury at C5-C7 level without injury of spinal bone (HCC) 2011    Past Surgical History:  Procedure Laterality Date   nerve root repair      Family Psychiatric History: Denies  Family History: History  reviewed. No pertinent family history.  Social History:  Social History   Socioeconomic History   Marital status: Single    Spouse name: Not on file   Number of children: Not on file   Years of education: Not on file   Highest education level: Not on file  Occupational History   Not on file  Tobacco Use   Smoking status: Some Days    Packs/day: 0.20    Years: 7.00    Pack years: 1.40    Types: Cigarettes   Smokeless tobacco: Never   Tobacco comments:    decreased smoking  Substance and Sexual Activity   Alcohol use: Yes    Alcohol/week: 0.0 standard drinks    Comment:  occasional   Drug use: Yes    Comment: heroine   Sexual activity: Yes  Other Topics Concern   Not on file  Social History Narrative   ** Merged History Encounter **       Social Determinants of Health   Financial Resource Strain: Not on file  Food Insecurity: Not on file  Transportation Needs: Not on file  Physical Activity: Not on file  Stress: Not on file  Social Connections: Not on file    Allergies:  Allergies  Allergen Reactions   Morphine And Related Other (See Comments)    Emesis   Ms Contin [Morphine Sulfate] Nausea And Vomiting    Metabolic Disorder Labs: No results found for: HGBA1C, MPG No results found for: PROLACTIN No results found for: CHOL, TRIG, HDL, CHOLHDL, VLDL, LDLCALC No results found for: TSH  Therapeutic Level Labs: No results found for: LITHIUM No results found for: VALPROATE No components found for:  CBMZ  Current Medications: Current Outpatient Medications  Medication Sig Dispense Refill   busPIRone (BUSPAR) 10 MG tablet Take 1 tablet (10 mg total) by mouth 3 (three) times daily. 90 tablet 2   No current facility-administered medications for this visit.     Musculoskeletal: Strength & Muscle Tone: within normal limits Gait & Station: normal Patient leans: N/A  Psychiatric Specialty Exam: Review of Systems  Blood pressure 125/77, pulse 69, height 6' (1.829 m), weight 150 lb (68 kg).Body mass index is 20.34 kg/m.  General Appearance: Well Groomed  Eye Contact:  Good  Speech:  Clear and Coherent and Normal Rate  Volume:  Normal  Mood:  Euthymic  Affect:  Appropriate and Congruent  Thought Process:  Coherent, Goal Directed and Linear  Orientation:  Full (Time, Place, and Person)  Thought Content: Logical and Paranoid Ideation   Suicidal Thoughts:  No  Homicidal Thoughts:  No  Memory:  Immediate;   Good Recent;   Good Remote;   Good  Judgement:  Good  Insight:  Good  Psychomotor Activity:  Normal  Concentration:   Concentration: Fair and Attention Span: Fair  Recall:  Good  Fund of Knowledge: Good  Language: Good  Akathisia:  No  Handed:  Left  AIMS (if indicated): Not done  Assets:  Communication Skills Desire for Improvement Financial Resources/Insurance Housing Social Support  ADL's:  Intact  Cognition: WNL  Sleep:  Good   Screenings: GAD-7    Flowsheet Row Clinical Support from 12/07/2020 in [facility]  Total GAD-7 Score 7      PHQ2-9    Flowsheet Row Clinical Support from 12/07/2020 in [facility] ED from 12/20/2019 in [facility] Office Visit from 08/10/2014 in [facility] Office  Visit from 07/09/2014 in [name] Office Visit from 07/02/2014 in [facility]  PHQ-2 Total Score 2 4 0 0 0  PHQ-9 Total Score 8 12 -- -- --      Flowsheet Row ED from 12/20/2019 in [facility]  C-SSRS RISK CATEGORY No Risk        Assessment and Plan: Patient endorses mild anxiety and depression.  He also endorses paranoia. Provider asked patient if he was interested in starting an antidepressant or psychiatric to help manage symptoms of anxiety, depression, and paranoia.  He notes that he dislikes antidepressants and notes that he does not want to start an antipsychotic at this time.  He notes that he will try BuSpar to help manage his anxiety.  He also informed provider that he will continue taking Klonopin that he received from his friend.  Provider informed patient that misuse of medications are not recommended.  He endorsed understanding.  Patient referred to outpatient counseling for therapy.  1. Mild episode of recurrent major depressive disorder (HCC)  Restart- busPIRone (BUSPAR) 10 MG tablet; Take 1 tablet (10 mg total) by mouth 3 (three) times daily.  Dispense: 90 tablet; Refill: 2 - Ambulatory referral to Social  Work 2. Generalized anxiety disorder  Restart- busPIRone (BUSPAR) 10 MG tablet; Take 1 tablet (10 mg total) by mouth 3 (three) times daily.  Dispense: 90 tablet; Refill: 2 - Ambulatory referral to Social Work    Follow-up in 2 months Follow-up with therapy Shanna Cisco, NP 12/07/2020, 12:40 PM

## 2021-01-22 ENCOUNTER — Emergency Department (HOSPITAL_COMMUNITY)
Admission: EM | Admit: 2021-01-22 | Discharge: 2021-01-23 | Disposition: A | Discharge status: Home / Self Care | Payer: Medicaid Other | Attending: Emergency Medicine | Admitting: Emergency Medicine

## 2021-01-22 ENCOUNTER — Encounter (HOSPITAL_COMMUNITY): Payer: Self-pay | Admitting: Emergency Medicine

## 2021-01-22 DIAGNOSIS — F419 Anxiety disorder, unspecified: Secondary | ICD-10-CM | POA: Insufficient documentation

## 2021-01-22 DIAGNOSIS — F329 Major depressive disorder, single episode, unspecified: Secondary | ICD-10-CM | POA: Insufficient documentation

## 2021-01-22 DIAGNOSIS — Z79899 Other long term (current) drug therapy: Secondary | ICD-10-CM | POA: Insufficient documentation

## 2021-01-22 DIAGNOSIS — Z20822 Contact with and (suspected) exposure to covid-19: Secondary | ICD-10-CM | POA: Insufficient documentation

## 2021-01-22 DIAGNOSIS — I10 Essential (primary) hypertension: Secondary | ICD-10-CM | POA: Insufficient documentation

## 2021-01-22 DIAGNOSIS — F17210 Nicotine dependence, cigarettes, uncomplicated: Secondary | ICD-10-CM | POA: Insufficient documentation

## 2021-01-22 NOTE — ED Notes (Signed)
Patient refused blood tests and urine test at triage .

## 2021-01-22 NOTE — ED Triage Notes (Signed)
Patient requesting psychiatric treatment for his increasing anxiety / depression for several weeks , he has not taken his psychiatric medications for a long time , denies hallucinations / no SI or HI .

## 2021-01-22 NOTE — ED Notes (Signed)
Pt refused to provide blood and urine samples

## 2021-01-23 DIAGNOSIS — F419 Anxiety disorder, unspecified: Secondary | ICD-10-CM

## 2021-01-23 DIAGNOSIS — F411 Generalized anxiety disorder: Secondary | ICD-10-CM | POA: Insufficient documentation

## 2021-01-23 LAB — COMPREHENSIVE METABOLIC PANEL
ALT: 99 U/L — ABNORMAL HIGH (ref 0–44)
AST: 118 U/L — ABNORMAL HIGH (ref 15–41)
Albumin: 4.3 g/dL (ref 3.5–5.0)
Alkaline Phosphatase: 44 U/L (ref 38–126)
Anion gap: 9 (ref 5–15)
BUN: 8 mg/dL (ref 6–20)
CO2: 25 mmol/L (ref 22–32)
Calcium: 9.3 mg/dL (ref 8.9–10.3)
Chloride: 104 mmol/L (ref 98–111)
Creatinine, Ser: 0.76 mg/dL (ref 0.61–1.24)
GFR, Estimated: >60 mL/min (ref >60)
Glucose, Bld: 88 mg/dL (ref 70–99)
Potassium: 3.4 mmol/L — ABNORMAL LOW (ref 3.5–5.1)
Sodium: 138 mmol/L (ref 135–145)
Total Bilirubin: 0.7 mg/dL (ref 0.3–1.2)
Total Protein: 6.5 g/dL (ref 6.5–8.1)

## 2021-01-23 LAB — RAPID URINE DRUG SCREEN, HOSP PERFORMED
Amphetamines: NOT DETECTED
Barbiturates: NOT DETECTED
Benzodiazepines: POSITIVE — AB
Cocaine: NOT DETECTED
Opiates: NOT DETECTED
Tetrahydrocannabinol: NOT DETECTED

## 2021-01-23 LAB — CBC WITH DIFFERENTIAL/PLATELET
Abs Immature Granulocytes: 0.02 10*3/uL (ref 0.00–0.07)
Basophils Absolute: 0 10*3/uL (ref 0.0–0.1)
Basophils Relative: 0 %
Eosinophils Absolute: 0.1 10*3/uL (ref 0.0–0.5)
Eosinophils Relative: 1 %
HCT: 44.3 % (ref 39.0–52.0)
Hemoglobin: 15.7 g/dL (ref 13.0–17.0)
Immature Granulocytes: 0 %
Lymphocytes Relative: 23 %
Lymphs Abs: 1.9 10*3/uL (ref 0.7–4.0)
MCH: 32.6 pg (ref 26.0–34.0)
MCHC: 35.4 g/dL (ref 30.0–36.0)
MCV: 91.9 fL (ref 80.0–100.0)
Monocytes Absolute: 0.4 10*3/uL (ref 0.1–1.0)
Monocytes Relative: 5 %
Neutro Abs: 5.9 10*3/uL (ref 1.7–7.7)
Neutrophils Relative %: 71 %
Platelets: 175 10*3/uL (ref 150–400)
RBC: 4.82 MIL/uL (ref 4.22–5.81)
RDW: 12.1 % (ref 11.5–15.5)
WBC: 8.3 10*3/uL (ref 4.0–10.5)
nRBC: 0 % (ref 0.0–0.2)

## 2021-01-23 LAB — RESP PANEL BY RT-PCR (FLU A&B, COVID) ARPGX2
Influenza A by PCR: NEGATIVE
Influenza B by PCR: NEGATIVE
SARS Coronavirus 2 by RT PCR: NEGATIVE

## 2021-01-23 LAB — ETHANOL: Alcohol, Ethyl (B): <10 mg/dL (ref <10)

## 2021-01-23 MED ORDER — LORAZEPAM 1 MG PO TABS
1.0000 mg | ORAL_TABLET | Freq: Once | ORAL | Status: AC
  Administered 2021-01-23: 1 mg via ORAL
  Filled 2021-01-23: qty 1

## 2021-01-23 MED ORDER — NAPROXEN 250 MG PO TABS
500.0000 mg | ORAL_TABLET | Freq: Once | ORAL | Status: AC
  Administered 2021-01-23: 500 mg via ORAL
  Filled 2021-01-23: qty 2

## 2021-01-23 MED ORDER — ACETAMINOPHEN 500 MG PO TABS
1000.0000 mg | ORAL_TABLET | Freq: Once | ORAL | Status: AC
  Administered 2021-01-23: 1000 mg via ORAL
  Filled 2021-01-23: qty 2

## 2021-01-23 NOTE — ED Notes (Addendum)
Pt belongings inventoried. Placed in purple zone locker 3.

## 2021-01-23 NOTE — ED Provider Notes (Signed)
[facility] EMERGENCY DEPARTMENT Provider Note   CSN: [id number] Arrival date & time: 01/22/21  2317     History Chief Complaint  Patient presents with   Anxiety    Jeffery Baldwin is a 30 y.o. male.  30 year old male with a history of chronic pain syndrome, heroin abuse, major depressive disorder, ADHD presents to the emergency department for complaints of increasing anxiety and depression.  Does report taking his prescribed BuSpar following his most recent psychiatric follow-up visit 6 weeks ago, but discontinued it shortly after.  He states he has been off of it now for a few weeks.  Has intermittently been using Klonopin that he receives from a friend. Also endorses some sporadic tramadol use/abuse. Denies withdrawal symptoms or seizure activity. Feels like there has been a lot of "gas lighting" lately. Apparently is recently homeless. A friend, no longer at bedside, suggested to RN that mental health has deteriorated since the passing of the patient's mother. Was last evaluated at [facility] in June. Denies SI/HI.  The history is provided by the patient. No language interpreter was used.  Anxiety      Past Medical History:  Diagnosis Date   Chronic pain syndrome    Heroin abuse (HCC)    Nerve root avulsion    Spinal cord injury at C5-C7 level without injury of spinal bone (HCC) 2011    Patient Active Problem List   Diagnosis Date Noted   Paranoia (HCC) 11/15/2020   Attention deficit hyperactivity disorder (ADHD), predominantly inattentive type 01/21/2020   Mild episode of recurrent major depressive disorder (HCC) 01/21/2020   Paralysis of right upper extremity (HCC)    Chest pain    IVDU (intravenous drug user)    Fever 11/13/2014   Encounter for chronic pain management 06/29/2014   Generalized anxiety disorder 05/23/2014   Cellulitis and abscess of hand    Essential hypertension, benign    Cellulitis of right hand 04/27/2014   Cellulitis of right upper  extremity 04/27/2014   Gastroenteritis 04/10/2014   Right arm pain 04/10/2014   Anisocoria 01/19/2014   Left leg injury 12/31/2013   Left hand tendonitis 09/09/2013   Counseling and coordination of care 05/12/2013   Closed fracture of unspecified part of upper end of humerus 01/29/2013   Obesity 05/17/2012   Elevated BP 02/16/2012   Chronic pain syndrome 02/16/2012   Social anxiety disorder 08/25/2011   Former smoker 09/13/2009   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  Social History   Tobacco Use   Smoking status: Some Days    Packs/day: 0.20    Years: 7.00    Pack years: 1.40    Types: Cigarettes   Smokeless tobacco: Never   Tobacco comments:    decreased smoking  Substance Use Topics   Alcohol use: Yes    Alcohol/week: 0.0 standard drinks    Comment: occasional   Drug use: Yes    Comment: heroine    Home Medications Prior to Admission medications   Medication Sig Start Date End Date Taking? Authorizing Provider  Aspirin-Acetaminophen-Caffeine (GOODYS EXTRA STRENGTH PO) Take 1 packet by mouth daily as needed (headache/pain).   Yes [provider]  busPIRone (BUSPAR) 10 MG tablet Take 1 tablet (10 mg total) by mouth 3 (three) times daily. Patient not taking: No sig reported 12/07/20   Doyne Keel,  Brittney E, NP  amitriptyline (ELAVIL) 50 MG tablet Take 1 tablet (50 mg total) by mouth at bedtime. Patient not taking: Reported on 11/13/2014 06/10/14 09/01/15  Narda Bonds, MD  atomoxetine (STRATTERA) 40 MG capsule Take 1 capsule (40 mg total) by mouth daily. 02/27/20 11/15/20  Shanna Cisco, NP  Calcium Carb-Cholecalciferol (CALCIUM PLUS VITAMIN D3) 600-500 MG-UNIT CAPS Take 1 capsule by mouth daily. Patient not taking: Reported on 08/29/2015 04/10/14 09/01/15  Narda Bonds, MD    Allergies    Morphine and related and Ms contin [morphine  sulfate]  Review of Systems   Review of Systems Ten systems reviewed and are negative for acute change, except as noted in the HPI.    Physical Exam Updated Vital Signs BP 131/76 (BP Location: Left Arm)   Pulse (!) 105   Temp 98.5 F (36.9 C) (Oral)   Resp 16   Ht 5\' 11"  (1.803 m)   Wt 68.9 kg   SpO2 100%   BMI 21.20 kg/m   Physical Exam Vitals and nursing note reviewed.  Constitutional:      General: He is not in acute distress.    Appearance: He is well-developed. He is not diaphoretic.     Comments: Appears to have lost quite a bit of weight in comparison to chart photo  HENT:     Head: Normocephalic and atraumatic.  Eyes:     General: No scleral icterus.    Conjunctiva/sclera: Conjunctivae normal.  Pulmonary:     Effort: Pulmonary effort is normal. No respiratory distress.     Comments: Respirations even and unlabored Musculoskeletal:        General: Normal range of motion.     Cervical back: Normal range of motion.  Skin:    General: Skin is warm and dry.     Coloration: Skin is not pale.     Findings: No erythema or rash.  Neurological:     Mental Status: He is alert and oriented to person, place, and time.  Psychiatric:        Speech: Speech is tangential.        Behavior: Behavior is withdrawn. Behavior is cooperative.        Thought Content: Thought content does not include homicidal or suicidal ideation.    ED Results / Procedures / Treatments   Labs (all labs ordered are listed, but only abnormal results are displayed) Labs Reviewed  COMPREHENSIVE METABOLIC PANEL - Abnormal; Notable for the following components:      Result Value   Potassium 3.4 (*)    AST 118 (*)    ALT 99 (*)    All other components within normal limits  RESP PANEL BY RT-PCR (FLU A&B, COVID) ARPGX2  CBC WITH DIFFERENTIAL/PLATELET  ETHANOL  RAPID URINE DRUG SCREEN, HOSP PERFORMED    EKG None  Radiology No results found.  Procedures Procedures   Medications Ordered  in ED Medications  acetaminophen (TYLENOL) tablet 1,000 mg (1,000 mg Oral Given 01/23/21 0126)  naproxen (NAPROSYN) tablet 500 mg (500 mg Oral Given 01/23/21 0259)  LORazepam (ATIVAN) tablet 1 mg (1 mg Oral Given 01/23/21 0259)    ED Course  I have reviewed the triage vital signs and the nursing notes.  Pertinent labs & imaging results that were available during my care of the patient were reviewed by me and considered in my medical decision making (see chart for details).  Clinical Course as of 01/23/21 0559  01/25/21 Jan 23, 2021  0236 Patient with mild transaminitis likely secondary to polysubstance abuse history.  Otherwise stable for clearance. [KH]  0451 Nira Conn, PA-C recommends patient be observed and evaluated by psychiatry later this morning [KH]    Clinical Course User Index [KH] Darylene Price   MDM Rules/Calculators/A&P                           30 year old male presents to the emergency department for psychiatric evaluation.  Complains of untreated anxiety, but has been noncompliant with his prescribed medications.  Does have a history of polysubstance abuse and has been abusing Klonopin and tramadol.  Denies any seizures from misuse or withdrawal.  He has been medically cleared and evaluated by TTS who recommend repeat assessment in the morning following overnight observation.  Disposition to be determined by oncoming ED provider.   Final Clinical Impression(s) / ED Diagnoses Final diagnoses:  Anxiety    Rx / DC Orders ED Discharge Orders     None        [name], PA-C 01/23/21 0602    Geoffery Lyons, MD 01/24/21 [phone]

## 2021-01-23 NOTE — BH Assessment (Signed)
Comprehensive Clinical Assessment (CCA) Note  01/23/2021 [facility] [id number]  DISPOSITION: Gave clinical report to Jeffery ConnJason Berry, FNP who recommends Pt be observed and evaluated by psychiatry later this morning. Notified Jeffery MaduraKelly Humes, PA-C and [employer]ryn Cogan, RN of recommendation via secure message.  The patient demonstrates the following risk factors for suicide: Chronic risk factors for suicide include: psychiatric disorder of generalized anxiety disorder and previous suicide attempts hanging himself . Acute risk factors for suicide include: family or marital conflict, unemployment, and social withdrawal/isolation. Protective factors for this patient include: positive social support. Considering these factors, the overall suicide risk at this point appears to be low. Patient is appropriate for outpatient follow up.  Flowsheet Row ED [facility]  C-SSRS RISK CATEGORY No Risk No Risk      Pt is a 30 year old male who [facility]accompan[facility] to [facility] reporting he is experiencing "a mental crisis." He is a poor historian and is unable to answer many questions with any confidence. Pt reports he is experiencing "psychological torture" and "gaslighting." When asked if he could give an example of being gaslighted, Pt says people say they don't remember things that happened. When asked who is doing this, Pt states "everybody." Pt reports this mental crisis has been going on for months. He states [facility] because a friends recommended it. EDP note states friend, no longer at bedside, suggested to RN that mental health has deteriorated since the passing of the patient's mother. Pt says he cannot describe his mood. He acknowledges decreased concentration and racing thoughts. He denies current suicidal ideation. Pt says he has attempted suicide in the past by hanging himself.  He denies current homicidal ideation or history of violence. He denies auditory or visual hallucinations. Pt denies use of alcohol or other substances. He says he is prescribed Tramadol for a spinal injury. Pt's medical record indicates Pt has taken unprescribed benzodiazepines in the past. Pt's urine drugs screen is not currently available.  Pt says he lives with family but he cannot name which family members he lives with. EDP note indicates Pt is recently homeless. Pt cannot say whether he has ever been married, whether he has children, if he has ever been in [employer], and other basic questions. He initially said he has no mental health providers, then later says Jeffery ShortBrittany Parsons, NP is prescribing Buspar but he is not taking it.   Pt's medical record indicates [name] past and presented with paranoia, depressed mood, feelings of guilt, difficulty concentrating, hopelessness, impaired memory, and anxiety. Pt's medical record indicates his mother died in April 2020, his grandfather passed in May of 2021, and that he has one child.  Pt would not give permission to contact anyone for collateral information.  Pt is covered by a blanket, alert and oriented to person, place and situation. Pt speaks in a clear tone, at moderate volume and normal pace. Pt often appears to second guess his answers to questions. Motor behavior appears normal. Eye contact is good. Pt's mood is depressed and anxious, affect is congruent with mood. Thought process is coherent and at times circumstantial with delusional content. Memory is impaired. Concentration is poor. There is no indication from Pt's behavior that he is currently responding to internal stimuli. Pt was cooperative throughout assessment. He says he needs help but is unable to  states specifically what he needs.   Chief Complaint:  Chief Complaint  Patient presents with   Anxiety   Visit Diagnosis: F33.3 Major depressive  disorder, Recurrent episode, With psychotic features   CCA Screening, Triage and Referral (STR)  Patient Reported Information How did you hear about [place]? Self  Referral name: No data recorded Referral phone number: No data recorded  Whom do you see for routine medical problems? No data recorded Practice/Facility Name: No data recorded Practice/Facility Phone Number: No data recorded Name of Contact: No data recorded Contact Number: No data recorded Contact Fax Number: No data recorded Prescriber Name: No data recorded Prescriber Address (if known): No data recorded  What Is the Reason for Your Visit/Call Today? Pt reports he is "having a mental crisis" and that he is "being psychologically tortured" and experiencing "gaslighting."  How Long Has This Been Causing You Problems? 1-6 months  What Do You Feel Would Help You the Most Today? Treatment for Depression or other mood problem   Have You Recently Been in Any Inpatient Treatment ([facility]/Detox/Crisis [facility]/28-Day Program)? No data recorded Name/Location of Program/[facility]:No data recorded How Long Were You There? No data recorded When Were You Discharged? No data recorded  Have You Ever Received Services From [facility] Before? No data recorded Who Do You See at [facility]? No data recorded  Have You Recently Had Any Thoughts About Hurting Yourself? No  Are You Planning to Commit Suicide/Harm Yourself At This time? No   Have you Recently Had Thoughts About Hurting Someone [facility]? No  Explanation: No data recorded  Have You Used Any Alcohol or Drugs in the Past 24 Hours? No  How Long Ago Did You Use Drugs or Alcohol? No data recorded What Did You Use and How Much? No data recorded  Do You Currently Have a Therapist/Psychiatrist? Yes  Name of Therapist/Psychiatrist: Karen Kitchens, NP   Have You Been Recently Discharged From Any Office Practice or Programs? No  Explanation of Discharge From  Practice/Program: No data recorded    CCA Screening Triage Referral Assessment Type of Contact: Tele-Assessment  Is this Initial or Reassessment? Initial Assessment  Date Telepsych consult ordered in [facility]:  01/23/21  Time Telepsych consult ordered in [facility]:  [id number]   Patient Reported Information Reviewed? No data recorded Patient Left Without Being Seen? No data recorded Reason for Not Completing Assessment: No data recorded  Collateral Involvement: None   Does Patient Have a Court Appointed Legal Guardian? No data recorded Name and Contact of Legal Guardian: No data recorded If Minor and Not Living with Parent(s), Who has Custody? NA  Is CPS involved or ever been involved? Never  Is APS involved or ever been involved? Never   Patient Determined To Be At Risk for Harm To Self or Others Based on Review of Patient Reported Information or Presenting Complaint? No  Method: No data recorded Availability of Means: No data recorded Intent: No data recorded Notification Required: No data recorded Additional Information for Danger to Others Potential: No data recorded Additional Comments for Danger to Others Potential: No data recorded Are There Guns or Other Weapons in Your Home? No data recorded Types of Guns/Weapons: No data recorded Are These Weapons Safely Secured?                            No data recorded Who Could Verify You Are Able To Have These Secured: No data recorded Do You Have any  Outstanding Charges, Pending Court Dates, Parole/Probation? No data recorded Contacted To Inform of Risk of Harm To Self or Others: Unable to Contact:   Location of Assessment: [facility]   Does Patient Present under Involuntary Commitment? No  IVC Papers Initial File Date: No data recorded  [place] of Residence: [place]   Patient Currently Receiving the Following Services: Medication Management   Determination of Need: Urgent (48 hours)   Options For Referral: Inpatient  Hospitalization; Facility-Based Crisis; Outpatient Therapy     CCA Biopsychosocial Intake/Chief Complaint:  No data recorded Current Symptoms/Problems: No data recorded  Patient Reported Schizophrenia/Schizoaffective Diagnosis in Past: No   Strengths: Pt states he needs help  Preferences: No data recorded Abilities: No data recorded  Type of Services Patient Feels are Needed: No data recorded  Initial Clinical Notes/Concerns: No data recorded  Mental Health Symptoms Depression:   Change in energy/activity; Difficulty Concentrating; Fatigue; Irritability   Duration of Depressive symptoms:  Greater than two weeks   Mania:   Racing thoughts   Anxiety:    Restlessness; Tension; Difficulty concentrating; Worrying   Psychosis:   Other negative symptoms; Delusions (Pt appears paranoid and confused.)   Duration of Psychotic symptoms:  Less than six months   Trauma:   Avoids reminders of event   Obsessions:   None   Compulsions:   None   Inattention:   Disorganized; Forgetful; Symptoms present in 2 or more settings   Hyperactivity/Impulsivity:   N/A   Oppositional/Defiant Behaviors:   N/A   Emotional Irregularity:   None   Other Mood/Personality Symptoms:   NA    Mental Status Exam Appearance and self-care  Stature:   Average   Weight:   Average weight   Clothing:   -- (Covered by blanket)   Grooming:   Normal   Cosmetic use:   None   Posture/gait:   Normal   Motor activity:   Not Remarkable   Sensorium  Attention:   Confused; Distractible   Concentration:   Variable   Orientation:   X5   Recall/memory:   Defective in Short-term; Defective in Remote; Defective in Recent   Affect and Mood  Affect:   Anxious; Depressed   Mood:   Anxious; Depressed   Relating  Eye contact:   Normal   Facial expression:   Anxious   Attitude toward examiner:   Cooperative   Thought and Language  Speech flow:  Paucity   Thought  content:   Persecutions   Preoccupation:   None   Hallucinations:   None   Organization:  No data recorded  [employer] of Knowledge:   Average   Intelligence:   Average   Abstraction:   Concrete   Judgement:   Impaired   Reality Testing:   Distorted   Insight:   Lacking   Decision Making:   Confused   Social Functioning  Social Maturity:   Isolates   Social Judgement:   Normal   Stress  Stressors:   Family conflict   Coping Ability:   Human resources officer Deficits:   None   Supports:   Support needed     Religion: Religion/Spirituality Are You A Religious Person?: No  Leisure/Recreation: Leisure / Recreation Do You Have Hobbies?: No  Exercise/Diet: Exercise/Diet Do You Exercise?: No Have You Gained or Lost A Significant Amount of Weight in the Past Six Months?: No Do You Follow a Special Diet?: No Do You Have Any Trouble Sleeping?: No  CCA Employment/Education Employment/Work Situation: Employment / Work Situation Employment Situation: Unemployed Has Patient ever Been in Equities trader?:  (Pt states he does not know.)  Education: Education Is Patient Currently Attending School?: No Last Grade Completed:  (Pt states he does not know.) Did Theme park manager?:  (Pt states he does not know.) Did You Have An Individualized Education Program (IIEP):  (Pt states he does not know.) Did You Have Any Difficulty At School?:  (Pt states he does not know.) Patient's Education Has Been Impacted by Current Illness:  (Pt states he does not know.)   CCA Family/Childhood History Family and Relationship History: Family history Marital status:  (Pt states he does not know.) Does patient have children?:  (Pt states he does not know.)  Childhood History:  Childhood History By whom was/is the patient raised?:  (Pt states he does not know.) Did patient suffer any verbal/emotional/physical/sexual abuse as a child?:  (Pt states he  does not know.) Did patient suffer from severe childhood neglect?:  (Pt states he does not know.) Has patient ever been sexually abused/assaulted/raped as an adolescent or adult?:  (Pt states he does not know.) Was the patient ever a victim of a crime or a disaster?:  (Pt states he does not know.) Witnessed domestic violence?:  (Pt states he does not know.) Has patient been affected by domestic violence as an adult?:  (Pt states he does not know.)  Child/Adolescent Assessment:     CCA Substance Use Alcohol/Drug Use: Alcohol / Drug Use Pain Medications: Pt reports he take Tramadol Prescriptions: Buspar Over the Counter: None History of alcohol / drug use?: Yes (Pt denies substance use but medical record indicates a history of using unprescribed benzodiazepines.)                         ASAM's:  Six Dimensions of Multidimensional Assessment  Dimension 1:  Acute Intoxication and/or Withdrawal Potential:      Dimension 2:  Biomedical Conditions and Complications:      Dimension 3:  Emotional, Behavioral, or Cognitive Conditions and Complications:     Dimension 4:  Readiness to Change:     Dimension 5:  Relapse, Continued use, or Continued Problem Potential:     Dimension 6:  Recovery/Living Environment:     ASAM Severity Score:    ASAM Recommended Level of Treatment:     Substance use Disorder (SUD)    Recommendations for Services/Supports/Treatments:    DSM5 Diagnoses: Patient Active Problem List   Diagnosis Date Noted   Paranoia (HCC) 11/15/2020   Attention deficit hyperactivity disorder (ADHD), predominantly inattentive type 01/21/2020   Mild episode of recurrent major depressive disorder (HCC) 01/21/2020   Paralysis of right upper extremity (HCC)    Chest pain    IVDU (intravenous drug user)    Fever 11/13/2014   Encounter for chronic pain management 06/29/2014   Generalized anxiety disorder 05/23/2014   Cellulitis and abscess of hand    Essential  hypertension, benign    Cellulitis of right hand 04/27/2014   Cellulitis of right upper extremity 04/27/2014   Gastroenteritis 04/10/2014   Right arm pain 04/10/2014   Anisocoria 01/19/2014   Left leg injury 12/31/2013   Left hand tendonitis 09/09/2013   Counseling and coordination of care 05/12/2013   Closed fracture of unspecified part of upper end of humerus 01/29/2013   Obesity 05/17/2012   Elevated BP 02/16/2012   Chronic pain syndrome 02/16/2012   Social anxiety disorder  08/25/2011   Former smoker 09/13/2009   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

## 2021-01-23 NOTE — ED Notes (Signed)
Pt refusing to change into purple scrubs as requested and refusing to allow me to put valuables in locker in security

## 2021-01-23 NOTE — Consult Note (Signed)
Telepsych Consultation   Reason for Consult:  Depression and Anxiety  Referring Physician:  EPD Location of Patient: H 15C Location of Provider: [facility]  Patient Identification: Jeffery Baldwin MRN:  [id number] Principal Diagnosis: <principal problem not specified> Diagnosis:  Active Problems:   * No active hospital problems. *   Total Time spent with patient: 15 minutes  Subjective:   Jeffery Baldwin is a 30 y.o. male patient admitted depression and anxiety.  Patient to be reassessed by psychiatry 4 hours later. Patient reports "I feel like I am reliving my life over and over again" reports he is followed by Dr. Doyne Keel for medication management however is unable to recall medications that he has been prescribed.  Does report abusing tramadol.  States he does have a prescription for BuSpar.  UDS positive for benzos.  He denies suicidal or homicidal ideations.  Attempted to obtain additional collateral.  Patient declined contact follow-up with family members.  We will make additional outpatient resources available.  HPI: Per admission assessment." Jeffery Baldwin is a 30 year old male who presents unaccompanied to [name] ED reporting he is experiencing "a mental crisis." He is a poor historian and is unable to answer many questions with any confidence. Jeffery Baldwin reports he is experiencing "psychological torture" and "gaslighting." When asked if he could give an example of being gaslighted, Jeffery Baldwin says people say they don't remember things that happened. When asked who is doing this, Jeffery Baldwin states "everybody." Jeffery Baldwin reports this mental crisis has been going on for months. He states he came to [facility] tonight because a friends recommended it. EDP note states friend, no longer at bedside, suggested to RN that mental health has deteriorated since the passing of the patient's mother. Jeffery Baldwin says he cannot describe his mood. He acknowledges decreased concentration and racing thoughts. He denies current suicidal ideation.  Jeffery Baldwin says he has attempted suicide in the past by hanging himself. He denies current homicidal ideation or history of violence. He denies auditory or visual hallucinations. Jeffery Baldwin denies use of alcohol or other substances. He says he is prescribed Tramadol for a spinal injury. Jeffery Baldwin's medical record indicates Jeffery Baldwin has taken unprescribed benzodiazepines in the past. Jeffery Baldwin's urine drugs screen is not currently available."    Past Psychiatric History:   Risk to Self:   Risk to Others:   Prior Inpatient Therapy:   Prior Outpatient Therapy:    Past Medical History:  Past Medical History:  Diagnosis Date   Chronic pain syndrome    Heroin abuse (HCC)    Nerve root avulsion    Spinal cord injury at C5-C7 level without injury of spinal bone (HCC) 2011    Past Surgical History:  Procedure Laterality Date   nerve root repair     Family History: No family history on file. Family Psychiatric  History:  Social History:  Social History   Substance and Sexual Activity  Alcohol Use Yes   Alcohol/week: 0.0 standard drinks   Comment: occasional     Social History   Substance and Sexual Activity  Drug Use Yes   Comment: heroine    Social History   Socioeconomic History   Marital status: Single    Spouse name: Not on file   Number of children: Not on file   Years of education: Not on file   Highest education level: Not on file  Occupational History   Not on file  Tobacco Use   Smoking status: Some Days    Packs/day: 0.20  Years: 7.00    Pack years: 1.40    Types: Cigarettes   Smokeless tobacco: Never   Tobacco comments:    decreased smoking  Substance and Sexual Activity   Alcohol use: Yes    Alcohol/week: 0.0 standard drinks    Comment: occasional   Drug use: Yes    Comment: heroine   Sexual activity: Yes  Other Topics Concern   Not on file  Social History Narrative   ** Merged History Encounter **       Social Determinants of Health   Financial Resource Strain: Not on file   Food Insecurity: Not on file  Transportation Needs: Not on file  Physical Activity: Not on file  Stress: Not on file  Social Connections: Not on file   Additional Social History:    Allergies:   Allergies  Allergen Reactions   Morphine And Related Other (See Comments)    Emesis   Ms Contin [Morphine Sulfate] Nausea And Vomiting    Labs:  Results for orders placed or performed during the hospital encounter of 01/22/21 (from the past 48 hour(s))  CBC with Differential     Status: None   Collection Time: 01/23/21  1:01 AM  Result Value Ref Range   WBC 8.3 4.0 - 10.5 K/uL   RBC 4.82 4.22 - 5.81 MIL/uL   Hemoglobin 15.7 13.0 - 17.0 g/dL   HCT 16.144.3 09.639.0 - 04.552.0 %   MCV 91.9 80.0 - 100.0 fL   MCH 32.6 26.0 - 34.0 pg   MCHC 35.4 30.0 - 36.0 g/dL   RDW 40.912.1 81.111.5 - 91.415.5 %   Platelets 175 150 - 400 K/uL   nRBC 0.0 0.0 - 0.2 %   Neutrophils Relative % 71 %   Neutro Abs 5.9 1.7 - 7.7 K/uL   Lymphocytes Relative 23 %   Lymphs Abs 1.9 0.7 - 4.0 K/uL   Monocytes Relative 5 %   Monocytes Absolute 0.4 0.1 - 1.0 K/uL   Eosinophils Relative 1 %   Eosinophils Absolute 0.1 0.0 - 0.5 K/uL   Basophils Relative 0 %   Basophils Absolute 0.0 0.0 - 0.1 K/uL   Immature Granulocytes 0 %   Abs Immature Granulocytes 0.02 0.00 - 0.07 K/uL    Comment: Performed at [facility], [address] [place], [place] [address]  Comprehensive metabolic panel     Status: Abnormal   Collection Time: 01/23/21  1:01 AM  Result Value Ref Range   Sodium 138 135 - 145 mmol/L   Potassium 3.4 (L) 3.5 - 5.1 mmol/L   Chloride 104 98 - 111 mmol/L   CO2 25 22 - 32 mmol/L   Glucose, Bld 88 70 - 99 mg/dL    Comment: Glucose reference range applies only to samples taken after fasting for at least 8 hours.   BUN 8 6 - 20 mg/dL   Creatinine, Ser 6.210.76 0.61 - 1.24 mg/dL   Calcium 9.3 8.9 - 30.810.3 mg/dL   Total Protein 6.5 6.5 - 8.1 g/dL   Albumin 4.3 3.5 - 5.0 g/dL   AST 657118 (H) 15 - 41 U/L   ALT 99 (H) 0 - 44  U/L   Alkaline Phosphatase 44 38 - 126 U/L   Total Bilirubin 0.7 0.3 - 1.2 mg/dL   GFR, Estimated >84>60 >69>60 mL/min    Comment: (NOTE) Calculated using the CKD-EPI Creatinine Equation (2021)    Anion gap 9 5 - 15    Comment: Performed at [facility], [address] [place], [place] [address]  Ethanol     Status: None   Collection Time: 01/23/21  1:01 AM  Result Value Ref Range   Alcohol, Ethyl (B) <10 <10 mg/dL    Comment: (NOTE) Lowest detectable limit for serum alcohol is 10 mg/dL.  For medical purposes only. Performed at [facility], [address], [place], [place] [address]   Rapid urine drug screen (hospital performed)     Status: Abnormal   Collection Time: 01/23/21  1:01 AM  Result Value Ref Range   Opiates NONE DETECTED NONE DETECTED   Cocaine NONE DETECTED NONE DETECTED   Benzodiazepines POSITIVE (A) NONE DETECTED   Amphetamines NONE DETECTED NONE DETECTED   Tetrahydrocannabinol NONE DETECTED NONE DETECTED   Barbiturates NONE DETECTED NONE DETECTED    Comment: (NOTE) DRUG SCREEN FOR MEDICAL PURPOSES ONLY.  IF CONFIRMATION IS NEEDED FOR ANY PURPOSE, NOTIFY LAB WITHIN 5 DAYS.  LOWEST DETECTABLE LIMITS FOR URINE DRUG SCREEN Drug Class                     Cutoff (ng/mL) Amphetamine and metabolites    1000 Barbiturate and metabolites    200 Benzodiazepine                 200 Tricyclics and metabolites     300 Opiates and metabolites        300 Cocaine and metabolites        300 THC                            50 Performed at [facility], [address], [place], [place] [address]   Resp Panel by RT-PCR (Flu A&B, Covid) Nasopharyngeal Swab     Status: None   Collection Time: 01/23/21  1:04 AM   Specimen: Nasopharyngeal Swab; Nasopharyngeal(NP) swabs in vial transport medium  Result Value Ref Range   SARS Coronavirus 2 by RT PCR NEGATIVE NEGATIVE    Comment: (NOTE) SARS-CoV-2 target nucleic acids are NOT DETECTED.  The SARS-CoV-2 RNA is  generally detectable in upper respiratory specimens during the acute phase of infection. The lowest concentration of SARS-CoV-2 viral copies this assay can detect is 138 copies/mL. A negative result does not preclude SARS-Cov-2 infection and should not be used as the sole basis for treatment or other patient management decisions. A negative result may occur with  improper specimen collection/handling, submission of specimen other than nasopharyngeal swab, presence of viral mutation(s) within the areas targeted by this assay, and inadequate number of viral copies(<138 copies/mL). A negative result must be combined with clinical observations, patient history, and epidemiological information. The expected result is Negative.  Fact Sheet for Patients:  BloggerCourse.com  Fact Sheet for Healthcare Providers:  SeriousBroker.it  This test is no t yet approved or cleared by the [place] FDA and  has been authorized for detection and/or diagnosis of SARS-CoV-2 by FDA under an Emergency Use Authorization (EUA). This EUA will remain  in effect (meaning this test can be used) for the duration of the COVID-19 declaration under Section 564(b)(1) of the Act, 21 U.S.C.section 360bbb-3(b)(1), unless the authorization is terminated  or revoked sooner.       Influenza A by PCR NEGATIVE NEGATIVE   Influenza B by PCR NEGATIVE NEGATIVE    Comment: (NOTE) The Xpert Xpress SARS-CoV-2/FLU/RSV plus assay is intended as an aid in the diagnosis of  influenza from Nasopharyngeal swab specimens and should not be used as a sole basis for treatment. Nasal washings and aspirates are unacceptable for Xpert Xpress SARS-CoV-2/FLU/RSV testing.  Fact Sheet for Patients: BloggerCourse.com  Fact Sheet for Healthcare Providers: SeriousBroker.it  This test is not yet approved or cleared by the [place] FDA  and has been authorized for detection and/or diagnosis of SARS-CoV-2 by FDA under an Emergency Use Authorization (EUA). This EUA will remain in effect (meaning this test can be used) for the duration of the COVID-19 declaration under Section 564(b)(1) of the Act, 21 U.S.C. section 360bbb-3(b)(1), unless the authorization is terminated or revoked.  Performed at [facility], [address], [place], [place] [address]     Medications:  No current facility-administered medications for this encounter.   Current Outpatient Medications  Medication Sig Dispense Refill   Aspirin-Acetaminophen-Caffeine (GOODYS EXTRA STRENGTH PO) Take 1 packet by mouth daily as needed (headache/pain).     busPIRone (BUSPAR) 10 MG tablet Take 1 tablet (10 mg total) by mouth 3 (three) times daily. (Patient not taking: No sig reported) 90 tablet 2    Musculoskeletal: Strength & Muscle Tone: within normal limits Gait & Station: normal Patient leans: N/A          Psychiatric Specialty Exam:  Presentation  General Appearance: Appropriate for Environment; Casual  Eye Contact:Good  Speech:Clear and Coherent; Normal Rate  Speech Volume:Normal  Handedness:Right   Mood and Affect  Mood:Anxious  Affect:Appropriate; Congruent   Thought Process  Thought Processes:Coherent; Goal Directed  Descriptions of Associations:Intact  Orientation:No data recorded Thought Content:WDL  History of Schizophrenia/Schizoaffective disorder:No  Duration of Psychotic Symptoms:Less than six months  Hallucinations:No data recorded Ideas of Reference:None  Suicidal Thoughts:No data recorded Homicidal Thoughts:No data recorded  Sensorium  Memory:Immediate Good; Recent Good  Judgment:Intact  Insight:Present   Executive Functions  Concentration:Good  Attention Span:Good  Recall:Good  Fund of Knowledge:Good  Language:Good   Psychomotor Activity  Psychomotor Activity: No data  recorded  Assets  Assets:Communication Skills; Desire for Improvement; Housing; Social Support   Sleep  Sleep: No data recorded   Physical Exam: Physical Exam Vitals reviewed.  Psychiatric:        Mood and Affect: Mood normal.        Thought Content: Thought content normal.   ROS Blood pressure 99/68, pulse 68, temperature (!) 97.5 F (36.4 C), temperature source Oral, resp. rate 20, height 5\' 11"  (1.803 m), weight 68.9 kg, SpO2 100 %. Body mass index is 21.2 kg/m.  Treatment Plan Summary: Daily contact with patient to assess and evaluate symptoms and progress in treatment and Medication management  Disposition: No evidence of imminent risk to self or others at present.   Patient does not meet criteria for psychiatric inpatient admission. Supportive therapy provided about ongoing stressors. Refer to IOP. Discussed crisis plan, support from social network, calling 911, coming to the Emergency Department, and calling Suicide Hotline.  This service was provided via telemedicine using a 2-way, interactive audio and video technology.  Names of all persons participating in this telemedicine service and their role in this encounter. Name: Jeffery Baldwin  Role: patient   Name: T.Nova Evett  Role: Nurse practitioner          Roderic Ovens, NP 01/23/2021 11:20 AM

## 2021-01-23 NOTE — ED Notes (Signed)
Pt verbalized understanding of d/c instructions, meds and followup care. Denies questions. VSS, no distress noted. Steady gait to exit.  

## 2021-01-23 NOTE — ED Notes (Signed)
Breakfast Ordered 

## 2021-01-23 NOTE — ED Provider Notes (Signed)
Pt was evaluated by psych again this morning.  He is stable for d/c.  He is to return if worse.    Jacalyn Lefevre, MD 01/23/21 1104

## 2021-01-23 NOTE — ED Notes (Signed)
TTS at bedside. 

## 2021-02-01 ENCOUNTER — Ambulatory Visit (HOSPITAL_COMMUNITY): Payer: No Payment, Other | Admitting: Licensed Clinical Social Worker

## 2021-02-02 ENCOUNTER — Ambulatory Visit (HOSPITAL_COMMUNITY): Payer: No Payment, Other | Admitting: Licensed Clinical Social Worker

## 2021-02-02 ENCOUNTER — Telehealth (HOSPITAL_COMMUNITY): Payer: Self-pay | Admitting: Licensed Clinical Social Worker

## 2021-02-02 NOTE — Telephone Encounter (Signed)
LCSW sent two links to patient phone with no response. LCSW f/u with PC. No answer. LCSW left HIPAA compliant VM. LCSW stayed in text links until 1019

## 2021-02-08 ENCOUNTER — Encounter (HOSPITAL_COMMUNITY): Payer: No Payment, Other | Admitting: Psychiatry

## 2021-02-22 ENCOUNTER — Encounter (HOSPITAL_COMMUNITY): Payer: Self-pay | Admitting: Registered Nurse

## 2021-02-22 ENCOUNTER — Ambulatory Visit (HOSPITAL_COMMUNITY)
Admission: EM | Admit: 2021-02-22 | Discharge: 2021-02-23 | Disposition: A | Discharge status: Home / Self Care | Payer: No Payment, Other | Attending: Psychiatry | Admitting: Psychiatry

## 2021-02-22 ENCOUNTER — Other Ambulatory Visit: Payer: Self-pay

## 2021-02-22 DIAGNOSIS — F411 Generalized anxiety disorder: Secondary | ICD-10-CM | POA: Insufficient documentation

## 2021-02-22 DIAGNOSIS — R45851 Suicidal ideations: Secondary | ICD-10-CM | POA: Diagnosis not present

## 2021-02-22 DIAGNOSIS — F29 Unspecified psychosis not due to a substance or known physiological condition: Secondary | ICD-10-CM | POA: Insufficient documentation

## 2021-02-22 DIAGNOSIS — F17210 Nicotine dependence, cigarettes, uncomplicated: Secondary | ICD-10-CM | POA: Insufficient documentation

## 2021-02-22 DIAGNOSIS — R45850 Homicidal ideations: Secondary | ICD-10-CM | POA: Insufficient documentation

## 2021-02-22 DIAGNOSIS — F329 Major depressive disorder, single episode, unspecified: Secondary | ICD-10-CM | POA: Insufficient documentation

## 2021-02-22 DIAGNOSIS — F909 Attention-deficit hyperactivity disorder, unspecified type: Secondary | ICD-10-CM | POA: Insufficient documentation

## 2021-02-22 DIAGNOSIS — Z20822 Contact with and (suspected) exposure to covid-19: Secondary | ICD-10-CM | POA: Insufficient documentation

## 2021-02-22 DIAGNOSIS — Z79899 Other long term (current) drug therapy: Secondary | ICD-10-CM | POA: Insufficient documentation

## 2021-02-22 LAB — COMPREHENSIVE METABOLIC PANEL
ALT: 58 U/L — ABNORMAL HIGH (ref 0–44)
AST: 40 U/L (ref 15–41)
Albumin: 5.3 g/dL — ABNORMAL HIGH (ref 3.5–5.0)
Alkaline Phosphatase: 57 U/L (ref 38–126)
Anion gap: 10 (ref 5–15)
BUN: <5 mg/dL — ABNORMAL LOW (ref 6–20)
CO2: 28 mmol/L (ref 22–32)
Calcium: 10.1 mg/dL (ref 8.9–10.3)
Chloride: 101 mmol/L (ref 98–111)
Creatinine, Ser: 0.82 mg/dL (ref 0.61–1.24)
GFR, Estimated: >60 mL/min (ref >60)
Glucose, Bld: 89 mg/dL (ref 70–99)
Potassium: 4 mmol/L (ref 3.5–5.1)
Sodium: 139 mmol/L (ref 135–145)
Total Bilirubin: 1.1 mg/dL (ref 0.3–1.2)
Total Protein: 7.6 g/dL (ref 6.5–8.1)

## 2021-02-22 LAB — CBC WITH DIFFERENTIAL/PLATELET
Abs Immature Granulocytes: 0.02 10*3/uL (ref 0.00–0.07)
Basophils Absolute: 0 10*3/uL (ref 0.0–0.1)
Basophils Relative: 0 %
Eosinophils Absolute: 0.1 10*3/uL (ref 0.0–0.5)
Eosinophils Relative: 1 %
HCT: 49.6 % (ref 39.0–52.0)
Hemoglobin: 17.5 g/dL — ABNORMAL HIGH (ref 13.0–17.0)
Immature Granulocytes: 0 %
Lymphocytes Relative: 31 %
Lymphs Abs: 2.5 10*3/uL (ref 0.7–4.0)
MCH: 32.3 pg (ref 26.0–34.0)
MCHC: 35.3 g/dL (ref 30.0–36.0)
MCV: 91.7 fL (ref 80.0–100.0)
Monocytes Absolute: 0.4 10*3/uL (ref 0.1–1.0)
Monocytes Relative: 5 %
Neutro Abs: 5 10*3/uL (ref 1.7–7.7)
Neutrophils Relative %: 63 %
Platelets: 194 10*3/uL (ref 150–400)
RBC: 5.41 MIL/uL (ref 4.22–5.81)
RDW: 11.9 % (ref 11.5–15.5)
WBC: 8 10*3/uL (ref 4.0–10.5)
nRBC: 0 % (ref 0.0–0.2)

## 2021-02-22 LAB — URINALYSIS, COMPLETE (UACMP) WITH MICROSCOPIC
Bacteria, UA: NONE SEEN
Bilirubin Urine: NEGATIVE
Glucose, UA: NEGATIVE mg/dL
Hgb urine dipstick: NEGATIVE
Ketones, ur: NEGATIVE mg/dL
Leukocytes,Ua: NEGATIVE
Nitrite: NEGATIVE
Protein, ur: NEGATIVE mg/dL
Specific Gravity, Urine: <1.005 — ABNORMAL LOW (ref 1.005–1.030)
Squamous Epithelial / HPF: NONE SEEN (ref 0–5)
pH: 7 (ref 5.0–8.0)

## 2021-02-22 LAB — POCT URINE DRUG SCREEN - MANUAL ENTRY (I-SCREEN)
POC Amphetamine UR: NOT DETECTED
POC Buprenorphine (BUP): NOT DETECTED
POC Cocaine UR: NOT DETECTED
POC Marijuana UR: POSITIVE — AB
POC Methadone UR: NOT DETECTED
POC Methamphetamine UR: NOT DETECTED
POC Morphine: NOT DETECTED
POC Oxazepam (BZO): NOT DETECTED
POC Oxycodone UR: NOT DETECTED
POC Secobarbital (BAR): NOT DETECTED

## 2021-02-22 LAB — LIPID PANEL
Cholesterol: 166 mg/dL (ref 0–200)
HDL: 93 mg/dL (ref >40)
LDL Cholesterol: 65 mg/dL (ref 0–99)
Total CHOL/HDL Ratio: 1.8 RATIO
Triglycerides: 42 mg/dL (ref <150)
VLDL: 8 mg/dL (ref 0–40)

## 2021-02-22 LAB — RESP PANEL BY RT-PCR (FLU A&B, COVID) ARPGX2
Influenza A by PCR: NEGATIVE
Influenza B by PCR: NEGATIVE
SARS Coronavirus 2 by RT PCR: NEGATIVE

## 2021-02-22 LAB — HIV ANTIBODY (ROUTINE TESTING W REFLEX): HIV Screen 4th Generation wRfx: NONREACTIVE

## 2021-02-22 LAB — ETHANOL: Alcohol, Ethyl (B): <10 mg/dL (ref <10)

## 2021-02-22 LAB — HEMOGLOBIN A1C
Hgb A1c MFr Bld: 4.9 % (ref 4.8–5.6)
Mean Plasma Glucose: 93.93 mg/dL

## 2021-02-22 LAB — POC SARS CORONAVIRUS 2 AG: SARSCOV2ONAVIRUS 2 AG: NEGATIVE

## 2021-02-22 LAB — POC SARS CORONAVIRUS 2 AG -  ED: SARS Coronavirus 2 Ag: NEGATIVE

## 2021-02-22 LAB — MAGNESIUM: Magnesium: 2.1 mg/dL (ref 1.7–2.4)

## 2021-02-22 MED ORDER — OLANZAPINE 5 MG PO TABS: 5.0000 mg | ORAL_TABLET | Freq: Once | ORAL | Status: DC

## 2021-02-22 MED ORDER — ALUM & MAG HYDROXIDE-SIMETH 200-200-20 MG/5ML PO SUSP: 30.0000 mL | ORAL | Status: DC | PRN

## 2021-02-22 MED ORDER — HYDROXYZINE HCL 25 MG PO TABS
25.0000 mg | ORAL_TABLET | Freq: Three times a day (TID) | ORAL | Status: DC | PRN
  Administered 2021-02-22: 25 mg via ORAL
  Filled 2021-02-22: qty 1

## 2021-02-22 MED ORDER — OLANZAPINE 5 MG PO TABS
5.0000 mg | ORAL_TABLET | Freq: Every day | ORAL | Status: DC
  Administered 2021-02-22: 5 mg via ORAL
  Filled 2021-02-22: qty 1

## 2021-02-22 MED ORDER — ACETAMINOPHEN 325 MG PO TABS: 650.0000 mg | ORAL_TABLET | Freq: Four times a day (QID) | ORAL | Status: DC | PRN

## 2021-02-22 MED ORDER — TRAZODONE HCL 50 MG PO TABS
50.0000 mg | ORAL_TABLET | Freq: Every evening | ORAL | Status: DC | PRN
  Administered 2021-02-22: 50 mg via ORAL
  Filled 2021-02-22: qty 1

## 2021-02-22 MED ORDER — MAGNESIUM HYDROXIDE 400 MG/5ML PO SUSP: 30.0000 mL | Freq: Every day | ORAL | Status: DC | PRN

## 2021-02-22 MED ORDER — RISPERIDONE 2 MG PO TBDP: 2.0000 mg | ORAL_TABLET | Freq: Every day | ORAL | Status: DC

## 2021-02-22 NOTE — Progress Notes (Signed)
Pt was accept to [facility] 02/22/21 can admit 2nd shift   Pt meets inpatient criteria per Vernard Gambles, NP  Attending Physician will be Amy Mason Jim  Report can be called to: - Adult unit: [phone]  Pt can arrive after 2nd shift  Team notified per secure chat: Ceasar Mons, RN, Nuala Alpha, RN, Allen Kell, Vernard Gambles, NP, Fransico Michael, NP, Awanda Mink, Nira Conn, NP, Jacques Navy, RN, Hansel Starling, RN, and Melbourne Abts, PA-C.   Kelton Pillar, LCSWA 02/22/2021 @ 11:59 PM

## 2021-02-22 NOTE — Progress Notes (Signed)
   02/22/21 1700  [facility] Triage Screening (Walk-ins at [facility] only)  What Is the Reason for Your Visit/Call Today? 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  He struggles to provide meaningful responses at this point, as he questions the questions being asked and responds with questions due to current confusion and paranoia. He endorses SI, stating he "can't keep doing this.  I'm tired."  He admits to a past attempt and then questions the reality of that attempt, believing it may not have happened.  He denies HI.  He denies AVH, however he appears to be responding to internal stimuli at points.  How Long Has This Been Causing You Problems? 1-6 months  Have You Recently Had Any Thoughts About Hurting Yourself? Yes  How long ago did you have thoughts about hurting yourself? today, feeling "this is all out of control" - no specified plan  Are You Planning to Commit Suicide/Harm Yourself At This time? No  Have you Recently Had Thoughts About Hurting Someone [name]? No  Are You Planning To Harm Someone At This Time? No  Are you currently experiencing any auditory, visual or other hallucinations? Yes  Please explain the hallucinations you are currently experiencing: paranoia, appears to be responding to internal stimuli at points and hears "someone snapping" by his ear  Have You Used Any Alcohol or Drugs in the Past 24 Hours? No  Do you have any current medical co-morbidities that require immediate attention? No  Clinician description of patient physical appearance/behavior: Pt with rt arm paralysis- he appears slightly agitated and paranoid/guarded  What Do You Feel Would Help You the Most Today? Treatment  for Depression or other mood problem  If access to [facility] was not available, would you have sought care in the Emergency Department? Yes  Determination of Need Urgent (48 hours)  Options For Referral Inpatient Hospitalization;Intensive Outpatient Therapy

## 2021-02-22 NOTE — ED Notes (Signed)
Pt stated that he had questions about the medications he would be taking tonight. I advised him of the three on file for him, zyprexa, vistaril, trazodone. The patient stated that all of the medications have terrible side effects, however he would be willing to take them.

## 2021-02-22 NOTE — ED Notes (Signed)
Pt A&O x 4, presents with suicidal ideations, no plan noted.  Pt admits to previous SI attempts.  Responding to internal stimuli.  Pt calm & cooperative at present, no distress noted.  Skin search completed.  Monitoring for safety.

## 2021-02-22 NOTE — ED Provider Notes (Signed)
Behavioral Health Admission H&P [facility] & OBS)  Date: 02/22/21 Patient Name: Jeffery Baldwin MRN: [id number] Chief Complaint: No chief complaint on file.     Diagnoses:  Final diagnoses:  Psychosis, 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HCC)    HPI:  Jeffery Baldwin, 30 yo male patient presented to [facility] as a walk in alone with complaints of "I cannot get control of my thoughts".  Per chart review patient follows up with Gretchen Short, NP.  His psychiatric history is ADHD, MDD and GAD.  Per chart review he takes BuSpar 10 mg tablet 3 times daily.  Patient is not a good historian.  Per Chart Review patient has been assessed by psychiatry on  01/23/2021 and 11/15/2020 for similar symptoms.  It appears patient condition has decompensated since that time.  Jeffery Baldwin, 30 y.o., male patient seen face to face by this provider, consulted with Dr. Bronwen Betters; and chart reviewed on 02/22/21.  On evaluation [name] reports this he is living life on repeat each day as if he is in a movie. During evaluation DREQUAN IRONSIDE is in sitting position in no acute distress.  He makes fleeting eye contact.  He is fairly groomed.  He is anxious.  His speech is clear, coherent, normal tone.  He is hyperverbal.  He is alert and oriented x4.  He is cooperative but has to be redirected in the conversation.  His thought process is disorganized.  He is unable to formulate appropriate responses to questions that are being asked.  He endorses paranoia and delusional thought.  He believes "a colonel root system has been embedded with malicious codes and his phone, TV, and other electric devices.  He no longer uses any electric devices.  States if he were to use the devices that "people could control him".  States thoughts are being implanted in his head.  He does not identify any specific person just references "they".  He is scattered during the conversation and is hard to follow. He appears to be responding to internal/external  stimuli.  He looks around the room at inappropriate times and talks under his breath as though he is talking to someone.  He endorses suicidal ideations.  States "doesn't everyone think about it I would do it quickly".  Endorses homicidal ideations but would not elaborate.  He is unable to state if he has access to any firearms/weapons.  Denies auditory/visual hallucinations.  However patient states he does hear constant snapping of fingers in his ear that does not go away.  Denies for collateral to be obtained.  States he does not trust anyone.  Patient reports admissions.  However unable to elaborate or describe the admissions.  He denies any current drug or alcohol use.  He is unable to clearly state.    PHQ 2-9:  Flowsheet Row Clinical Support from 12/07/2020 in [facility] ED from 12/20/2019 in [facility]  Thoughts that you would be better off dead, or of hurting yourself in some way Not at all Not at all  PHQ-9 Total Score 8 12       Flowsheet Row ED from 01/22/2021 in [facility] EM[facility]GENCY [facility]PARTMENT ED from 12/20/2019 in [facility]  C-SSRS RISK CATEGORY No Risk No Risk        Total Time spent with patient: 30 minutes  Musculoskeletal  Strength & Muscle Tone: within normal limits Gait & Station: normal Patient leans: N/A  Psychiatric Specialty Exam  Presentation General Appearance: Bizarre  Eye Contact:Fleeting  Speech:Clear and Coherent; Normal Rate  Speech Volume:Normal  Handedness:Right   Mood and Affect  Mood:Anxious  Affect:Congruent   Thought Process  Thought Processes:Disorganized  Descriptions of Associations:Tangential  Orientation:Full (Time, Place and Person)  Thought Content:Illogical; Paranoid Ideation; Scattered  Diagnosis of Schizophrenia or Schizoaffective disorder in past: No  Duration of Psychotic Symptoms: Greater than six  months  Hallucinations:Hallucinations: None  Ideas of Reference:Paranoia  Suicidal Thoughts:Suicidal Thoughts: Yes, Passive SI Passive Intent and/or Plan: Without Intent; With Plan  Homicidal Thoughts:Homicidal Thoughts: Yes, Passive HI Passive Intent and/or Plan: Without Intent; Without Plan   Sensorium  Memory:Immediate Poor; Recent Poor; Remote Poor  Judgment:Impaired  Insight:Shallow   Executive Functions  Concentration:Poor  Attention Span:Poor  Recall:Poor  Fund of Knowledge:Poor  Language:Good   Psychomotor Activity  Psychomotor Activity:Psychomotor Activity: Normal   Assets  Assets:Financial Resources/Insurance; Housing; Desire for Improvement; Leisure Time; Physical Health   Sleep  Sleep:Sleep: Poor Number of Hours of Sleep: 3   No data recorded  Physical Exam Vitals and nursing note reviewed.  Constitutional:      General: He is not in acute distress.    Appearance: Normal appearance. He is well-developed. He is not ill-appearing.  HENT:     Head: Normocephalic and atraumatic.  Eyes:     General:        Right eye: No discharge.        Left eye: No discharge.     Conjunctiva/sclera: Conjunctivae normal.  Cardiovascular:     Rate and Rhythm: Normal rate.  Pulmonary:     Effort: Pulmonary effort is normal. No respiratory distress.  Musculoskeletal:        General: Normal range of motion.     Cervical back: Normal range of motion.  Skin:    Coloration: Skin is not jaundiced or pale.  Neurological:     Mental Status: He is alert and oriented to person, place, and time.  Psychiatric:        Attention and Perception: He is inattentive.        Mood and Affect: Mood is anxious.        Speech: Speech is tangential.        Behavior: Behavior is cooperative.        Thought Content: Thought content is paranoid and delusional. Thought content includes homicidal and suicidal ideation. Thought content includes homicidal and suicidal plan.         Cognition and Memory: Cognition normal.   Review of Systems  Constitutional: Negative.   HENT: Negative.    Eyes: Negative.   Respiratory: Negative.    Cardiovascular: Negative.   Musculoskeletal: Negative.   Skin: Negative.   Neurological: Negative.   Psychiatric/Behavioral:  Positive for suicidal ideas. The patient is nervous/anxious.    Blood pressure 125/85, pulse 84, temperature 98.4 F (36.9 C), temperature source Oral, resp. rate 18, SpO2 100 %. There is no height or weight on file to calculate BMI.  Past Psychiatric History: ADHD, MDD, GAD  Is the patient at risk to self? Yes  Has the patient been a risk to self in the past 6 months? No .    Has the patient been a risk to self within the distant past? No   Is the patient a risk to others? Yes   Has the patient been a risk to others in the past 6 months? No   Has the patient been a risk to  others within the distant past? No   Past Medical History:  Past Medical History:  Diagnosis Date   Chronic pain syndrome    Heroin abuse (HCC)    Nerve root avulsion    Spinal cord injury at C5-C7 level without injury of spinal bone (HCC) 2011    Past Surgical History:  Procedure Laterality Date   nerve root repair      Family History: History reviewed. No pertinent family history.  Social History:  Social History   Socioeconomic History   Marital status: Single    Spouse name: Not on file   Number of children: Not on file   Years of education: Not on file   Highest education level: Not on file  Occupational History   Not on file  Tobacco Use   Smoking status: Some Days    Packs/day: 0.20    Years: 7.00    Pack years: 1.40    Types: Cigarettes   Smokeless tobacco: Never   Tobacco comments:    decreased smoking  Substance and Sexual Activity   Alcohol use: Yes    Alcohol/week: 0.0 standard drinks    Comment: occasional   Drug use: Yes    Comment: heroine   Sexual activity: Yes  Other Topics Concern   Not on  file  Social History Narrative   ** Merged History Encounter **       Social Determinants of Health   Financial Resource Strain: Not on file  Food Insecurity: Not on file  Transportation Needs: Not on file  Physical Activity: Not on file  Stress: Not on file  Social Connections: Not on file  Intimate Partner Violence: Not on file    SDOH:  SDOH Screenings   Alcohol Screen: Not on file  Depression (PHQ2-9): Medium Risk   PHQ-2 Score: 8  Financial Resource Strain: Not on file  Food Insecurity: Not on file  Housing: Not on file  Physical Activity: Not on file  Social Connections: Not on file  Stress: Not on file  Tobacco Use: High Risk   Smoking Tobacco Use: Some Days   Smokeless Tobacco Use: Never  Transportation Needs: Not on file    Last Labs:  Admission on 01/22/2021, Discharged on 01/23/2021  Component Date Value Ref Range Status   WBC 01/23/2021 8.3  4.0 - 10.5 K/uL Final   RBC 01/23/2021 4.82  4.22 - 5.81 MIL/uL Final   Hemoglobin 01/23/2021 15.7  13.0 - 17.0 g/dL Final   HCT 44/96/7591 44.3  39.0 - 52.0 % Final   MCV 01/23/2021 91.9  80.0 - 100.0 fL Final   MCH 01/23/2021 32.6  26.0 - 34.0 pg Final   MCHC 01/23/2021 35.4  30.0 - 36.0 g/dL Final   RDW 63/84/6659 12.1  11.5 - 15.5 % Final   Platelets 01/23/2021 175  150 - 400 K/uL Final   nRBC 01/23/2021 0.0  0.0 - 0.2 % Final   Neutrophils Relative % 01/23/2021 71  % Final   Neutro Abs 01/23/2021 5.9  1.7 - 7.7 K/uL Final   Lymphocytes Relative 01/23/2021 23  % Final   Lymphs Abs 01/23/2021 1.9  0.7 - 4.0 K/uL Final   Monocytes Relative 01/23/2021 5  % Final   Monocytes Absolute 01/23/2021 0.4  0.1 - 1.0 K/uL Final   Eosinophils Relative 01/23/2021 1  % Final   Eosinophils Absolute 01/23/2021 0.1  0.0 - 0.5 K/uL Final   Basophils Relative 01/23/2021 0  % Final   Basophils Absolute  01/23/2021 0.0  0.0 - 0.1 K/uL Final   Immature Granulocytes 01/23/2021 0  % Final   Abs Immature Granulocytes 01/23/2021 0.02   0.00 - 0.07 K/uL Final   Performed at [facility], [address], [place], [place] [address]   Sodium 01/23/2021 138  135 - 145 mmol/L Final   Potassium 01/23/2021 3.4 (A) 3.5 - 5.1 mmol/L Final   Chloride 01/23/2021 104  98 - 111 mmol/L Final   CO2 01/23/2021 25  22 - 32 mmol/L Final   Glucose, Bld 01/23/2021 88  70 - 99 mg/dL Final   Glucose reference range applies only to samples taken after fasting for at least 8 hours.   BUN 01/23/2021 8  6 - 20 mg/dL Final   Creatinine, Ser 01/23/2021 0.76  0.61 - 1.24 mg/dL Final   Calcium 17/00/1749 9.3  8.9 - 10.3 mg/dL Final   Total Protein 44/96/7591 6.5  6.5 - 8.1 g/dL Final   Albumin 63/84/6659 4.3  3.5 - 5.0 g/dL Final   AST 93/57/0177 118 (A) 15 - 41 U/L Final   ALT 01/23/2021 99 (A) 0 - 44 U/L Final   Alkaline Phosphatase 01/23/2021 44  38 - 126 U/L Final   Total Bilirubin 01/23/2021 0.7  0.3 - 1.2 mg/dL Final   GFR, Estimated 01/23/2021 >60  >60 mL/min Final   Comment: (NOTE) Calculated using the CKD-EPI Creatinine Equation (2021)    Anion gap 01/23/2021 9  5 - 15 Final   Performed at [facility], [address] [place], [place] [address]   Alcohol, Ethyl (B) 01/23/2021 <10  <10 mg/dL Final   Comment: (NOTE) Lowest detectable limit for serum alcohol is 10 mg/dL.  For medical purposes only. Performed at [facility], [address] [place], [place] [address]    Opiates 01/23/2021 NONE DETECTED  NONE DETECTED Final   Cocaine 01/23/2021 NONE DETECTED  NONE DETECTED Final   Benzodiazepines 01/23/2021 POSITIVE (A) NONE DETECTED Final   Amphetamines 01/23/2021 NONE DETECTED  NONE DETECTED Final   Tetrahydrocannabinol 01/23/2021 NONE DETECTED  NONE DETECTED Final   Barbiturates 01/23/2021 NONE DETECTED  NONE DETECTED Final   Comment: (NOTE) DRUG SCREEN FOR MEDICAL PURPOSES ONLY.  IF CONFIRMATION IS NEEDED FOR ANY PURPOSE, NOTIFY LAB WITHIN 5 DAYS.  LOWEST DETECTABLE LIMITS FOR URINE DRUG SCREEN Drug Class                      Cutoff (ng/mL) Amphetamine and metabolites    1000 Barbiturate and metabolites    200 Benzodiazepine                 200 Tricyclics and metabolites     300 Opiates and metabolites        300 Cocaine and metabolites        300 THC                            50 Performed at [facility], [address], [place], [place] [address]    SARS Coronavirus 2 by RT PCR 01/23/2021 NEGATIVE  NEGATIVE Final   Comment: (NOTE) SARS-CoV-2 target nucleic acids are NOT DETECTED.  The SARS-CoV-2 RNA is generally detectable in upper respiratory specimens during the acute phase of infection. The lowest concentration of SARS-CoV-2 viral copies this assay can detect is 138 copies/mL. A negative result does not preclude SARS-Cov-2 infection and should not be  used as the sole basis for treatment or other patient management decisions. A negative result may occur with  improper specimen collection/handling, submission of specimen other than nasopharyngeal swab, presence of viral mutation(s) within the areas targeted by this assay, and inadequate number of viral copies(<138 copies/mL). A negative result must be combined with clinical observations, patient history, and epidemiological information. The expected result is Negative.  Fact Sheet for Patients:  BloggerCourse.com  Fact Sheet for Healthcare Providers:  SeriousBroker.it  This test is no                          t yet approved or cleared by the [place] FDA and  has been authorized for detection and/or diagnosis of SARS-CoV-2 by FDA under an Emergency Use Authorization (EUA). This EUA will remain  in effect (meaning this test can be used) for the duration of the COVID-19 declaration under Section 564(b)(1) of the Act, 21 U.S.C.section 360bbb-3(b)(1), unless the authorization is terminated  or revoked sooner.       Influenza A by PCR 01/23/2021 NEGATIVE  NEGATIVE  Final   Influenza B by PCR 01/23/2021 NEGATIVE  NEGATIVE Final   Comment: (NOTE) The Xpert Xpress SARS-CoV-2/FLU/RSV plus assay is intended as an aid in the diagnosis of influenza from Nasopharyngeal swab specimens and should not be used as a sole basis for treatment. Nasal washings and aspirates are unacceptable for Xpert Xpress SARS-CoV-2/FLU/RSV testing.  Fact Sheet for Patients: BloggerCourse.com  Fact Sheet for Healthcare Providers: SeriousBroker.it  This test is not yet approved or cleared by the [place] FDA and has been authorized for detection and/or diagnosis of SARS-CoV-2 by FDA under an Emergency Use Authorization (EUA). This EUA will remain in effect (meaning this test can be used) for the duration of the COVID-19 declaration under Section 564(b)(1) of the Act, 21 U.S.C. section 360bbb-3(b)(1), unless the authorization is terminated or revoked.  Performed at [facility], [address], [place], [place] [address]     Allergies: Morphine and related and Ms contin [morphine sulfate]  PTA Medications: (Not in a hospital admission)   Medical Decision Making  Patient is disorganized, paranoid, and delusional.  He has endorsed SI and HI.  Patient will be recommended for inpatient psychiatric admission for stabilization.  Recommendations  Based on my evaluation the patient appears to have an emergency medical condition for which I recommend the patient be transferred to the emergency department for further evaluation.  Patient meets criteria for inpatient psychiatric admission.  [facility] was consulted bed review will be done after shift change.  Lab work ordered: CBC with differential, CMP, ethanol, A1c, hepatitis panel, HIV antibody, lipid panel, magnesium, RPR, TSH, U/A, UDS, GC/chlamydia, POC COVID and PCR COVID  EKG ordered  Ardis Hughs, NP 02/22/21  6:47 PM

## 2021-02-22 NOTE — BH Assessment (Signed)
Comprehensive Clinical Assessment (CCA) Note  02/22/2021 Jeffery Baldwin [id number]  Disposition: Per Vernard Gambles, NP inpatient treatment is recommended.  Disposition SW to pursue appropriate inpatient options.  The patient demonstrates the following risk factors for suicide: Chronic risk factors for suicide include: psychiatric disorder of MDD, anxiety and previous suicide attempts x1, although pt questions reality of whether this attempt happened . Acute risk factors for suicide include: family or marital conflict, unemployment, and loss (financial, interpersonal, professional). Protective factors for this patient include: positive therapeutic relationship. Considering these factors, the overall suicide risk at this point appears to be moderate. Patient is appropriate for outpatient follow up once stabilized.   Patient is a 30 year old male with a history of Major Depressive Disorder and anxiety(per [facility] records) who presents voluntarily to [facility] for assessment.  Patient reports he needs to be commited as he is feeling be is being "gaslighted, turtured and trust is broken."  Patient believes his devices have a "colonel root system embedded with malicious code" so he has gotten rid of devices.  Currently patient appears confused, as he questions reality, states he is replaying/reliving scenarios and life seems to be replaying even his visit here today.  He struggles to provide meaningful responses at this point, as he questions the questions being asked and responds with questions due to current confusion and paranoia. He endorses SI, stating he "can't keep doing this.  I'm tired."  He admits to a past attempt and then questions the reality of that attempt, believing it may not have happened.  He endorses HI, however then struggles to elaborate and ultimately has no intent or plan to harm others.   He denies SA hs and he denies AVH, however he appears to be responding to internal  stimuli at points, as evidenced by side whispers during assessment.  He states he recently left his aunt's house, where he's been staying, then mentions they "don't actually live here."  With his worsening paranoia and psychosis, patient has had difficulty sleeping and states he only sleeps well "when I'm alone.  I'm just not alone anymore(referencing those controlling his mind)."  Inpatient treatment has been recommended, and patient is in agreement with recommendation.    Chief Complaint: No chief complaint on file.  Visit Diagnosis: Major Depressive Disorder, recurrent, moderate                              Unspecified Psychosis  Flowsheet Row ED from 02/22/2021 in [facility] Clinical Support from 12/07/2020 in [facility] ED from 12/20/2019 in [facility]  Thoughts that you would be better off dead, or of hurting yourself in some way More than half the days Not at all Not at all  PHQ-9 Total Score 16 8 12       Flowsheet Row ED from 02/22/2021 in [facility] ED from 01/22/2021 in [facility] EMERGENCY DEPARTMENT ED from 12/20/2019 in [facility]  C-SSRS RISK CATEGORY Error: Q3, 4, or 5 should not be populated when Q2 is No No Risk No Risk      Risk - Moderate - score isn't populating  Standard suicide precautions with 2:1 monitoring  CCA Screening, Triage and Referral (STR)  Patient Reported Information How did you hear about [facility]? Self  What Is the Reason for Your Visit/Call Today? Patient reports  he needs to be commited as he is feeling be is being "gaslighted, turtured and trust broken."  Patient believes his devices have a "colonel root system embedded with malicious code" so he has gotten rid of devices....questions reality, states he is replaying/reliving scenarios and life seems to be replaying even his visit here today.  He  struggles to provide meaningful responses at this point, as he questions the questions being asked and responds with questions due to current confusion and paranoia. He endorses SI, stating he "can't keep doing this.  I'm tired."  He admits to a past attempt and then questions the reality of that attempt, believing it may not have happened.  He denies HI.  He denies AVH, however he appears to be responding to internal stimuli at points.  How Long Has This Been Causing You Problems? 1-6 months  What Do You Feel Would Help You the Most Today? Treatment for Depression or other mood problem   Have You Recently Had Any Thoughts About Hurting Yourself? Yes  Are You Planning to Commit Suicide/Harm Yourself At This time? No   Have you Recently Had Thoughts About Hurting Someone [name]? No  Are You Planning to Harm Someone at This Time? No  Explanation: No data recorded  Have You Used Any Alcohol or Drugs in the Past 24 Hours? No  How Long Ago Did You Use Drugs or Alcohol? No data recorded What Did You Use and How Much? No data recorded  Do You Currently Have a Therapist/Psychiatrist? Yes  Name of Therapist/Psychiatrist: Karen Kitchens, NP for med management - missed last appt - also, was seeing Adam for therapy   Have You Been Recently Discharged From Any Office Practice or Programs? No  Explanation of Discharge From Practice/Program: No data recorded    CCA Screening Triage Referral Assessment Type of Contact: Tele-Assessment  Telemedicine Service Delivery:   Is this Initial or Reassessment? Initial Assessment  Date Telepsych consult ordered in [facility]:  01/23/21  Time Telepsych consult ordered in [facility]:  0237  Location of Assessment: [facility] Assessment Services  Provider Location: [facility] Assessment Services   Collateral Involvement: None   Does Patient Have a Automotive engineer Guardian? No data recorded Name and Contact of Legal Guardian: No data recorded If Minor and Not  Living with Parent(s), Who has Custody? NA  Is CPS involved or ever been involved? Never  Is APS involved or ever been involved? Never   Patient Determined To Be At Risk for Harm To Self or Others Based on Review of Patient Reported Information or Presenting Complaint? No  Method: No data recorded Availability of Means: No data recorded Intent: No data recorded Notification Required: No data recorded Additional Information for Danger to Others Potential: No data recorded Additional Comments for Danger to Others Potential: No data recorded Are There Guns or Other Weapons in Your Home? No data recorded Types of Guns/Weapons: No data recorded Are These Weapons Safely Secured?                            No data recorded Who Could Verify You Are Able To Have These Secured: No data recorded Do You Have any Outstanding Charges, Pending Court Dates, Parole/Probation? No data recorded Contacted To Inform of Risk of Harm To Self or Others: Other: Comment [facility] staff aware of safety concerns)    Does Patient Present under Involuntary Commitment? No  IVC Papers Initial File Date: No  data recorded  [place] of Residence: [place]   Patient Currently Receiving the Following Services: Medication Management   Determination of Need: Urgent (48 hours)   Options For Referral: Inpatient Hospitalization; Intensive Outpatient Therapy     CCA Biopsychosocial Patient Reported Schizophrenia/Schizoaffective Diagnosis in Past: No   Strengths: Patient is motivated towards treatment   Mental Health Symptoms Depression:   Difficulty Concentrating; Fatigue; Irritability; Worthlessness   Duration of Depressive symptoms:  Duration of Depressive Symptoms: Greater than two weeks   Mania:   Racing thoughts   Anxiety:    Restlessness; Tension; Difficulty concentrating; Worrying   Psychosis:   Other negative symptoms; Delusions; Hallucinations (Pt appears paranoid and confused.)   Duration  of Psychotic symptoms:  Duration of Psychotic Symptoms: Greater than six months   Trauma:   None   Obsessions:   None   Compulsions:   None   Inattention:   Forgetful; Disorganized   Hyperactivity/Impulsivity:   N/A   Oppositional/Defiant Behaviors:   N/A   Emotional Irregularity:   None   Other Mood/Personality Symptoms:   NA    Mental Status Exam Appearance and self-care  Stature:   Average   Weight:   Average weight   Clothing:   Casual (Covered by blanket)   Grooming:   Normal   Cosmetic use:   None   Posture/gait:   Normal   Motor activity:   Not Remarkable   Sensorium  Attention:   Confused; Distractible   Concentration:   Variable   Orientation:   X5   Recall/memory:   Defective in Short-term; Defective in Remote; Defective in Recent   Affect and Mood  Affect:   Anxious; Negative   Mood:   Anxious; Negative; Depressed   Relating  Eye contact:   Normal   Facial expression:   Anxious; Fearful   Attitude toward examiner:   Cooperative; Guarded   Thought and Language  Speech flow:  Paucity   Thought content:   Delusions; Suspicious   Preoccupation:   None   Hallucinations:   Auditory   Organization:  No data recorded  [employer] of Knowledge:   Average   Intelligence:   Average   Abstraction:   Concrete   Judgement:   Impaired   Reality Testing:   Distorted   Insight:   Lacking   Decision Making:   Confused   Social Functioning  Social Maturity:   Isolates   Social Judgement:   Normal   Stress  Stressors:   Family conflict   Coping Ability:   Overwhelmed   Skill Deficits:   Responsibility; Self-control; Interpersonal; Decision making; Communication   Supports:   Support needed     Religion: Religion/Spirituality Are You A Religious Person?: No  Leisure/Recreation: Leisure / Recreation Do You Have Hobbies?: No  Exercise/Diet: Exercise/Diet Do You  Exercise?: No Have You Gained or Lost A Significant Amount of Weight in the Past Six Months?: No Do You Follow a Special Diet?: No Do You Have Any Trouble Sleeping?: Yes Explanation of Sleeping Difficulties: difficulty sleeping d/t paranoia   CCA Employment/Education Employment/Work Situation:    Education:     CCA Family/Childhood History Family and Relationship History: Family history Marital status: Single Does patient have children?:  (UTA - pt has stated he does not know in past assessments)  Childhood History:  Childhood History By whom was/is the patient raised?:  (UTA d/t pt's current mental state) Did patient suffer any verbal/emotional/physical/sexual abuse as a child?:  (  UTA d/t pt's current mental state) Did patient suffer from severe childhood neglect?:  (UTA d/t pt's current mental state) Has patient ever been sexually abused/assaulted/raped as an adolescent or adult?:  (UTA d/t pt's current mental state) Was the patient ever a victim of a crime or a disaster?: No Witnessed domestic violence?: No (Pt states he does not know.) Has patient been affected by domestic violence as an adult?: No (Pt states he does not know.)  Child/Adolescent Assessment:     CCA Substance Use Alcohol/Drug Use: Alcohol / Drug Use Pain Medications: See MAR Prescriptions: See MAR Over the Counter: See MAR History of alcohol / drug use?: Yes (Pt denies substance use but medical record indicates a history of using unprescribed benzodiazepines.) Longest period of sobriety (when/how long): NA                         ASAM's:  Six Dimensions of Multidimensional Assessment  Dimension 1:  Acute Intoxication and/or Withdrawal Potential:      Dimension 2:  Biomedical Conditions and Complications:      Dimension 3:  Emotional, Behavioral, or Cognitive Conditions and Complications:     Dimension 4:  Readiness to Change:     Dimension 5:  Relapse, Continued use, or Continued  Problem Potential:     Dimension 6:  Recovery/Living Environment:     ASAM Severity Score:    ASAM Recommended Level of Treatment:     Substance use Disorder (SUD)    Recommendations for Services/Supports/Treatments:    Discharge Disposition:    DSM5 Diagnoses: Patient Active Problem List   Diagnosis Date Noted   Anxiety    Paranoia (HCC) 11/15/2020   Attention deficit hyperactivity disorder (ADHD), predominantly inattentive type 01/21/2020   Mild episode of recurrent major depressive disorder (HCC) 01/21/2020   Paralysis of right upper extremity (HCC)    Chest pain    IVDU (intravenous drug user)    Fever 11/13/2014   Encounter for chronic pain management 06/29/2014   Generalized anxiety disorder 05/23/2014   Cellulitis and abscess of hand    Essential hypertension, benign    Cellulitis of right hand 04/27/2014   Cellulitis of right upper extremity 04/27/2014   Gastroenteritis 04/10/2014   Right arm pain 04/10/2014   Anisocoria 01/19/2014   Left leg injury 12/31/2013   Left hand tendonitis 09/09/2013   Counseling and coordination of care 05/12/2013   Closed fracture of unspecified part of upper end of humerus 01/29/2013   Obesity 05/17/2012   Elevated BP 02/16/2012   Chronic pain syndrome 02/16/2012   Social anxiety disorder 08/25/2011   Former smoker 09/13/2009   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

## 2021-02-22 NOTE — ED Notes (Signed)
Pt sleeping at present, no distress noted.  Monitoring for safety. 

## 2021-02-23 ENCOUNTER — Encounter (HOSPITAL_COMMUNITY): Payer: Self-pay | Admitting: Emergency Medicine

## 2021-02-23 ENCOUNTER — Inpatient Hospital Stay (HOSPITAL_COMMUNITY)
Admission: AD | Admit: 2021-02-23 | Discharge: 2021-03-10 | DRG: 885 | Disposition: A | Discharge status: Home / Self Care | Payer: Federal, State, Local not specified - Other | Source: Intra-hospital | Attending: Emergency Medicine | Admitting: Emergency Medicine

## 2021-02-23 ENCOUNTER — Encounter (HOSPITAL_COMMUNITY): Payer: Self-pay

## 2021-02-23 DIAGNOSIS — F1290 Cannabis use, unspecified, uncomplicated: Secondary | ICD-10-CM | POA: Diagnosis present

## 2021-02-23 DIAGNOSIS — F330 Major depressive disorder, recurrent, mild: Secondary | ICD-10-CM | POA: Diagnosis not present

## 2021-02-23 DIAGNOSIS — Z634 Disappearance and death of family member: Secondary | ICD-10-CM | POA: Diagnosis not present

## 2021-02-23 DIAGNOSIS — Z5900 Homelessness unspecified: Secondary | ICD-10-CM | POA: Diagnosis not present

## 2021-02-23 DIAGNOSIS — G4700 Insomnia, unspecified: Secondary | ICD-10-CM | POA: Diagnosis present

## 2021-02-23 DIAGNOSIS — Z638 Other specified problems related to primary support group: Secondary | ICD-10-CM | POA: Diagnosis not present

## 2021-02-23 DIAGNOSIS — F17200 Nicotine dependence, unspecified, uncomplicated: Secondary | ICD-10-CM | POA: Diagnosis present

## 2021-02-23 DIAGNOSIS — Z20822 Contact with and (suspected) exposure to covid-19: Secondary | ICD-10-CM | POA: Diagnosis present

## 2021-02-23 DIAGNOSIS — G894 Chronic pain syndrome: Secondary | ICD-10-CM | POA: Diagnosis present

## 2021-02-23 DIAGNOSIS — R45851 Suicidal ideations: Secondary | ICD-10-CM | POA: Diagnosis present

## 2021-02-23 DIAGNOSIS — Z9151 Personal history of suicidal behavior: Secondary | ICD-10-CM | POA: Diagnosis not present

## 2021-02-23 DIAGNOSIS — R61 Generalized hyperhidrosis: Secondary | ICD-10-CM | POA: Diagnosis not present

## 2021-02-23 DIAGNOSIS — F29 Unspecified psychosis not due to a substance or known physiological condition: Principal | ICD-10-CM | POA: Diagnosis present

## 2021-02-23 DIAGNOSIS — Z79899 Other long term (current) drug therapy: Secondary | ICD-10-CM | POA: Diagnosis not present

## 2021-02-23 DIAGNOSIS — F4010 Social phobia, unspecified: Secondary | ICD-10-CM | POA: Diagnosis present

## 2021-02-23 DIAGNOSIS — F1990 Other psychoactive substance use, unspecified, uncomplicated: Secondary | ICD-10-CM | POA: Insufficient documentation

## 2021-02-23 DIAGNOSIS — J301 Allergic rhinitis due to pollen: Secondary | ICD-10-CM | POA: Diagnosis present

## 2021-02-23 DIAGNOSIS — M79601 Pain in right arm: Secondary | ICD-10-CM | POA: Diagnosis present

## 2021-02-23 DIAGNOSIS — F411 Generalized anxiety disorder: Secondary | ICD-10-CM | POA: Diagnosis present

## 2021-02-23 DIAGNOSIS — F22 Delusional disorders: Secondary | ICD-10-CM | POA: Diagnosis not present

## 2021-02-23 DIAGNOSIS — F4312 Post-traumatic stress disorder, chronic: Secondary | ICD-10-CM | POA: Diagnosis present

## 2021-02-23 DIAGNOSIS — B1920 Unspecified viral hepatitis C without hepatic coma: Secondary | ICD-10-CM | POA: Diagnosis present

## 2021-02-23 DIAGNOSIS — G8321 Monoplegia of upper limb affecting right dominant side: Secondary | ICD-10-CM | POA: Diagnosis present

## 2021-02-23 LAB — HEPATITIS PANEL, ACUTE
HCV Ab: REACTIVE — AB
Hep A IgM: NONREACTIVE
Hep B C IgM: NONREACTIVE
Hepatitis B Surface Ag: NONREACTIVE

## 2021-02-23 LAB — TSH: TSH: 0.503 u[IU]/mL (ref 0.350–4.500)

## 2021-02-23 LAB — RPR: RPR Ser Ql: NONREACTIVE

## 2021-02-23 MED ORDER — TRAZODONE HCL 50 MG PO TABS
50.0000 mg | ORAL_TABLET | Freq: Every evening | ORAL | Status: DC | PRN
Start: 2021-02-23 — End: 2021-02-25
  Administered 2021-02-24: 50 mg via ORAL
  Filled 2021-02-23: qty 1

## 2021-02-23 MED ORDER — HYDROXYZINE HCL 25 MG PO TABS
25.0000 mg | ORAL_TABLET | Freq: Three times a day (TID) | ORAL | Status: DC | PRN
  Administered 2021-02-23 – 2021-02-24 (×3): 25 mg via ORAL
  Filled 2021-02-23 (×3): qty 1

## 2021-02-23 MED ORDER — NICOTINE POLACRILEX 2 MG MT GUM
2.0000 mg | CHEWING_GUM | OROMUCOSAL | Status: DC | PRN
  Administered 2021-02-23 – 2021-03-10 (×51): 2 mg via ORAL
  Filled 2021-02-23 (×20): qty 1
  Filled 2021-02-23: qty 10
  Filled 2021-02-23 (×3): qty 1

## 2021-02-23 MED ORDER — ADULT MULTIVITAMIN W/MINERALS CH
1.0000 | ORAL_TABLET | Freq: Every day | ORAL | Status: DC
  Administered 2021-02-23 – 2021-03-10 (×16): 1 via ORAL
  Filled 2021-02-23 (×19): qty 1

## 2021-02-23 MED ORDER — ZIPRASIDONE MESYLATE 20 MG IM SOLR: 20.0000 mg | INTRAMUSCULAR | Status: DC | PRN

## 2021-02-23 MED ORDER — THIAMINE HCL 100 MG PO TABS
100.0000 mg | ORAL_TABLET | Freq: Every day | ORAL | Status: DC
  Administered 2021-02-24 – 2021-03-09 (×14): 100 mg via ORAL
  Filled 2021-02-23 (×18): qty 1

## 2021-02-23 MED ORDER — OLANZAPINE 5 MG PO TABS
5.0000 mg | ORAL_TABLET | Freq: Every day | ORAL | Status: DC
  Filled 2021-02-23 (×2): qty 1

## 2021-02-23 MED ORDER — OLANZAPINE 5 MG PO TBDP
5.0000 mg | ORAL_TABLET | Freq: Once | ORAL | Status: AC
  Administered 2021-02-23: 5 mg via ORAL
  Filled 2021-02-23 (×2): qty 1

## 2021-02-23 MED ORDER — MAGNESIUM HYDROXIDE 400 MG/5ML PO SUSP: 30.0000 mL | Freq: Every day | ORAL | Status: DC | PRN

## 2021-02-23 MED ORDER — LORAZEPAM 1 MG PO TABS: 1.0000 mg | ORAL_TABLET | ORAL | Status: DC | PRN

## 2021-02-23 MED ORDER — OLANZAPINE 5 MG PO TBDP: 5.0000 mg | ORAL_TABLET | Freq: Three times a day (TID) | ORAL | Status: DC | PRN

## 2021-02-23 MED ORDER — LOPERAMIDE HCL 2 MG PO CAPS: 2.0000 mg | ORAL_CAPSULE | ORAL | Status: AC | PRN

## 2021-02-23 MED ORDER — ONDANSETRON 4 MG PO TBDP: 4.0000 mg | ORAL_TABLET | Freq: Four times a day (QID) | ORAL | Status: AC | PRN

## 2021-02-23 MED ORDER — LORAZEPAM 1 MG PO TABS: 1.0000 mg | ORAL_TABLET | Freq: Four times a day (QID) | ORAL | Status: AC | PRN

## 2021-02-23 MED ORDER — ACETAMINOPHEN 325 MG PO TABS
650.0000 mg | ORAL_TABLET | Freq: Four times a day (QID) | ORAL | Status: DC | PRN
  Administered 2021-02-23 – 2021-03-10 (×8): 650 mg via ORAL
  Filled 2021-02-23 (×8): qty 2

## 2021-02-23 MED ORDER — ALUM & MAG HYDROXIDE-SIMETH 200-200-20 MG/5ML PO SUSP: 30.0000 mL | ORAL | Status: DC | PRN

## 2021-02-23 MED ORDER — OLANZAPINE 10 MG PO TBDP
10.0000 mg | ORAL_TABLET | Freq: Every day | ORAL | Status: DC
  Administered 2021-02-23: 10 mg via ORAL
  Filled 2021-02-23 (×2): qty 1

## 2021-02-23 NOTE — [facility] Suicide Risk Assessment (Signed)
[facility] Admission Suicide Risk Assessment   Nursing information obtained from:  Patient Demographic factors:  Male, Caucasian Current Mental Status:  Suicidal ideation indicated by patient Loss Factors:  Financial problems / change in socioeconomic status Historical Factors:  Impulsivity Risk Reduction Factors:  Living with another person, especially a relative  Total Time spent with patient: 50 minutes Principal Problem: Psychosis, 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[place]) Diagnosis:  Principal Problem:   Psychosis, 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[place]) Active Problems:   Generalized anxiety disorder   Mild episode of recurrent major depressive disorder ([place])   Paranoia ([place])   Substance use disorder  Subjective Data: Medical record reviewed.  Patient's case discussed in detail with nursing staff and members of the treatment team.  I met with and evaluated the patient on the unit today.  Jeffery Baldwin is a 30 year old male with history of prior treatment for ADHD, MDD, GAD, paranoia and history of substance abuse including remote history of heroin use disorder who presented to [facility] saying he needed to be committed due to feeling he is being "gaslighted, tortured and trust broken" and presenting with disorganized thought processes, paranoid delusions, trouble sleeping and suicidal ideation.  Patient made statements about malicious codes having been embedded on his electronic devices and reported that he had been replaying/reliving scenarios or events from his life.  Staff who evaluated him at [facility] documented that he appeared to be responding to internal stimuli although he denied AH or VH at that time.  Patient reportedly was given olanzapine 5 mg, trazodone 50 mg and Vistaril 25 mg last night and was transferred to [facility] early this morning for further evaluation and treatment.  On interview with me today, patient is guarded, anxious and disorganized with constricted affect.  He is a poor historian.  Patient  says that he feels like he is being psychologically tortured.  He reports he has been living some of the same events over again for about the past 20 months and notes that he is seeing the same clouds, the same weather, having the same conversations, etc.  Patient tells me that he feels like he is moving back in time, is living out other people's experiences after he has conversations with them and feels outcomes in his life are being controlled.  He reports paranoid concerns that he is being watched and that his phone and accounts have been hacked.  Patient states he believes that he is being targeted "by Mexicans" for financial gain and possibly other reasons.  He reports belief that he has been exposed to scopolamine in recent weeks "because it makes you open to suggestion" and also believes he was being given an antidepressant prior to admission without his knowledge.  Patient endorses auditory hallucinations (of people repeating what he says), thought broadcasting, anxiety, trouble focusing, getting sidetracked, insomnia, sad mood, decreased appetite with weight loss, nonspecific thoughts of harming others (no target or plan), and worsening nonspecific suicidal ideation due to feeling overwhelmed by his recent experiences (no intent or plan).  Patient denies thoughts of harming himself in the hospital.  Patient denies taking any psychiatric medications prior to admission.  Patient says that he drank some alcohol prior to admission but denies consistent or frequent alcohol use or history of withdrawal symptoms.  Patient reports using marijuana on the day prior to admission but denies regular use.  He reports use of delta 8 and CBD in the past but not in the last 1 or 2 months.  He has used cocaine  in the past but denies use in recent months.  Patient says that he has a remote history of IV drug use of heroin but has not been using heroin recently.  He does report taking tramadol and Klonopin that are not  prescribed for him during the month prior to current hospitalization.  Patient has difficulty quantifying the amounts of tramadol and Klonopin taken.  He estimates that he was taking between 2 and 4 Klonopin pills most days and says they are the smallest available dose.  He states that he was taking up to 5 pills of tramadol per day.  Patient denies current diaphoresis, tremor, nausea, vomiting, increased heart rate, diarrhea, pain or other withdrawal symptoms.  Patient says he is uncertain whether he was seeing a psychiatrist in recent months.  Chart review indicates that patient was seen by Burt Ek with most recent visit on 12/07/2020 for medication management for social anxiety, substance use and generalized anxiety.  He was prescribed BuSpar and Strattera but was not adherent to his outpatient medication regimen.  Outpatient clinic notes also document that patient was taking Klonopin and tramadol which he obtained from a friend and has a history of misusing Roxicodone.  At the July outpatient visit patient made paranoid statements about his phone being tapped and being gaslighted but declined to start an antipsychotic.  Patient denies prior history of inpatient psychiatric hospitalization.  He says he attempted suicide once about 5 years ago by hanging himself in the garage but changed his mind and did not follow through with it.  Patient says he does not know whether he has been given a psychiatric diagnosis in the past.  Patient says he has paralysis of right arm resulting from a torn nerve roots in a football accident in October 2010.  He denies any history of seizure, concussion, asthma, diabetes or other medical problems.  Patient says that there are members of his family with mental health problems related to trauma.  He says there are some family members with drug and alcohol problems.  Patient says there is no family history of suicide.  Continued Clinical Symptoms:  Alcohol Use  Disorder Identification Test Final Score (AUDIT): 0 The "Alcohol Use Disorders Identification Test", Guidelines for Use in Primary Care, Second Edition.  World Pharmacologist [facility]). Score between 0-7:  no or low risk or alcohol related problems. Score between 8-15:  moderate risk of alcohol related problems. Score between 16-19:  high risk of alcohol related problems. Score 20 or above:  warrants further diagnostic evaluation for alcohol dependence and treatment.   CLINICAL FACTORS:   Severe Anxiety and/or Agitation Depression:   Delusional Insomnia Alcohol/Substance Abuse/Dependencies Currently Psychotic Previous Psychiatric Diagnoses and Treatments   Musculoskeletal: Strength & Muscle Tone:  Decreased muscle tone of right upper extremity and right upper extremity paralysis Gait & Station:  Not tested; patient in bed Patient leans: N/A  Psychiatric Specialty Exam:  Presentation  General Appearance: Disheveled  Eye Contact:Fair  Speech:Clear and Coherent; Normal Rate  Speech Volume:Normal  Handedness:Right   Mood and Affect  Mood:Anxious; Dysphoric  Affect:Congruent   Thought Process  Thought Processes:Disorganized  Descriptions of Associations:Tangential  Orientation:Full (Time, Place and Person)  Thought Content:Illogical; Paranoid Ideation; Tangential; Delusions  History of Schizophrenia/Schizoaffective disorder:No  Duration of Psychotic Symptoms:Greater than six months  Hallucinations:Hallucinations: Auditory Description of Auditory Hallucinations: Voices of people repeating what I say  Ideas of Reference:Paranoia  Suicidal Thoughts:Suicidal Thoughts: Yes, Passive SI Passive Intent and/or Plan: Without Intent; Without Plan  Homicidal Thoughts:Homicidal Thoughts: Yes, Passive HI Passive Intent and/or Plan: Without Intent; Without Plan   Sensorium  Memory:Immediate Poor; Recent Poor; Remote  Poor  Judgment:Impaired  Insight:Shallow   Executive Functions  Concentration:Fair  Attention Span:Fair  [place]  Language:Good   Psychomotor Activity  Psychomotor Activity:Psychomotor Activity: Normal   Assets  Assets:Desire for Improvement; Housing   Sleep  Sleep:Sleep: Poor Number of Hours of Sleep: 2    Physical Exam: Physical Exam Vitals and nursing note reviewed.  Constitutional:      General: He is not in acute distress.    Appearance: Normal appearance. He is not diaphoretic.  HENT:     Head: Normocephalic and atraumatic.  Cardiovascular:     Rate and Rhythm: Normal rate.  Pulmonary:     Effort: Pulmonary effort is normal.  Neurological:     General: No focal deficit present.     Mental Status: He is alert and oriented to person, place, and time.   Review of Systems  Constitutional:  Negative for chills, diaphoresis and fever.  HENT:  Negative for sore throat.   Respiratory:  Negative for cough and shortness of breath.   Cardiovascular:  Negative for chest pain and palpitations.  Gastrointestinal:  Negative for constipation, diarrhea, nausea and vomiting.  Genitourinary:  Negative for dysuria.  Musculoskeletal:  Negative for myalgias.  Skin:  Negative for rash.  Neurological:  Positive for focal weakness. Negative for dizziness, seizures and headaches.       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 The patient is nervous/anxious and has insomnia.   All other systems reviewed and are negative. Blood pressure 135/88, pulse 82, temperature 97.9 F (36.6 C), temperature source Oral, resp. rate 16, height 5' 11"  (1.803 m), weight 64.6 kg, SpO2 97 %. Body mass index is 19.87 kg/m.   COGNITIVE FEATURES THAT CONTRIBUTE TO RISK:  Loss of executive function and Thought constriction  (tunnel vision)    SUICIDE RISK:   Severe:  Frequent, intense, and enduring suicidal ideation, specific plan, no subjective intent, but some objective markers of intent (i.e., choice of lethal method), the method is accessible, some limited preparatory behavior, evidence of impaired self-control, severe dysphoria/symptomatology, multiple risk factors present, and few if any protective factors, particularly a lack of social support.  PLAN OF CARE: The patient is a 30 year old male with prior diagnoses of ADHD, anxiety, MDD, substance abuse and history of paranoia who was admitted with worsening psychotic symptoms and suicidal ideation in the context of substance use (THC, tramadol, Klonopin).  Differential diagnosis includes substance-induced psychotic disorder versus primary psychotic disorder versus mood disorder with psychotic features.  Patient has been admitted to the 500 inpatient unit for crisis stabilization and treatment.  Continue 15-minute observation level.  Encouraged participation in group therapy and therapeutic milieu.  Available lab results reviewed.  CMP showed BUN less than 5, albumin 5.3, ALT 58 and otherwise WNL.  Lipid profile was WNL.  CBC and differential showed hemoglobin 17.5 and otherwise WNL.  Hemoglobin A1c was 4.9.  TSH was 0.503.  RPR was nonreactive.  Influenza A, influenza B and coronavirus testing were negative.  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  HIV screen was nonreactive.  BAL was <10 and urine tox cream was positive for marijuana.  EKG performed evening of 02/22/2021 showed sinus bradycardia,  ventricular rate 58 and QT/QTc 402/394.  Zyprexa has been initiated to treat psychotic symptoms.  Patient received Zyprexa 5 mg this morning and will receive Zyprexa 10 mg at bedtime tonight.  Anticipate further upward titration.  Patient is not currently reporting or displaying signs or symptoms of withdrawal from benzodiazepines, alcohol  or opiates.  He has been started on CIWA protocol with comfort meds and lorazepam for CIWA scores>10 to cover for any possible benzodiazepine withdrawal.  Will defer initiation of COWS protocol for now.  We will continue to monitor for any signs or symptoms of substance withdrawal.  Agitation protocol has been ordered.  See MAR for additional details.  Estimated length of stay 5 to 7 days.  Patient will need referral to PCP or ID clinic for follow-up regarding hep C positive test.  Patient would benefit from participation in substance abuse treatment program after discharge if he is organized enough to attend and receptive to attending at that time.  I certify that inpatient services furnished can reasonably be expected to improve the patient's condition.   Arthor Captain, MD 02/23/2021, 2:32 PM

## 2021-02-23 NOTE — ED Notes (Signed)
Report called to [employer], [facility], rm [phone].  Librarian, academic.

## 2021-02-23 NOTE — ED Provider Notes (Signed)
FBC/OBS ASAP Discharge Summary  Date and Time: 02/23/2021 12:14 AM  Name: Jeffery Baldwin  MRN:  [id number]   Discharge Diagnoses:  Final diagnoses:  Psychosis, unspecified psychosis type [facility])    Subjective: Per HPI from Vernard Gambles 02/22/21 [facility] Admission H&P:   "Jeffery Baldwin, 30 yo male patient presented to [facility] as a walk in alone with complaints of "I cannot get control of my thoughts".  Per chart review patient follows up with Gretchen Short, NP.  His psychiatric history is ADHD, MDD and GAD.  Per chart review he takes BuSpar 10 mg tablet 3 times daily.  Patient is not a good historian.  Per Chart Review patient has been assessed by psychiatry on  01/23/2021 and 11/15/2020 for similar symptoms.  It appears patient condition has decompensated since that time.   Jeffery Baldwin, 30 y.o., male patient seen face to face by this provider, consulted with Dr. Bronwen Betters; and chart reviewed on 02/22/21.   On evaluation Jeffery Baldwin reports this he is living life on repeat each day as if he is in a movie. During evaluation Jeffery Baldwin is in sitting position in no acute distress.  He makes fleeting eye contact.  He is fairly groomed.  He is anxious.  His speech is clear, coherent, normal tone.  He is hyperverbal.  He is alert and oriented x4.  He is cooperative but has to be redirected in the conversation.  His thought process is disorganized.  He is unable to formulate appropriate responses to questions that are being asked.  He endorses paranoia and delusional thought.  He believes "a colonel root system has been embedded with malicious codes and his phone, TV, and other electric devices.  He no longer uses any electric devices.  States if he were to use the devices that "people could control him".  States thoughts are being implanted in his head.  He does not identify any specific person just references "they".  He is scattered during the conversation and is hard to follow. He appears to be  responding to internal/external stimuli.  He looks around the room at inappropriate times and talks under his breath as though he is talking to someone.  He endorses suicidal ideations.  States "doesn't everyone think about it I would do it quickly".  Endorses homicidal ideations but would not elaborate.  He is unable to state if he has access to any firearms/weapons.  Denies auditory/visual hallucinations.  However patient states he does hear constant snapping of fingers in his ear that does not go away.   Denies for collateral to be obtained.  States he does not trust anyone.  Patient reports admissions.  However unable to elaborate or describe the admissions.  He denies any current drug or alcohol use.  He is unable to clearly state."  Stay Summary: Patient presented to the [facility] behavioral health urgent care [facility]) on 02/22/2021 unaccompanied as a voluntary walk-in.  Patient was evaluated by TTS and [facility] provider and upon evaluation, inpatient psychiatric treatment was recommended for the patient.  Patient had admission labs/tests of PCR COVID, EKG and labs done (including blood work and urine) at [facility] and was admitted to [facility] continuous assessment for further stabilization and treatment while waiting for placement for inpatient psychiatric treatment.  Patient's PCR COVID test was negative. Patient was accepted to [facility] for inpatient psychiatric treatment.  Will transfer the patient to [facility] to begin inpatient psychiatric treatment.  Total Time spent with  patient: 30 minutes  Past Psychiatric History: MDD, GAD, ADHD.  Per chart review, it appears that patient has seen Dr. Toy Cookey, NP for outpatient psychotropic medication management in the past and it appears that patient's last documented outpatient psychiatry visit was on 12/07/2020.  At that time of 12/07/2020 visit, it was documented the patient was restarting BuSpar 10 mg p.o. 3 times daily for MDD and GAD.  Past Medical History:  Past  Medical History:  Diagnosis Date   Chronic pain syndrome    Heroin abuse (HCC)    Nerve root avulsion    Spinal cord injury at C5-C7 level without injury of spinal bone (HCC) 2011    Past Surgical History:  Procedure Laterality Date   nerve root repair     Family History: History reviewed. No pertinent family history. Family Psychiatric History: None documented or reported.  Social History:  Social History   Substance and Sexual Activity  Alcohol Use Yes   Alcohol/week: 0.0 standard drinks   Comment: occasional     Social History   Substance and Sexual Activity  Drug Use Yes   Comment: heroine    Social History   Socioeconomic History   Marital status: Single    Spouse name: Not on file   Number of children: Not on file   Years of education: Not on file   Highest education level: Not on file  Occupational History   Not on file  Tobacco Use   Smoking status: Some Days    Packs/day: 0.20    Years: 7.00    Pack years: 1.40    Types: Cigarettes   Smokeless tobacco: Never   Tobacco comments:    decreased smoking  Substance and Sexual Activity   Alcohol use: Yes    Alcohol/week: 0.0 standard drinks    Comment: occasional   Drug use: Yes    Comment: heroine   Sexual activity: Yes  Other Topics Concern   Not on file  Social History Narrative   ** Merged History Encounter **       Social Determinants of Health   Financial Resource Strain: Not on file  Food Insecurity: Not on file  Transportation Needs: Not on file  Physical Activity: Not on file  Stress: Not on file  Social Connections: Not on file   SDOH:  SDOH Screenings   Alcohol Screen: Not on file  Depression (PHQ2-9): Medium Risk   PHQ-2 Score: 16  Financial Resource Strain: Not on file  Food Insecurity: Not on file  Housing: Not on file  Physical Activity: Not on file  Social Connections: Not on file  Stress: Not on file  Tobacco Use: High Risk   Smoking Tobacco Use: Some Days    Smokeless Tobacco Use: Never  Transportation Needs: Not on file    Tobacco Cessation:  Prescription not provided because: Patient is being transferred to a facility where tobacco cessation products can be offered/prescribed for the patient.  Current Medications:  Current Facility-Administered Medications  Medication Dose Route Frequency Provider Last Rate Last Admin   acetaminophen (TYLENOL) tablet 650 mg  650 mg Oral Q6H PRN Ardis Hughs, NP       alum & mag hydroxide-simeth (MAALOX/MYLANTA) 200-200-20 MG/5ML suspension 30 mL  30 mL Oral Q4H PRN Ardis Hughs, NP       hydrOXYzine (ATARAX/VISTARIL) tablet 25 mg  25 mg Oral TID PRN Ardis Hughs, NP   25 mg at 02/22/21 2110   magnesium hydroxide (MILK  OF MAGNESIA) suspension 30 mL  30 mL Oral Daily PRN Ardis Hughs, NP       OLANZapine (ZYPREXA) tablet 5 mg  5 mg Oral QHS Vernard Gambles H, NP   5 mg at 02/22/21 2110   traZODone (DESYREL) tablet 50 mg  50 mg Oral QHS PRN Ardis Hughs, NP   50 mg at 02/22/21 2110   Current Outpatient Medications  Medication Sig Dispense Refill   Aspirin-Acetaminophen-Caffeine (GOODYS EXTRA STRENGTH PO) Take 1 packet by mouth daily as needed (headache/pain).     busPIRone (BUSPAR) 10 MG tablet Take 1 tablet (10 mg total) by mouth 3 (three) times daily. (Patient not taking: No sig reported) 90 tablet 2    PTA Medications: (Not in a hospital admission)   Musculoskeletal  Strength & Muscle Tone: within normal limits Gait & Station: normal Patient leans: N/A  Psychiatric Specialty Exam  Per Vernard Gambles 02/22/21 PSE:  Presentation  General Appearance: Bizarre  Eye Contact:Fleeting  Speech:Clear and Coherent; Normal Rate  Speech Volume:Normal  Handedness:Right   Mood and Affect  Mood:Anxious  Affect:Congruent   Thought Process  Thought Processes:Disorganized  Descriptions of Associations:Tangential  Orientation:Full (Time, Place and Person)  Thought  Content:Illogical; Paranoid Ideation; Scattered  Diagnosis of Schizophrenia or Schizoaffective disorder in past: No  Duration of Psychotic Symptoms: Greater than six months   Hallucinations:Hallucinations: None  Ideas of Reference:Paranoia  Suicidal Thoughts:Suicidal Thoughts: Yes, Passive SI Passive Intent and/or Plan: Without Intent; With Plan  Homicidal Thoughts:Homicidal Thoughts: Yes, Passive HI Passive Intent and/or Plan: Without Intent; Without Plan   Sensorium  Memory:Immediate Poor; Recent Poor; Remote Poor  Judgment:Impaired  Insight:Shallow   Executive Functions  Concentration:Poor  Attention Span:Poor  Recall:Poor  Fund of Knowledge:Poor  Language:Good   Psychomotor Activity  Psychomotor Activity:Psychomotor Activity: Normal   Assets  Assets:Financial Resources/Insurance; Housing; Desire for Improvement; Leisure Time; Physical Health   Sleep  Sleep:Sleep: Poor Number of Hours of Sleep: 3   No data recorded  Physical Exam  Physical Exam Vitals reviewed.  Constitutional:      General: He is not in acute distress.    Appearance: He is not ill-appearing, toxic-appearing or diaphoretic.  HENT:     Head: Normocephalic and atraumatic.     Right Ear: External ear normal.     Left Ear: External ear normal.     Nose: Nose normal.  Eyes:     General:        Right eye: No discharge.        Left eye: No discharge.     Conjunctiva/sclera: Conjunctivae normal.  Cardiovascular:     Rate and Rhythm: Normal rate.  Pulmonary:     Effort: Pulmonary effort is normal. No respiratory distress.  Musculoskeletal:        General: Normal range of motion.     Cervical back: Normal range of motion.  Neurological:     General: No focal deficit present.     Mental Status: He is alert and oriented to person, place, and time.     Comments: No tremor noted.    Review of Systems  Constitutional:  Negative for chills, diaphoresis, fever, malaise/fatigue and  weight loss.  HENT:  Negative for congestion.   Respiratory:  Negative for cough and shortness of breath.   Cardiovascular:  Negative for chest pain and palpitations.  Gastrointestinal:  Negative for abdominal pain, constipation, diarrhea, nausea and vomiting.  Musculoskeletal:  Negative for joint pain and myalgias.  Neurological:  Negative  for dizziness and headaches.  Psychiatric/Behavioral:  Positive for suicidal ideas. The patient is nervous/anxious.    Vitals: Blood pressure 125/85, pulse 84, temperature 98.4 F (36.9 C), temperature source Oral, resp. rate 18, SpO2 100 %. There is no height or weight on file to calculate BMI.   Disposition: Patient will be transferred to [facility] to begin inpatient psychiatric treatment.  EMTALA form completed.  Jaclyn Shaggy, PA-C 02/23/2021, 12:14 AM

## 2021-02-23 NOTE — Group Note (Signed)
Recreation Therapy Group Note   Group Topic:Health and Wellness  Group Date: 02/23/2021 Start Time: 1005 End Time: 1035 Facilitators: Caroll Rancher, LRT/CTRS Location: [address] Dayroom   Goal Area(s) Addresses:  Patient will actively participate in selected exercises.   Patient will verbalize benefit of exercise during group session. Patient will acknowledge benefits of exercise.   Group Description: LRT and patients engaged in a series of exercises.  LRT and patients took turns leading group dance moves or an actual exercise to keep everyone moving.  Patients were to get at least 30 minutes of movement.  Patients were encouraged take breaks or get water if needed.  Affect/Mood: N/A   Participation Level: Did not attend    Clinical Observations/Individualized Feedback: Pt did not attend group session.    Plan: Continue to engage patient in RT group sessions 2-3x/week.   Caroll Rancher, LRT/CTRS 02/23/2021 12:40 PM

## 2021-02-23 NOTE — BH IP Treatment Plan (Signed)
Interdisciplinary Treatment and Diagnostic Plan Update  02/23/2021 Time of Session: 9:45am  DORRANCE SELLICK MRN: [id number]  Principal Diagnosis: Psychosis, unspecified psychosis type [facility])  Secondary Diagnoses: Principal Problem:   Psychosis, 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[place]) Active Problems:   Generalized anxiety disorder   Mild episode of recurrent major depressive disorder ([place])   Paranoia ([place])   Substance use disorder   Current Medications:  Current Facility-Administered Medications  Medication Dose Route Frequency Provider Last Rate Last Admin   acetaminophen (TYLENOL) tablet 650 mg  650 mg Oral Q6H PRN Prescilla Sours, PA-C       alum & mag hydroxide-simeth (MAALOX/MYLANTA) 200-200-20 MG/5ML suspension 30 mL  30 mL Oral Q4H PRN Margorie John W, PA-C       hydrOXYzine (ATARAX/VISTARIL) tablet 25 mg  25 mg Oral TID PRN Prescilla Sours, PA-C   25 mg at 02/23/21 1134   loperamide (IMODIUM) capsule 2-4 mg  2-4 mg Oral PRN Arthor Captain, MD       LORazepam (ATIVAN) tablet 1 mg  1 mg Oral Q6H PRN Arthor Captain, MD       OLANZapine zydis (ZYPREXA) disintegrating tablet 5 mg  5 mg Oral Q8H PRN Arthor Captain, MD       And   LORazepam (ATIVAN) tablet 1 mg  1 mg Oral PRN Arthor Captain, MD       And   ziprasidone (GEODON) injection 20 mg  20 mg Intramuscular PRN Arthor Captain, MD       magnesium hydroxide (MILK OF MAGNESIA) suspension 30 mL  30 mL Oral Daily PRN Margorie John W, PA-C       multivitamin with minerals tablet 1 tablet  1 tablet Oral Daily Arthor Captain, MD   1 tablet at 02/23/21 1134   nicotine polacrilex (NICORETTE) gum 2 mg  2 mg Oral PRN Margorie John W, PA-C       OLANZapine zydis (ZYPREXA) disintegrating tablet 10 mg  10 mg Oral QHS Arthor Captain, MD       ondansetron (ZOFRAN-ODT) disintegrating tablet 4 mg  4 mg Oral Q6H PRN Arthor Captain, MD       [START ON 02/24/2021] thiamine tablet 100 mg  100 mg Oral Daily Arthor Captain, MD       traZODone (DESYREL)  tablet 50 mg  50 mg Oral QHS PRN Prescilla Sours, PA-C       PTA Medications: Medications Prior to Admission  Medication Sig Dispense Refill Last Dose   busPIRone (BUSPAR) 10 MG tablet Take 1 tablet (10 mg total) by mouth 3 (three) times daily. (Patient taking differently: Take 10 mg by mouth at bedtime.) 90 tablet 2 Past Month    Patient Stressors: Marital or family conflict    Patient Strengths: Motivation for treatment/growth   Treatment Modalities: Medication Management, Group therapy, Case management,  1 to 1 session with clinician, Psychoeducation, Recreational therapy.   Physician Treatment Plan for Primary Diagnosis: Psychosis, 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[place]) Long Term Goal(s):     Short Term Goals:    Medication Management: Evaluate patient's response, side effects, and tolerance of medication regimen.  Therapeutic Interventions: 1 to 1 sessions, Unit Group sessions and Medication administration.  Evaluation of Outcomes: Not Met  Physician Treatment Plan for Secondary Diagnosis: Principal Problem:   Psychosis, 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[place]) Active Problems:   Generalized anxiety disorder   Mild episode of recurrent major depressive disorder ([place])   Paranoia ([place])  Substance use disorder  Long Term Goal(s):     Short Term Goals:       Medication Management: Evaluate patient's response, side effects, and tolerance of medication regimen.  Therapeutic Interventions: 1 to 1 sessions, Unit Group sessions and Medication administration.  Evaluation of Outcomes: Not Met   RN Treatment Plan for Primary Diagnosis: Psychosis, 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[place]) Long Term Goal(s): Knowledge of disease and therapeutic regimen to maintain health will improve  Short Term Goals: Ability to remain free from injury will improve, Ability to participate in decision making will improve, Ability to verbalize feelings will improve, Ability to disclose and discuss suicidal ideas, and  Ability to identify and develop effective coping behaviors will improve  Medication Management: RN will administer medications as ordered by provider, will assess and evaluate patient's response and provide education to patient for prescribed medication. RN will report any adverse and/or side effects to prescribing provider.  Therapeutic Interventions: 1 on 1 counseling sessions, Psychoeducation, Medication administration, Evaluate responses to treatment, Monitor vital signs and CBGs as ordered, Perform/monitor CIWA, COWS, AIMS and Fall Risk screenings as ordered, Perform wound care treatments as ordered.  Evaluation of Outcomes: Not Met   LCSW Treatment Plan for Primary Diagnosis: Psychosis, 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[place]) Long Term Goal(s): Safe transition to appropriate next level of care at discharge, Engage patient in therapeutic group addressing interpersonal concerns.  Short Term Goals: Engage patient in aftercare planning with referrals and resources, Increase social support, Increase emotional regulation, Facilitate acceptance of mental health diagnosis and concerns, Identify triggers associated with mental health/substance abuse issues, and Increase skills for wellness and recovery  Therapeutic Interventions: Assess for all discharge needs, 1 to 1 time with Social worker, Explore available resources and support systems, Assess for adequacy in community support network, Educate family and significant other(s) on suicide prevention, Complete Psychosocial Assessment, Interpersonal group therapy.  Evaluation of Outcomes: Not Met   Progress in Treatment: Attending groups: Yes. Participating in groups: Yes. Taking medication as prescribed: Yes. Toleration medication: Yes. Family/Significant other contact made: Yes, individual(s) contacted:  Father  Patient understands diagnosis: No. Discussing patient identified problems/goals with staff: Yes. Medical problems stabilized or resolved:  Yes. Denies suicidal/homicidal ideation: Yes. Issues/concerns per patient self-inventory: No.   New problem(s) identified: No, Describe:  None   New Short Term/Long Term Goal(s): medication stabilization, elimination of SI thoughts, development of comprehensive mental wellness plan.   Patient Goals:  Did not attend   Discharge Plan or Barriers: Patient recently admitted. CSW will continue to follow and assess for appropriate referrals and possible discharge planning.   Reason for Continuation of Hospitalization: Anxiety Depression Hallucinations Homicidal ideation Medication stabilization Suicidal ideation  Estimated Length of Stay: 3 to 5 days    Scribe for Treatment Team: Darleen Crocker, Latanya Presser 02/23/2021 2:51 PM

## 2021-02-23 NOTE — [facility] Group Notes (Signed)
[facility] Group Notes:  (Nursing/MHT/Case Management/Adjunct)  Date:  02/23/2021  Time:  1:00 PM  Type of Therapy:  Group Therapy  Participation Level:  Did Not Attend  Summary of Progress/Problems: pt didn't attend goals group  Jeffery Baldwin 02/23/2021, 1:00 PM

## 2021-02-23 NOTE — H&P (Signed)
Psychiatric Admission Assessment Adult  Patient Identification: Jeffery Baldwin MRN:  [id number] Date of Evaluation:  02/23/2021 Chief Complaint:  major dep disorder recurrent, moderate; unspecified psychosis. Principal Diagnosis: Psychosis, 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[place]) Diagnosis:  Principal Problem:   Psychosis, 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[place]) Active Problems:   Generalized anxiety disorder   Mild episode of recurrent major depressive disorder ([place])   Paranoia ([place])   Substance use disorder  History of Present Illness: Medical record reviewed.  Patient's case discussed in detail with nursing staff and members of the treatment team.  I met with and evaluated the patient on the unit today.  Jeffery Baldwin is a 30 year old male with history of prior treatment for ADHD, MDD, GAD, paranoia and history of substance abuse including remote history of heroin use disorder who presented to [facility] saying he needed to be committed due to feeling he is being "gaslighted, tortured and trust broken" and presenting with disorganized thought processes, paranoid delusions, trouble sleeping and suicidal ideation.  Patient made statements about malicious codes having been embedded on his electronic devices and reported that he had been replaying/reliving scenarios or events from his life.  Staff who evaluated him at [facility] documented that he appeared to be responding to internal stimuli although he denied AH or VH at that time.  Patient reportedly was given olanzapine 5 mg, trazodone 50 mg and Vistaril 25 mg last night and was transferred to [facility] early this morning for further evaluation and treatment.  On interview with me today, patient is guarded, anxious and disorganized with constricted affect.  He is a poor historian.  Patient says that he feels like he is being psychologically tortured.  He reports he has been living some of the same events over again for about the past 20 months and notes that he is seeing the  same clouds, the same weather, having the same conversations, etc.  Patient tells me that he feels like he is moving back in time, is living out other people's experiences after he has conversations with them and feels outcomes in his life are being controlled.  He reports paranoid concerns that he is being watched and that his phone and accounts have been hacked.  Patient states he believes that he is being targeted "by Mexicans" for financial gain and possibly other reasons.  He reports belief that he has been exposed to scopolamine in recent weeks "because it makes you open to suggestion" and also believes he was being given an antidepressant prior to admission without his knowledge.  Patient endorses auditory hallucinations (of people repeating what he says), thought broadcasting, anxiety, trouble focusing, getting sidetracked, insomnia, sad mood, decreased appetite with weight loss, nonspecific thoughts of harming others (no target or plan), and worsening nonspecific suicidal ideation due to feeling overwhelmed by his recent experiences (no intent or plan).  Patient denies thoughts of harming himself in the hospital.  Patient denies taking any psychiatric medications prior to admission.  Patient says that he drank some alcohol prior to admission but denies consistent or frequent alcohol use or history of withdrawal symptoms.  Patient reports using marijuana on the day prior to admission but denies regular use.  He reports use of delta 8 and CBD in the past but not in the last 1 or 2 months.  He has used cocaine in the past but denies use in recent months.  Patient says that he has a remote history of IV drug use of heroin but has not been using heroin  recently.  He does report taking tramadol and Klonopin that are not prescribed for him during the month prior to current hospitalization.  Patient has difficulty quantifying the amounts of tramadol and Klonopin taken.  He estimates that he was taking between 2  and 4 Klonopin pills most days and says they are the smallest available dose.  He states that he was taking up to 5 pills of tramadol per day.  Patient denies current diaphoresis, tremor, nausea, vomiting, increased heart rate, diarrhea, pain or other withdrawal symptoms.  Patient says he has paralysis of right arm resulting from torn nerve roots in a football accident in October 2010.  He denies any history of seizure, concussion, asthma, diabetes or other medical problems.   Associated Signs/Symptoms: Depression Symptoms:  depressed mood, insomnia, difficulty concentrating, suicidal thoughts without plan, anxiety, weight loss, decreased appetite, Duration of Depression Symptoms: Greater than two weeks  (Hypo) Manic Symptoms:  Delusions, Distractibility, Hallucinations, Anxiety Symptoms:  Excessive Worry, Psychotic Symptoms:  Delusions, Hallucinations: Auditory Paranoia, PTSD Symptoms: No history of trauma disclosed by patient Total Time spent with patient:  50 minutes  Past Psychiatric History: Patient says he is uncertain whether he was seeing a psychiatrist in recent months.  Chart review indicates that patient was seen by Burt Ek with most recent visit on 12/07/2020 for medication management for social anxiety, substance use and generalized anxiety.  He was prescribed BuSpar and Strattera but was not adherent to his outpatient medication regimen.  Outpatient clinic notes also document that patient was taking Klonopin and tramadol which he obtained from a friend and has a history of misusing Roxicodone.  At the July outpatient visit patient made paranoid statements about his phone being tapped and being gaslighted but declined to start an antipsychotic.  Patient denies prior history of inpatient psychiatric hospitalization.  He says he attempted suicide once about 5 years ago by hanging himself in the garage but changed his mind and did not follow through with it.  Patient says  he does not know whether he has been given a psychiatric diagnosis in the past.  Is the patient at risk to self? Yes.    Has the patient been a risk to self in the past 6 months? Yes.    Has the patient been a risk to self within the distant past? Yes.    Is the patient a risk to others? Yes.    Has the patient been a risk to others in the past 6 months? No.  Has the patient been a risk to others within the distant past? No.   Prior Inpatient Therapy:   Prior Outpatient Therapy:    Alcohol Screening: 1. How often do you have a drink containing alcohol?: Never 2. How many drinks containing alcohol do you have on a typical day when you are drinking?: 1 or 2 3. How often do you have six or more drinks on one occasion?: Never AUDIT-C Score: 0 9. Have you or someone else been injured as a result of your drinking?: No 10. Has a relative or friend or a doctor or another health worker been concerned about your drinking or suggested you cut down?: No Alcohol Use Disorder Identification Test Final Score (AUDIT): 0 Substance Abuse History in the last 12 months:  Yes.    See HPI. Consequences of Substance Abuse: Symptoms described in HPI precipitating current admission have likely at least been exacerbated by substance use if not caused by substance use Previous Psychotropic Medications: Yes  Psychological Evaluations: Yes  Past Medical History:  Past Medical History:  Diagnosis Date   Chronic pain syndrome    Heroin abuse ([place])    Nerve root avulsion    Spinal cord injury at C5-C7 level without injury of spinal bone ([place]) 2011    Past Surgical History:  Procedure Laterality Date   nerve root repair     Family History: History reviewed. No pertinent family history. Family Psychiatric  History: Patient says that there are members of his family with mental health problems related to trauma.  He says there are some family members with drug and alcohol problems.  Patient says there is no family  history of suicide. Tobacco Screening:   Social History:  Social History   Substance and Sexual Activity  Alcohol Use Yes   Alcohol/week: 0.0 standard drinks   Comment: occasional     Social History   Substance and Sexual Activity  Drug Use Yes   Comment: heroine    Additional Social History: Marital status: Single Are you sexually active?: No What is your sexual orientation?: Heterosexual Has your sexual activity been affected by drugs, alcohol, medication, or emotional stress?: denies Does patient have children?: Yes How many children?: 3 How is patient's relationship with their children?: Has no relationship, but wants one                         Allergies:   Allergies  Allergen Reactions   Morphine And Related Other (See Comments)    Emesis   Ms Contin [Morphine Sulfate] Nausea And Vomiting   Lab Results:  Results for orders placed or performed during the hospital encounter of 02/22/21 (from the past 48 hour(s))  POCT Urine Drug Screen - ([facility])     Status: Abnormal   Collection Time: 02/22/21  7:40 PM  Result Value Ref Range   POC Amphetamine UR None Detected NONE DETECTED (Cut Off Level 1000 ng/mL)   POC Secobarbital (BAR) None Detected NONE DETECTED (Cut Off Level 300 ng/mL)   POC Buprenorphine (BUP) None Detected NONE DETECTED (Cut Off Level 10 ng/mL)   POC Oxazepam (BZO) None Detected NONE DETECTED (Cut Off Level 300 ng/mL)   POC Cocaine UR None Detected NONE DETECTED (Cut Off Level 300 ng/mL)   POC Methamphetamine UR None Detected NONE DETECTED (Cut Off Level 1000 ng/mL)   POC Morphine None Detected NONE DETECTED (Cut Off Level 300 ng/mL)   POC Oxycodone UR None Detected NONE DETECTED (Cut Off Level 100 ng/mL)   POC Methadone UR None Detected NONE DETECTED (Cut Off Level 300 ng/mL)   POC Marijuana UR Positive (A) NONE DETECTED (Cut Off Level 50 ng/mL)  Resp Panel by RT-PCR (Flu A&B, Covid) Nasopharyngeal Swab     Status: None   Collection Time:  02/22/21  7:55 PM   Specimen: Nasopharyngeal Swab; Nasopharyngeal(NP) swabs in vial transport medium  Result Value Ref Range   SARS Coronavirus 2 by RT PCR NEGATIVE NEGATIVE    Comment: (NOTE) SARS-CoV-2 target nucleic acids are NOT DETECTED.  The SARS-CoV-2 RNA is generally detectable in upper respiratory specimens during the acute phase of infection. The lowest concentration of SARS-CoV-2 viral copies this assay can detect is 138 copies/mL. A negative result does not preclude SARS-Cov-2 infection and should not be used as the sole basis for treatment or other patient management decisions. A negative result may occur with  improper specimen collection/handling, submission of specimen other than nasopharyngeal swab, presence of viral  mutation(s) within the areas targeted by this assay, and inadequate number of viral copies(<138 copies/mL). A negative result must be combined with clinical observations, patient history, and epidemiological information. The expected result is Negative.  Fact Sheet for Patients:  EntrepreneurPulse.com.au  Fact Sheet for Healthcare Providers:  IncredibleEmployment.be  This test is no t yet approved or cleared by the [place] FDA and  has been authorized for detection and/or diagnosis of SARS-CoV-2 by FDA under an Emergency Use Authorization (EUA). This EUA will remain  in effect (meaning this test can be used) for the duration of the COVID-19 declaration under Section 564(b)(1) of the Act, 21 U.S.C.section 360bbb-3(b)(1), unless the authorization is terminated  or revoked sooner.       Influenza A by PCR NEGATIVE NEGATIVE   Influenza B by PCR NEGATIVE NEGATIVE    Comment: (NOTE) The Xpert Xpress SARS-CoV-2/FLU/RSV plus assay is intended as an aid in the diagnosis of influenza from Nasopharyngeal swab specimens and should not be used as a sole basis for treatment. Nasal washings and aspirates are  unacceptable for Xpert Xpress SARS-CoV-2/FLU/RSV testing.  Fact Sheet for Patients: EntrepreneurPulse.com.au  Fact Sheet for Healthcare Providers: IncredibleEmployment.be  This test is not yet approved or cleared by the [place] FDA and has been authorized for detection and/or diagnosis of SARS-CoV-2 by FDA under an Emergency Use Authorization (EUA). This EUA will remain in effect (meaning this test can be used) for the duration of the COVID-19 declaration under Section 564(b)(1) of the Act, 21 U.S.C. section 360bbb-3(b)(1), unless the authorization is terminated or revoked.  Performed at [facility], [place] [address], [place], [place] [address]   POC SARS Coronavirus 2 Ag-ED - Nasal Swab     Status: Normal (Preliminary result)   Collection Time: 02/22/21  7:55 PM  Result Value Ref Range   SARS Coronavirus 2 Ag Negative Negative  Urinalysis, Complete w Microscopic Urine, Clean Catch     Status: Abnormal   Collection Time: 02/22/21  7:56 PM  Result Value Ref Range   Color, Urine YELLOW YELLOW   APPearance CLEAR CLEAR   Specific Gravity, Urine <1.005 (L) 1.005 - 1.030   pH 7.0 5.0 - 8.0   Glucose, UA NEGATIVE NEGATIVE mg/dL   Hgb urine dipstick NEGATIVE NEGATIVE   Bilirubin Urine NEGATIVE NEGATIVE   Ketones, ur NEGATIVE NEGATIVE mg/dL   Protein, ur NEGATIVE NEGATIVE mg/dL   Nitrite NEGATIVE NEGATIVE   Leukocytes,Ua NEGATIVE NEGATIVE   Squamous Epithelial / LPF NONE SEEN 0 - 5   WBC, UA 0-5 0 - 5 WBC/hpf   RBC / HPF 0-5 0 - 5 RBC/hpf   Bacteria, UA NONE SEEN NONE SEEN    Comment: Performed at [facility], [address], [place], [place] [address]  CBC with Differential/Platelet     Status: Abnormal   Collection Time: 02/22/21  8:00 PM  Result Value Ref Range   WBC 8.0 4.0 - 10.5 K/uL   RBC 5.41 4.22 - 5.81 MIL/uL   Hemoglobin 17.5 (H) 13.0 - 17.0 g/dL   HCT 49.6 39.0 - 52.0 %   MCV 91.7 80.0 - 100.0 fL   MCH 32.3  26.0 - 34.0 pg   MCHC 35.3 30.0 - 36.0 g/dL   RDW 11.9 11.5 - 15.5 %   Platelets 194 150 - 400 K/uL   nRBC 0.0 0.0 - 0.2 %   Neutrophils Relative % 63 %   Neutro Abs 5.0 1.7 - 7.7 K/uL  Lymphocytes Relative 31 %   Lymphs Abs 2.5 0.7 - 4.0 K/uL   Monocytes Relative 5 %   Monocytes Absolute 0.4 0.1 - 1.0 K/uL   Eosinophils Relative 1 %   Eosinophils Absolute 0.1 0.0 - 0.5 K/uL   Basophils Relative 0 %   Basophils Absolute 0.0 0.0 - 0.1 K/uL   Immature Granulocytes 0 %   Abs Immature Granulocytes 0.02 0.00 - 0.07 K/uL    Comment: Performed at [facility] [address] [place], [place] [address]  Comprehensive metabolic panel     Status: Abnormal   Collection Time: 02/22/21  8:00 PM  Result Value Ref Range   Sodium 139 135 - 145 mmol/L   Potassium 4.0 3.5 - 5.1 mmol/L   Chloride 101 98 - 111 mmol/L   CO2 28 22 - 32 mmol/L   Glucose, Bld 89 70 - 99 mg/dL    Comment: Glucose reference range applies only to samples taken after fasting for at least 8 hours.   BUN <5 (L) 6 - 20 mg/dL   Creatinine, Ser 0.82 0.61 - 1.24 mg/dL   Calcium 10.1 8.9 - 10.3 mg/dL   Total Protein 7.6 6.5 - 8.1 g/dL   Albumin 5.3 (H) 3.5 - 5.0 g/dL   AST 40 15 - 41 U/L   ALT 58 (H) 0 - 44 U/L   Alkaline Phosphatase 57 38 - 126 U/L   Total Bilirubin 1.1 0.3 - 1.2 mg/dL   GFR, Estimated >60 >60 mL/min    Comment: (NOTE) Calculated using the CKD-EPI Creatinine Equation (2021)    Anion gap 10 5 - 15    Comment: Performed at [facility] [address] [place], [place] [address]  Hemoglobin A1c     Status: None   Collection Time: 02/22/21  8:00 PM  Result Value Ref Range   Hgb A1c MFr Bld 4.9 4.8 - 5.6 %    Comment: (NOTE) Pre diabetes:          5.7%-6.4%  Diabetes:              >6.4%  Glycemic control for   <7.0% adults with diabetes    Mean Plasma Glucose 93.93 mg/dL    Comment: Performed at [facility] [address], [place], [place] [address]  Magnesium     Status: None    Collection Time: 02/22/21  8:00 PM  Result Value Ref Range   Magnesium 2.1 1.7 - 2.4 mg/dL    Comment: Performed at [facility], [place] [address] [place], [place] [address]  Ethanol     Status: None   Collection Time: 02/22/21  8:00 PM  Result Value Ref Range   Alcohol, Ethyl (B) <10 <10 mg/dL    Comment: (NOTE) Lowest detectable limit for serum alcohol is 10 mg/dL.  For medical purposes only. Performed at [facility], [place] [address] [place], [place] [address]   RPR     Status: None   Collection Time: 02/22/21  8:00 PM  Result Value Ref Range   RPR Ser Ql NON REACTIVE NON REACTIVE    Comment: Performed at [facility], [place] [address] [place], [place] [address]  Hepatitis panel, acute     Status: Abnormal   Collection Time: 02/22/21  8:00 PM  Result Value Ref Range   Hepatitis B Surface Ag NON REACTIVE NON REACTIVE   HCV Ab Reactive (A) NON REACTIVE    Comment: (  NOTE) The CDC recommends that a Reactive HCV antibody result be followed up  with a HCV Nucleic Acid Amplification test.     Hep A IgM NON REACTIVE NON REACTIVE   Hep B C IgM NON REACTIVE NON REACTIVE    Comment: Performed at [facility], [place] [address], [place], [place] [address]  HIV Antibody (routine testing w rflx)     Status: None   Collection Time: 02/22/21  8:00 PM  Result Value Ref Range   HIV Screen 4th Generation wRfx Non Reactive Non Reactive    Comment: Performed at [facility], [place] [address], [place], [place] [address]  Lipid panel     Status: None   Collection Time: 02/22/21  8:00 PM  Result Value Ref Range   Cholesterol 166 0 - 200 mg/dL   Triglycerides 42 <150 mg/dL   HDL 93 >40 mg/dL   Total CHOL/HDL Ratio 1.8 RATIO   VLDL 8 0 - 40 mg/dL   LDL Cholesterol 65 0 - 99 mg/dL    Comment:        Total Cholesterol/HDL:CHD Risk Coronary Heart Disease Risk Table                     Men   Women  1/2 Average Risk   3.4   3.3  Average Risk       5.0   4.4  2 X  Average Risk   9.6   7.1  3 X Average Risk  23.4   11.0        Use the calculated Patient Ratio above and the CHD Risk Table to determine the patient's CHD Risk.        ATP III CLASSIFICATION (LDL):  <100     mg/dL   Optimal  100-129  mg/dL   Near or Above                    Optimal  130-159  mg/dL   Borderline  160-189  mg/dL   High  >190     mg/dL   Very High Performed at [facility] [address], [place], [place] [address]   TSH     Status: None   Collection Time: 02/22/21  8:00 PM  Result Value Ref Range   TSH 0.503 0.350 - 4.500 uIU/mL    Comment: Performed by a 3rd Generation assay with a functional sensitivity of <=0.01 uIU/mL. Performed at [facility], [place] [address], [place], [place] [address]   POC SARS Coronavirus 2 Ag     Status: None   Collection Time: 02/22/21  8:08 PM  Result Value Ref Range   SARSCOV2ONAVIRUS 2 AG NEGATIVE NEGATIVE    Comment: (NOTE) SARS-CoV-2 antigen NOT DETECTED.   Negative results are presumptive.  Negative results do not preclude SARS-CoV-2 infection and should not be used as the sole basis for treatment or other patient management decisions, including infection  control decisions, particularly in the presence of clinical signs and  symptoms consistent with COVID-19, or in those who have been in contact with the virus.  Negative results must be combined with clinical observations, patient history, and epidemiological information. The expected result is Negative.  Fact Sheet for Patients: HandmadeRecipes.com.cy  Fact Sheet for Healthcare Providers: FuneralLife.at  This test is not yet approved or cleared by the [place] FDA and  has been authorized for detection and/or diagnosis of SARS-CoV-2 by FDA under  an Emergency Use Authorization (EUA).  This EUA will remain in effect (meaning this test can be used) for the duration of  the COV ID-19 declaration under Section  564(b)(1) of the Act, 21 U.S.C. section 360bbb-3(b)(1), unless the authorization is terminated or revoked sooner.      Blood Alcohol level:  Lab Results  Component Value Date   ETH <10 02/22/2021   ETH <10 95/63/8756    Metabolic Disorder Labs:  Lab Results  Component Value Date   HGBA1C 4.9 02/22/2021   MPG 93.93 02/22/2021   No results found for: PROLACTIN Lab Results  Component Value Date   CHOL 166 02/22/2021   TRIG 42 02/22/2021   HDL 93 02/22/2021   CHOLHDL 1.8 02/22/2021   VLDL 8 02/22/2021   LDLCALC 65 02/22/2021    Current Medications: Current Facility-Administered Medications  Medication Dose Route Frequency Provider Last Rate Last Admin   acetaminophen (TYLENOL) tablet 650 mg  650 mg Oral Q6H PRN Prescilla Sours, PA-C       alum & mag hydroxide-simeth (MAALOX/MYLANTA) 200-200-20 MG/5ML suspension 30 mL  30 mL Oral Q4H PRN Margorie John W, PA-C       hydrOXYzine (ATARAX/VISTARIL) tablet 25 mg  25 mg Oral TID PRN Prescilla Sours, PA-C   25 mg at 02/23/21 1134   loperamide (IMODIUM) capsule 2-4 mg  2-4 mg Oral PRN Arthor Captain, MD       LORazepam (ATIVAN) tablet 1 mg  1 mg Oral Q6H PRN Arthor Captain, MD       OLANZapine zydis (ZYPREXA) disintegrating tablet 5 mg  5 mg Oral Q8H PRN Arthor Captain, MD       And   LORazepam (ATIVAN) tablet 1 mg  1 mg Oral PRN Arthor Captain, MD       And   ziprasidone (GEODON) injection 20 mg  20 mg Intramuscular PRN Arthor Captain, MD       magnesium hydroxide (MILK OF MAGNESIA) suspension 30 mL  30 mL Oral Daily PRN Margorie John W, PA-C       multivitamin with minerals tablet 1 tablet  1 tablet Oral Daily Arthor Captain, MD   1 tablet at 02/23/21 1134   nicotine polacrilex (NICORETTE) gum 2 mg  2 mg Oral PRN Margorie John W, PA-C       OLANZapine zydis (ZYPREXA) disintegrating tablet 10 mg  10 mg Oral QHS Arthor Captain, MD       ondansetron (ZOFRAN-ODT) disintegrating tablet 4 mg  4 mg Oral Q6H PRN Arthor Captain, MD        [START ON 02/24/2021] thiamine tablet 100 mg  100 mg Oral Daily Arthor Captain, MD       traZODone (DESYREL) tablet 50 mg  50 mg Oral QHS PRN Prescilla Sours, PA-C       PTA Medications: Medications Prior to Admission  Medication Sig Dispense Refill Last Dose   busPIRone (BUSPAR) 10 MG tablet Take 1 tablet (10 mg total) by mouth 3 (three) times daily. (Patient taking differently: Take 10 mg by mouth at bedtime.) 90 tablet 2 Past Month    Musculoskeletal: Strength & Muscle Tone:  Decreased muscle tone of right upper extremity and right upper extremity paralysis Gait & Station:  Not tested; patient in bed Patient leans: N/A   Psychiatric Specialty Exam:  Presentation  General Appearance: Disheveled  Eye Contact:Fair  Speech:Clear and Coherent; Normal Rate  Speech Volume:Normal  Handedness:Right  Mood and Affect  Mood:Anxious; Dysphoric  Affect:Congruent   Thought Process  Thought Processes:Disorganized  Duration of Psychotic Symptoms: Greater than six months  Past Diagnosis of Schizophrenia or Psychoactive disorder: No  Descriptions of Associations:Tangential  Orientation:Full (Time, Place and Person)  Thought Content:Illogical; Paranoid Ideation; Tangential; Delusions  Hallucinations:Hallucinations: Auditory Description of Auditory Hallucinations: Voices of people repeating what I say  Ideas of Reference:Paranoia  Suicidal Thoughts:Suicidal Thoughts: Yes, Passive SI Passive Intent and/or Plan: Without Intent; Without Plan  Homicidal Thoughts:Homicidal Thoughts: Yes, Passive HI Passive Intent and/or Plan: Without Intent; Without Plan   Sensorium  Memory:Immediate Poor; Recent Poor; Remote Poor  Judgment:Impaired  Insight:Shallow   Executive Functions  Concentration:Fair  Attention Span:Fair  [place]  Language:Good   Psychomotor Activity  Psychomotor Activity:Psychomotor Activity: Normal   Assets   Assets:Desire for Improvement; Housing   Sleep  Sleep:Sleep: Poor Number of Hours of Sleep: 2    Physical Exam: Physical ExamVitals and nursing note reviewed.  Constitutional:      General: He is not in acute distress.    Appearance: Normal appearance. He is not diaphoretic.  HENT:     Head: Normocephalic and atraumatic.  Cardiovascular:     Rate and Rhythm: Normal rate.  Pulmonary:     Effort: Pulmonary effort is normal.  Neurological:     General: No focal deficit present.     Mental Status: He is alert and oriented to person, place, and time.   ROS Constitutional:  Negative for chills, diaphoresis and fever.  HENT:  Negative for sore throat.   Respiratory:  Negative for cough and shortness of breath.   Cardiovascular:  Negative for chest pain and palpitations.  Gastrointestinal:  Negative for constipation, diarrhea, nausea and vomiting.  Genitourinary:  Negative for dysuria.  Musculoskeletal:  Negative for myalgias.  Skin:  Negative for rash.  Neurological:  Positive for focal weakness. Negative for dizziness, seizures and headaches.       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 The patient is nervous/anxious and has insomnia.   All other systems reviewed and are negative.  Blood pressure 135/88, pulse 82, temperature 97.9 F (36.6 C), temperature source Oral, resp. rate 16, height 5' 11"  (1.803 m), weight 64.6 kg, SpO2 97 %. Body mass index is 19.87 kg/m.  Treatment Plan Summary: The patient is a 30 year old male with prior diagnoses of ADHD, anxiety, MDD, substance abuse and history of paranoia who was admitted with worsening psychotic symptoms and suicidal ideation in the context of substance use (THC, tramadol, Klonopin).  Differential diagnosis includes substance-induced psychotic disorder versus primary psychotic disorder  versus mood disorder with psychotic features.  Patient has been admitted to the 500 inpatient unit for crisis stabilization and treatment.  Daily contact with patient to assess and evaluate symptoms and progress in treatment and Medication management  Observation Level/Precautions:  Detox 15 minute checks  Laboratory:  CBC Chemistry Profile HbAIC UDS Lipid panel, TSH Available lab results reviewed.  CMP showed BUN less than 5, albumin 5.3, ALT 58 and otherwise WNL.  Lipid profile was WNL.  CBC and differential showed hemoglobin 17.5 and otherwise WNL.  Hemoglobin A1c was 4.9.  TSH was 0.503.  RPR was nonreactive.  Influenza A, influenza B and coronavirus testing were negative.  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  HIV screen  was nonreactive.  BAL was <10 and urine tox cream was positive for marijuana.    EKG performed evening of 02/22/2021 showed sinus bradycardia, ventricular rate 58 and QT/QTc 402/394.    Psychotherapy: Encourage participation in group therapy and therapeutic milieu  Medications:  Zyprexa has been initiated to treat psychotic symptoms.  Patient received Zyprexa 5 mg this morning and will receive Zyprexa 10 mg at bedtime tonight.  Anticipate further upward titration.  Patient is not currently reporting or displaying signs or symptoms of withdrawal from benzodiazepines, alcohol or opiates.  He has been started on CIWA protocol with comfort meds and lorazepam for CIWA scores>10 to cover for any possible benzodiazepine withdrawal.  Will defer initiation of COWS protocol for now.  We will continue to monitor for any signs or symptoms of substance withdrawal.  Agitation protocol has been ordered.  See MAR for additional details.    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  Patient would benefit from participation in substance abuse treatment program  after discharge if he is organized enough to attend and receptive to attending at that time.  Estimated LOS: 5 to 7 days  Other:     Physician Treatment Plan for Primary Diagnosis: Psychosis, 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[place]) Long Term Goal(s): Improvement in symptoms so as ready for discharge  Short Term Goals: Ability to identify changes in lifestyle to reduce recurrence of condition will improve, Ability to verbalize feelings will improve, Ability to disclose and discuss suicidal ideas, Ability to demonstrate self-control will improve, Ability to identify and develop effective coping behaviors will improve, Ability to maintain clinical measurements within normal limits will improve, Compliance with prescribed medications will improve, and Ability to identify triggers associated with substance abuse/mental health issues will improve  Physician Treatment Plan for Secondary Diagnosis: Principal Problem:   Psychosis, 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[place]) Active Problems:   Generalized anxiety disorder   Mild episode of recurrent major depressive disorder ([place])   Paranoia ([place])   Substance use disorder  Long Term Goal(s): Improvement in symptoms so as ready for discharge  Short Term Goals: Ability to identify changes in lifestyle to reduce recurrence of condition will improve, Ability to verbalize feelings will improve, Ability to disclose and discuss suicidal ideas, Ability to demonstrate self-control will improve, Ability to identify and develop effective coping behaviors will improve, Ability to maintain clinical measurements within normal limits will improve, Compliance with prescribed medications will improve, and Ability to identify triggers associated with substance abuse/mental health issues will improve  I certify that inpatient services furnished can reasonably be expected to improve the patient's condition.    Arthor Captain, MD 9/21/20222:36 PM

## 2021-02-23 NOTE — Progress Notes (Signed)
Recreation Therapy Notes  INPATIENT RECREATION THERAPY ASSESSMENT  Patient Details Name: Jeffery Baldwin MRN: [id number] DOB: Mar 02, 1991 Today's Date: 02/23/2021       Information Obtained From: Patient  Able to Participate in Assessment/Interview: Yes  Patient Presentation: Confused (Suspicious)  Reason for Admission (Per Patient): Other (Comments) ("lots of stuff")  Patient Stressors: Other (Comment) ("reliving the same day over and over")  Coping Skills:   Sports, Arguments, Aggression, Music, Exercise, Meditate, Deep Breathing  Leisure Interests (2+):  Individual - Other (Comment), Music - Listen (Learn technology/psychology; Exercise; Work)  Frequency of Recreation/Participation: Other (Comment) ("Not much")  Awareness of Community Resources:  No  Expressed Interest in [employer] Information: No  County of Residence:  [place]  Patient Main Form of Transportation: Car  Patient Strengths:  "put up with bullslhit"  Patient Identified Areas of Improvement:  Focusing; Working; Exercise; Learning  Patient Goal for Hospitalization:  "to understand what's actully taking place"  Current SI (including self-harm):  No  Current HI:  No  Current AVH: No ("not that I'm aware of")  Staff Intervention Plan: Group Attendance, Collaborate with Interdisciplinary Treatment Team  Consent to Intern Participation: N/A   Caroll Rancher, LRT/CTRS  Caroll Rancher A 02/23/2021, 1:13 PM

## 2021-02-23 NOTE — [facility] Group Notes (Signed)
Patient did not attend relaxation/ music therapy class

## 2021-02-23 NOTE — Progress Notes (Signed)
Adult Psychoeducational Group Note  Date:  02/23/2021 Time:  9:06 PM  Group Topic/Focus:  Wrap-Up Group:   The focus of this group is to help patients review their daily goal of treatment and discuss progress on daily workbooks.  Participation Level:  Active  Participation Quality:  Appropriate and Attentive  Affect:  Appropriate  Cognitive:  Alert and Appropriate  Insight: Appropriate and Good  Engagement in Group:  Engaged  Modes of Intervention:  Discussion  Additional Comments: Pt articulated that his goal for today was to focus on his treatment plan (to feel better), and this goal was accomplished. Pt stated he did talk with staff about his care. Pt conveyed he made a phone call to his family and reported relationship with his family and support system was improving. Pt reported he took all medications provided and attended all meal services with 100% intake. Pt said his appetite was good today. Pt evaluated his sleep last night as good. Pt verbalized he felt good about himself and rated his overall day a 5 out of 10 on this date. Pt articulated he had no physical pain. Pt denies no auditory or visual hallucinations or thoughts of harming himself or others. Pt said he would alert staff if anything changed. End of Wrap-Up Group progress report.     Nicoletta Dress 02/23/2021, 9:06 PM

## 2021-02-23 NOTE — Discharge Instructions (Signed)
Patient to be transferred to [facility] for inpatient psychiatric treatment.  

## 2021-02-23 NOTE — Progress Notes (Signed)
Admission Note:  Patient is 30 year old male who presents voluntarily to [facility] from [facility]. Patient reports that he is feeling like he is living the same day over and over. Patient reports his main stressors as family conflict and feeling like he cannot trust those around him. Patient presents with flat and sad affect at time of assessment. Patient denies HI but is positive for passive SI at this time. Patient contracts for safety. Patient is positive for AH but did not elaborate on the content on these hallucinations. Patient denies VH. Patient remains safe on the unit at this time.

## 2021-02-23 NOTE — [facility] Group Notes (Signed)
Adult Psychoeducational Group Note  Date:  02/23/2021 Time:  10:53 AM  Group Topic/Focus:  Goals Group:   The focus of this group is to help patients establish daily goals to achieve during treatment and discuss how the patient can incorporate goal setting into their daily lives to aide in recovery.  Participation Level:  Did Not Attend  Jeffery Baldwin 02/23/2021, 10:53 AM
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
## 2021-02-23 NOTE — Tx Team (Signed)
Initial Treatment Plan 02/23/2021 2:35 AM [name] LOV:[id number]    PATIENT STRESSORS: Marital or family conflict     PATIENT STRENGTHS: Motivation for treatment/growth    PATIENT IDENTIFIED PROBLEMS: Suicidal Ideation   (Patient did not identify any goals at this time)                   DISCHARGE CRITERIA:  Improved stabilization in mood, thinking, and/or behavior Verbal commitment to aftercare and medication compliance  PRELIMINARY DISCHARGE PLAN: Outpatient therapy Return to previous living arrangement  PATIENT/FAMILY INVOLVEMENT: This treatment plan has been presented to and reviewed with the patient, Jeffery Baldwin, and/or family member.  The patient and family have been given the opportunity to ask questions and make suggestions.  Mancel Bale, RN 02/23/2021, 2:35 AM

## 2021-02-23 NOTE — Progress Notes (Signed)
Pt visible on the unit this evening. Pt stated he was feeling better, but pt appeared to possibly minimizing his Sx, pt was appropriate on the unit this evening.     02/23/21 2100  Psych Admission Type (Psych Patients Only)  Admission Status Voluntary  Psychosocial Assessment  Eye Contact Fair  Facial Expression Flat  Affect Appropriate to circumstance  Speech Logical/coherent  Interaction Assertive  Motor Activity Other (Comment) (WDL)  Appearance/Hygiene Unremarkable  Behavior Characteristics Cooperative;Anxious  Mood Depressed;Anxious  Thought Process  Coherency WDL  Content WDL  Delusions None reported or observed  Perception WDL  Hallucination None reported or observed  Judgment Impaired  Confusion None  Danger to Self  Current suicidal ideation? Denies  Self-Injurious Behavior No self-injurious ideation or behavior indicators observed or expressed   Agreement Not to Harm Self Yes  Description of Agreement Verbal contract  Danger to Others  Danger to Others None reported or observed

## 2021-02-23 NOTE — Group Note (Signed)
[facility] LCSW Group Therapy Note   Group Date: 02/23/2021 Start Time: 1300 End Time: 1400   Type of Therapy/Topic:  Group Therapy:  Emotion Regulation  Participation Level:  Did Not Attend     Description of Group:    The purpose of this group is to assist patients in learning to regulate negative emotions and experience positive emotions. Patients will be guided to discuss ways in which they have been vulnerable to their negative emotions. These vulnerabilities will be juxtaposed with experiences of positive emotions or situations, and patients challenged to use positive emotions to combat negative ones. Special emphasis will be placed on coping with negative emotions in conflict situations, and patients will process healthy conflict resolution skills.  Therapeutic Goals: Patient will identify two positive emotions or experiences to reflect on in order to balance out negative emotions:  Patient will label two or more emotions that they find the most difficult to experience:  Patient will be able to demonstrate positive conflict resolution skills through discussion or role plays:   Summary of Patient Progress: Pt did not attend.    Therapeutic Modalities:   Cognitive Behavioral Therapy Feelings Identification Dialectical Behavioral Therapy   Otelia Santee, LCSW

## 2021-02-23 NOTE — ED Notes (Signed)
Safe Transport requested. 

## 2021-02-23 NOTE — Progress Notes (Signed)
   02/23/21 0945  Psych Admission Type (Psych Patients Only)  Admission Status Voluntary  Psychosocial Assessment  Patient Complaints Sadness;Malaise;Anxiety  Eye Contact Fair  Facial Expression Flat  Affect Appropriate to circumstance  Speech Logical/coherent  Interaction Assertive  Motor Activity Other (Comment) (WDL)  Appearance/Hygiene Unremarkable  Behavior Characteristics Cooperative;Appropriate to situation;Anxious  Mood Depressed  Thought Process  Coherency WDL  Content WDL  Delusions None reported or observed  Perception WDL  Hallucination None reported or observed  Judgment Impaired  Confusion None  Danger to Self  Current suicidal ideation? Denies  Self-Injurious Behavior No self-injurious ideation or behavior indicators observed or expressed   Agreement Not to Harm Self Yes  Description of Agreement Verbal contract  Danger to Others  Danger to Others None reported or observed  Pt presents with disorganization and paranoia.  Pt is calm upon approach.  RN administered medications per provider orders.  Pt took medications without incident.  Pt remains safe on the unit with q 15 min checks in place.

## 2021-02-23 NOTE — [facility] Group Notes (Signed)
Patient attended and contributed to group 

## 2021-02-24 DIAGNOSIS — F29 Unspecified psychosis not due to a substance or known physiological condition: Secondary | ICD-10-CM | POA: Diagnosis not present

## 2021-02-24 LAB — SEDIMENTATION RATE: Sed Rate: 1 mm/hr (ref 0–16)

## 2021-02-24 MED ORDER — BENZTROPINE MESYLATE 0.5 MG PO TABS: 0.5000 mg | ORAL_TABLET | Freq: Four times a day (QID) | ORAL | Status: DC | PRN

## 2021-02-24 MED ORDER — ZIPRASIDONE MESYLATE 20 MG IM SOLR: 20.0000 mg | INTRAMUSCULAR | Status: DC | PRN

## 2021-02-24 MED ORDER — RISPERIDONE 2 MG PO TBDP
2.0000 mg | ORAL_TABLET | Freq: Three times a day (TID) | ORAL | Status: DC | PRN
Start: 2021-02-24 — End: 2021-02-24

## 2021-02-24 MED ORDER — LORAZEPAM 1 MG PO TABS
1.0000 mg | ORAL_TABLET | ORAL | Status: DC | PRN
  Filled 2021-02-24: qty 1

## 2021-02-24 MED ORDER — GABAPENTIN 100 MG PO CAPS
100.0000 mg | ORAL_CAPSULE | Freq: Three times a day (TID) | ORAL | Status: DC
  Administered 2021-02-24: 100 mg via ORAL
  Filled 2021-02-24 (×4): qty 1

## 2021-02-24 MED ORDER — RISPERIDONE 2 MG PO TBDP
2.0000 mg | ORAL_TABLET | Freq: Every day | ORAL | Status: DC
  Administered 2021-02-24 – 2021-02-25 (×2): 2 mg via ORAL
  Filled 2021-02-24 (×4): qty 1

## 2021-02-24 MED ORDER — LORAZEPAM 1 MG PO TABS: 1.0000 mg | ORAL_TABLET | ORAL | Status: DC | PRN

## 2021-02-24 MED ORDER — RISPERIDONE 1 MG PO TBDP
1.0000 mg | ORAL_TABLET | Freq: Three times a day (TID) | ORAL | Status: DC | PRN
  Administered 2021-03-09: 1 mg via ORAL
  Filled 2021-02-24: qty 1

## 2021-02-24 MED ORDER — RISPERIDONE 1 MG PO TBDP
1.0000 mg | ORAL_TABLET | Freq: Every day | ORAL | Status: DC
  Administered 2021-02-25: 1 mg via ORAL
  Filled 2021-02-24 (×3): qty 1

## 2021-02-24 MED ORDER — RISPERIDONE 2 MG PO TBDP
2.0000 mg | ORAL_TABLET | Freq: Every day | ORAL | Status: DC
  Filled 2021-02-24 (×2): qty 1

## 2021-02-24 MED ORDER — ENSURE ENLIVE PO LIQD
237.0000 mL | ORAL | Status: DC
  Administered 2021-02-24 – 2021-03-07 (×11): 237 mL via ORAL
  Filled 2021-02-24 (×16): qty 237

## 2021-02-24 MED ORDER — DIPHENHYDRAMINE HCL 50 MG/ML IJ SOLN: 50.0000 mg | Freq: Four times a day (QID) | INTRAMUSCULAR | Status: DC | PRN

## 2021-02-24 MED ORDER — BOOST / RESOURCE BREEZE PO LIQD CUSTOM
1.0000 | ORAL | Status: DC
  Administered 2021-02-24 – 2021-03-09 (×13): 1 via ORAL
  Filled 2021-02-24 (×17): qty 1

## 2021-02-24 MED ORDER — RISPERIDONE 1 MG PO TBDP
1.0000 mg | ORAL_TABLET | Freq: Once | ORAL | Status: AC
  Administered 2021-02-24: 1 mg via ORAL
  Filled 2021-02-24 (×2): qty 1

## 2021-02-24 NOTE — Progress Notes (Signed)
Patient attended the evening group and was appropriate.  

## 2021-02-24 NOTE — Progress Notes (Signed)
NUTRITION ASSESSMENT RD working remotely.  Pt identified as at risk on the Malnutrition Screen Tool  INTERVENTION: - will order Boost Breeze once/day, each supplement provides 250 kcal and 9 grams of protein. - will order Ensure Plus once/day, each supplement provides 350 kcal and 20 grams of protein. - will order 1 tablet multivitamin with minerals/day. [facility] staff to continue to encourage PO intakes of meals, snacks, and supplements.    NUTRITION DIAGNOSIS: Unintentional weight loss related to sub-optimal intake as evidenced by pt report.   Goal: Pt to meet >/= 90% of their estimated nutrition needs.  Monitor:  PO intake  Assessment:  Patient admitted for recurrent MDD with psychosis.   Yesterday he weighed 142 lb and weight on 12/07/20 was 150 lb. This indicates 8 lb weight loss (5.3% body weight) in 2.5 months; not significant for time frame.   30 y.o. male  Height: Ht Readings from Last 1 Encounters:  02/23/21 5\' 11"  (1.803 m)    Weight: Wt Readings from Last 1 Encounters:  02/23/21 64.6 kg    Weight Hx: Wt Readings from Last 10 Encounters:  02/23/21 64.6 kg  01/23/21 68.9 kg  12/07/20 68 kg  12/20/19 74.8 kg  08/29/15 120.2 kg  01/27/15 86.2 kg  11/14/14 91.9 kg  08/10/14 89.4 kg  07/09/14 90.3 kg  07/02/14 92.4 kg    BMI:  Body mass index is 19.87 kg/m. Pt meets criteria for normal weight based on current BMI.  Estimated Nutritional Needs: Kcal: 25-30 kcal/kg Protein: > 1 gram protein/kg Fluid: 1 ml/kcal  Diet Order:  Diet Order             Diet regular Room service appropriate? Yes; Fluid consistency: Thin  Diet effective now                  Pt is also offered choice of unit snacks mid-morning and mid-afternoon.  Pt is eating as desired.   Lab results and medications reviewed.     07/04/14, MS, RD, LDN, CNSC Inpatient Clinical Dietitian RD pager # available in [facility]  After hours/weekend pager # available in [facility]

## 2021-02-24 NOTE — Progress Notes (Signed)
[facility] MD Progress Note  02/24/2021 4:15 PM Jeffery Baldwin  MRN:  [id number]  Reason for admission:  Jeffery Baldwin is a 30 year old male with history of prior treatment for ADHD, MDD, GAD, paranoia and history of substance abuse (THC, Klonopin, tramadol, Roxicodone) including remote history of heroin use disorder who presented to [facility] saying he needed to be committed due to feeling he is being "gaslighted, tortured and trust broken" and presenting with disorganized thought processes, paranoid delusions, dj vu experiences, trouble sleeping, hallucinations and suicidal ideation.  UDS was positive for THC.  Objective: Medical record reviewed.  Patient's case discussed in detail with members of the treatment team and nursing staff.  I met with and evaluated the patient on the unit today for follow-up.  Patient's presentation is similar to yesterday.  He reports anxious depressed mood and displays constricted affect.  Thought processes are perhaps a bit more organized than they were yesterday.  Patient continues to make delusional statements with paranoid themes and reports dj vu experiences.  He states belief that he is being psychologically tortured or "pranked."  Patient says that he has been reliving the same situations and same conversations over and over again starting 20 months ago when he was released from jail.  Patient says he had bad conversations with others and feels he is now living out the experiences of others.  Patient talks about traumatic events over the last 2 years including the death of his mother in 09-18-18 and his grandparent in May 2021.  Patient expresses uncertainty as to whether his mother really passed away or whether he is being pranked or lied to about her death.  He reports feeling depressed about his situation He says he feels he has "ruined opportunities" but cannot elaborate on this statement.  Patient denies thoughts of harming himself in the hospital.  He denies AI or HI.   He denies paranoid concerns about others in the hospital but does report paranoid concerns about individuals outside the hospital hacking his phone and accounts in controlling his experience.  He denies auditory hallucinations or thought broadcasting today.  Patient says that he feels more lethargic and is having trouble focusing since taking olanzapine.  We discussed change from olanzapine to risperidone which would be less likely to cause him lethargy.  Patient states agreement with this plan.  He says that he is eating and sleeping well.  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  Patient denies other physical symptoms.  Staff document that he slept 8.25 hours last night.  No new labs today.  Vital signs this morning include BP 103/78 sitting and 115/82 standing; pulse 62 sitting and 72 standing; respirations 16; O2 sat 100% on room air and temperature 98.3.  Vital signs at noon today include BP 121/80 and pulse 83.  Staff document the patient is taking scheduled medications as prescribed.  Staff document the patient was visible on the unit last evening and reported that he was feeling better but seemed to be minimizing his symptoms.  Patient has been attending groups.  Staff document the patient has made statements about being "programmed."  Principal Problem: Psychosis, 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Jeffery Baldwin) Diagnosis: Principal Problem:   Psychosis, 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Jeffery Baldwin) Active Problems:   Generalized anxiety disorder   Mild episode of recurrent major depressive disorder (Jeffery Baldwin)   Paranoia (Jeffery Baldwin)  Substance use disorder  Total Time spent with patient:  25 minutes  Past Psychiatric History: See admission H&P  Past Medical History:  Past Medical History:  Diagnosis Date   Chronic pain syndrome    Heroin abuse ([place])    Nerve root avulsion    Spinal cord injury at C5-C7 level  without injury of spinal bone (Jeffery Baldwin) 2011    Past Surgical History:  Procedure Laterality Date   nerve root repair     Family History: History reviewed. No pertinent family history. Family Psychiatric  History: See admission H&P Social History:  Social History   Substance and Sexual Activity  Alcohol Use Yes   Alcohol/week: 0.0 standard drinks   Comment: occasional     Social History   Substance and Sexual Activity  Drug Use Yes   Comment: heroine    Social History   Socioeconomic History   Marital status: Single    Spouse name: Not on file   Number of children: Not on file   Years of education: Not on file   Highest education level: Not on file  Occupational History   Not on file  Tobacco Use   Smoking status: Some Days    Packs/day: 0.20    Years: 7.00    Pack years: 1.40    Types: Cigarettes   Smokeless tobacco: Never   Tobacco comments:    decreased smoking  Substance and Sexual Activity   Alcohol use: Yes    Alcohol/week: 0.0 standard drinks    Comment: occasional   Drug use: Yes    Comment: heroine   Sexual activity: Yes  Other Topics Concern   Not on file  Social History Narrative   ** Merged History Encounter **       Social Determinants of Health   Financial Resource Strain: Not on file  Food Insecurity: Not on file  Transportation Needs: Not on file  Physical Activity: Not on file  Stress: Not on file  Social Connections: Not on file   Additional Social History:                         Sleep: Good  Appetite:  Fair  Current Medications: Current Facility-Administered Medications  Medication Dose Route Frequency Provider Last Rate Last Admin   acetaminophen (TYLENOL) tablet 650 mg  650 mg Oral Q6H PRN Prescilla Sours, PA-C   650 mg at 02/24/21 0824   alum & mag hydroxide-simeth (MAALOX/MYLANTA) 200-200-20 MG/5ML suspension 30 mL  30 mL Oral Q4H PRN Margorie John W, PA-C       benztropine (COGENTIN) tablet 0.5 mg  0.5 mg Oral Q6H  PRN Arthor Captain, MD       diphenhydrAMINE (BENADRYL) injection 50 mg  50 mg Intramuscular Q6H PRN Arthor Captain, MD       feeding supplement (BOOST / RESOURCE BREEZE) liquid 1 Container  1 Container Oral Q24H Harlow Asa, MD   1 Container at 02/24/21 1055   feeding supplement (ENSURE ENLIVE / ENSURE PLUS) liquid 237 mL  237 mL Oral Q24H Nelda Marseille, Amy E, MD       hydrOXYzine (ATARAX/VISTARIL) tablet 25 mg  25 mg Oral TID PRN Prescilla Sours, PA-C   25 mg at 02/23/21 2037   loperamide (IMODIUM) capsule 2-4 mg  2-4 mg Oral PRN Arthor Captain, MD       LORazepam (ATIVAN) tablet 1 mg  1 mg Oral Q6H PRN Ethelene Browns  L, MD       risperiDONE (RISPERDAL M-TABS) disintegrating tablet 2 mg  2 mg Oral Q8H PRN Arthor Captain, MD       And   LORazepam (ATIVAN) tablet 1 mg  1 mg Oral PRN Arthor Captain, MD       And   ziprasidone (GEODON) injection 20 mg  20 mg Intramuscular PRN Arthor Captain, MD       magnesium hydroxide (MILK OF MAGNESIA) suspension 30 mL  30 mL Oral Daily PRN Margorie John W, PA-C       multivitamin with minerals tablet 1 tablet  1 tablet Oral Daily Arthor Captain, MD   1 tablet at 02/24/21 4035   nicotine polacrilex (NICORETTE) gum 2 mg  2 mg Oral PRN Prescilla Sours, PA-C   2 mg at 02/24/21 1601   ondansetron (ZOFRAN-ODT) disintegrating tablet 4 mg  4 mg Oral Q6H PRN Arthor Captain, MD       risperiDONE (RISPERDAL M-TABS) disintegrating tablet 2 mg  2 mg Oral QHS Arthor Captain, MD       thiamine tablet 100 mg  100 mg Oral Daily Arthor Captain, MD   100 mg at 02/24/21 2481   traZODone (DESYREL) tablet 50 mg  50 mg Oral QHS PRN Prescilla Sours, PA-C        Lab Results:  Results for orders placed or performed during the hospital encounter of 02/22/21 (from the past 48 hour(s))  POCT Urine Drug Screen - ([facility])     Status: Abnormal   Collection Time: 02/22/21  7:40 PM  Result Value Ref Range   POC Amphetamine UR None Detected NONE DETECTED (Cut Off Level 1000 ng/mL)    POC Secobarbital (BAR) None Detected NONE DETECTED (Cut Off Level 300 ng/mL)   POC Buprenorphine (BUP) None Detected NONE DETECTED (Cut Off Level 10 ng/mL)   POC Oxazepam (BZO) None Detected NONE DETECTED (Cut Off Level 300 ng/mL)   POC Cocaine UR None Detected NONE DETECTED (Cut Off Level 300 ng/mL)   POC Methamphetamine UR None Detected NONE DETECTED (Cut Off Level 1000 ng/mL)   POC Morphine None Detected NONE DETECTED (Cut Off Level 300 ng/mL)   POC Oxycodone UR None Detected NONE DETECTED (Cut Off Level 100 ng/mL)   POC Methadone UR None Detected NONE DETECTED (Cut Off Level 300 ng/mL)   POC Marijuana UR Positive (A) NONE DETECTED (Cut Off Level 50 ng/mL)  Resp Panel by RT-PCR (Flu A&B, Covid) Nasopharyngeal Swab     Status: None   Collection Time: 02/22/21  7:55 PM   Specimen: Nasopharyngeal Swab; Nasopharyngeal(NP) swabs in vial transport medium  Result Value Ref Range   SARS Coronavirus 2 by RT PCR NEGATIVE NEGATIVE    Comment: (NOTE) SARS-CoV-2 target nucleic acids are NOT DETECTED.  The SARS-CoV-2 RNA is generally detectable in upper respiratory specimens during the acute phase of infection. The lowest concentration of SARS-CoV-2 viral copies this assay can detect is 138 copies/mL. A negative result does not preclude SARS-Cov-2 infection and should not be used as the sole basis for treatment or other patient management decisions. A negative result may occur with  improper specimen collection/handling, submission of specimen other than nasopharyngeal swab, presence of viral mutation(s) within the areas targeted by this assay, and inadequate number of viral copies(<138 copies/mL). A negative result must be combined with clinical observations, patient history, and epidemiological information. The expected result is Negative.  Fact Sheet for Patients:  EntrepreneurPulse.com.au  Fact Sheet for Healthcare Providers:   IncredibleEmployment.be  This test is no t yet approved or cleared by the [place] FDA and  has been authorized for detection and/or diagnosis of SARS-CoV-2 by FDA under an Emergency Use Authorization (EUA). This EUA will remain  in effect (meaning this test can be used) for the duration of the COVID-19 declaration under Section 564(b)(1) of the Act, 21 U.S.C.section 360bbb-3(b)(1), unless the authorization is terminated  or revoked sooner.       Influenza A by PCR NEGATIVE NEGATIVE   Influenza B by PCR NEGATIVE NEGATIVE    Comment: (NOTE) The Xpert Xpress SARS-CoV-2/FLU/RSV plus assay is intended as an aid in the diagnosis of influenza from Nasopharyngeal swab specimens and should not be used as a sole basis for treatment. Nasal washings and aspirates are unacceptable for Xpert Xpress SARS-CoV-2/FLU/RSV testing.  Fact Sheet for Patients: EntrepreneurPulse.com.au  Fact Sheet for Healthcare Providers: IncredibleEmployment.be  This test is not yet approved or cleared by the [place] FDA and has been authorized for detection and/or diagnosis of SARS-CoV-2 by FDA under an Emergency Use Authorization (EUA). This EUA will remain in effect (meaning this test can be used) for the duration of the COVID-19 declaration under Section 564(b)(1) of the Act, 21 U.S.C. section 360bbb-3(b)(1), unless the authorization is terminated or revoked.  Performed at [facility], [place] [address], [place], [place] [address]   POC SARS Coronavirus 2 Ag-ED - Nasal Swab     Status: Normal (Preliminary result)   Collection Time: 02/22/21  7:55 PM  Result Value Ref Range   SARS Coronavirus 2 Ag Negative Negative  Urinalysis, Complete w Microscopic Urine, Clean Catch     Status: Abnormal   Collection Time: 02/22/21  7:56 PM  Result Value Ref Range   Color, Urine YELLOW YELLOW   APPearance CLEAR CLEAR   Specific Gravity, Urine <1.005  (L) 1.005 - 1.030   pH 7.0 5.0 - 8.0   Glucose, UA NEGATIVE NEGATIVE mg/dL   Hgb urine dipstick NEGATIVE NEGATIVE   Bilirubin Urine NEGATIVE NEGATIVE   Ketones, ur NEGATIVE NEGATIVE mg/dL   Protein, ur NEGATIVE NEGATIVE mg/dL   Nitrite NEGATIVE NEGATIVE   Leukocytes,Ua NEGATIVE NEGATIVE   Squamous Epithelial / LPF NONE SEEN 0 - 5   WBC, UA 0-5 0 - 5 WBC/hpf   RBC / HPF 0-5 0 - 5 RBC/hpf   Bacteria, UA NONE SEEN NONE SEEN    Comment: Performed at [facility], [address], [place], [place] [address]  CBC with Differential/Platelet     Status: Abnormal   Collection Time: 02/22/21  8:00 PM  Result Value Ref Range   WBC 8.0 4.0 - 10.5 K/uL   RBC 5.41 4.22 - 5.81 MIL/uL   Hemoglobin 17.5 (H) 13.0 - 17.0 g/dL   HCT 49.6 39.0 - 52.0 %   MCV 91.7 80.0 - 100.0 fL   MCH 32.3 26.0 - 34.0 pg   MCHC 35.3 30.0 - 36.0 g/dL   RDW 11.9 11.5 - 15.5 %   Platelets 194 150 - 400 K/uL   nRBC 0.0 0.0 - 0.2 %   Neutrophils Relative % 63 %   Neutro Abs 5.0 1.7 - 7.7 K/uL   Lymphocytes Relative 31 %   Lymphs Abs 2.5 0.7 - 4.0 K/uL   Monocytes Relative 5 %   Monocytes Absolute 0.4 0.1 - 1.0 K/uL   Eosinophils Relative 1 %   Eosinophils Absolute 0.1 0.0 -  0.5 K/uL   Basophils Relative 0 %   Basophils Absolute 0.0 0.0 - 0.1 K/uL   Immature Granulocytes 0 %   Abs Immature Granulocytes 0.02 0.00 - 0.07 K/uL    Comment: Performed at [facility], [place] [address] [place], [place] [address]  Comprehensive metabolic panel     Status: Abnormal   Collection Time: 02/22/21  8:00 PM  Result Value Ref Range   Sodium 139 135 - 145 mmol/L   Potassium 4.0 3.5 - 5.1 mmol/L   Chloride 101 98 - 111 mmol/L   CO2 28 22 - 32 mmol/L   Glucose, Bld 89 70 - 99 mg/dL    Comment: Glucose reference range applies only to samples taken after fasting for at least 8 hours.   BUN <5 (L) 6 - 20 mg/dL   Creatinine, Ser 0.82 0.61 - 1.24 mg/dL   Calcium 10.1 8.9 - 10.3 mg/dL   Total Protein 7.6 6.5 - 8.1 g/dL    Albumin 5.3 (H) 3.5 - 5.0 g/dL   AST 40 15 - 41 U/L   ALT 58 (H) 0 - 44 U/L   Alkaline Phosphatase 57 38 - 126 U/L   Total Bilirubin 1.1 0.3 - 1.2 mg/dL   GFR, Estimated >60 >60 mL/min    Comment: (NOTE) Calculated using the CKD-EPI Creatinine Equation (2021)    Anion gap 10 5 - 15    Comment: Performed at [facility] [address], [place], [place] [address]  Hemoglobin A1c     Status: None   Collection Time: 02/22/21  8:00 PM  Result Value Ref Range   Hgb A1c MFr Bld 4.9 4.8 - 5.6 %    Comment: (NOTE) Pre diabetes:          5.7%-6.4%  Diabetes:              >6.4%  Glycemic control for   <7.0% adults with diabetes    Mean Plasma Glucose 93.93 mg/dL    Comment: Performed at [facility] [address], [place], [place] [address]  Magnesium     Status: None   Collection Time: 02/22/21  8:00 PM  Result Value Ref Range   Magnesium 2.1 1.7 - 2.4 mg/dL    Comment: Performed at [facility], [place] [address], [place], [place] [address]  Ethanol     Status: None   Collection Time: 02/22/21  8:00 PM  Result Value Ref Range   Alcohol, Ethyl (B) <10 <10 mg/dL    Comment: (NOTE) Lowest detectable limit for serum alcohol is 10 mg/dL.  For medical purposes only. Performed at [facility], [place] [address], [place], [place] [address]   RPR     Status: None   Collection Time: 02/22/21  8:00 PM  Result Value Ref Range   RPR Ser Ql NON REACTIVE NON REACTIVE    Comment: Performed at [facility], [place] [address], [place], [place] [address]  Hepatitis panel, acute     Status: Abnormal   Collection Time: 02/22/21  8:00 PM  Result Value Ref Range   Hepatitis B Surface Ag NON REACTIVE NON REACTIVE   HCV Ab Reactive (A) NON REACTIVE    Comment: (NOTE) The CDC recommends that a Reactive HCV antibody result be followed up  with a HCV Nucleic Acid Amplification test.     Hep A IgM NON REACTIVE NON REACTIVE   Hep B C IgM NON REACTIVE NON  REACTIVE     Comment: Performed at [facility], [place] [address], [place], [place] [address]  HIV Antibody (routine testing w rflx)     Status: None   Collection Time: 02/22/21  8:00 PM  Result Value Ref Range   HIV Screen 4th Generation wRfx Non Reactive Non Reactive    Comment: Performed at [facility], [place] [address] , [place] [address]  Lipid panel     Status: None   Collection Time: 02/22/21  8:00 PM  Result Value Ref Range   Cholesterol 166 0 - 200 mg/dL   Triglycerides 42 <150 mg/dL   HDL 93 >40 mg/dL   Total CHOL/HDL Ratio 1.8 RATIO   VLDL 8 0 - 40 mg/dL   LDL Cholesterol 65 0 - 99 mg/dL    Comment:        Total Cholesterol/HDL:CHD Risk Coronary Heart Disease Risk Table                     Men   Women  1/2 Average Risk   3.4   3.3  Average Risk       5.0   4.4  2 X Average Risk   9.6   7.1  3 X Average Risk  23.4   11.0        Use the calculated Patient Ratio above and the CHD Risk Table to determine the patient's CHD Risk.        ATP III CLASSIFICATION (LDL):  <100     mg/dL   Optimal  100-129  mg/dL   Near or Above                    Optimal  130-159  mg/dL   Borderline  160-189  mg/dL   High  >190     mg/dL   Very High Performed at [facility] [address] [place],  [address]   TSH     Status: None   Collection Time: 02/22/21  8:00 PM  Result Value Ref Range   TSH 0.503 0.350 - 4.500 uIU/mL    Comment: Performed by a 3rd Generation assay with a functional sensitivity of <=0.01 uIU/mL. Performed at [facility], [place] [address] [place],  [address]   POC SARS Coronavirus 2 Ag     Status: None   Collection Time: 02/22/21  8:08 PM  Result Value Ref Range   SARSCOV2ONAVIRUS 2 AG NEGATIVE NEGATIVE    Comment: (NOTE) SARS-CoV-2 antigen NOT DETECTED.   Negative results are presumptive.  Negative results do not preclude SARS-CoV-2 infection and should not be used as the sole basis for treatment or other patient management  decisions, including infection  control decisions, particularly in the presence of clinical signs and  symptoms consistent with COVID-19, or in those who have been in contact with the virus.  Negative results must be combined with clinical observations, patient history, and epidemiological information. The expected result is Negative.  Fact Sheet for Patients: HandmadeRecipes.com.cy  Fact Sheet for Healthcare Providers: FuneralLife.at  This test is not yet approved or cleared by the [place] FDA and  has been authorized for detection and/or diagnosis of SARS-CoV-2 by FDA under an Emergency Use Authorization (EUA).  This EUA will remain in effect (meaning this test can be used) for the duration of  the COV ID-19 declaration under Section 564(b)(1) of the Act, 21 U.S.C. section 360bbb-3(b)(1), unless the authorization  is terminated or revoked sooner.      Blood Alcohol level:  Lab Results  Component Value Date   ETH <10 02/22/2021   ETH <10 75/88/3254    Metabolic Disorder Labs: Lab Results  Component Value Date   HGBA1C 4.9 02/22/2021   MPG 93.93 02/22/2021   No results found for: PROLACTIN Lab Results  Component Value Date   CHOL 166 02/22/2021   TRIG 42 02/22/2021   HDL 93 02/22/2021   CHOLHDL 1.8 02/22/2021   VLDL 8 02/22/2021   LDLCALC 65 02/22/2021    Physical Findings: AIMS:  , ,  ,  ,    CIWA:  CIWA-Ar Total: 0 COWS:     Musculoskeletal: Strength & Muscle Tone:  Atrophy, weakness/paralysis of right upper extremity; otherwise WNL Gait & Station: normal Patient leans: N/A  Psychiatric Specialty Exam:  Presentation  General Appearance: Fairly Groomed  Eye Contact:Fair  Speech:Clear and Coherent; Normal Rate  Speech Volume:Normal  Handedness:Right   Mood and Affect  Mood:Anxious; Dysphoric; Depressed  Affect:Non-Congruent   Thought Process  Thought Processes:Disorganized  Descriptions  of Associations:Tangential  Orientation:Full (Time, Place and Person)  Thought Content:Illogical; Paranoid Ideation; Delusions (Dj vu experiences)  History of Schizophrenia/Schizoaffective disorder:No  Duration of Psychotic Symptoms:Greater than six months  Hallucinations:Hallucinations: Other (comment) (Denies hallucinations today) Description of Auditory Hallucinations: Voices of people repeating what I say  Ideas of Reference:Paranoia  Suicidal Thoughts:Suicidal Thoughts: No SI Passive Intent and/or Plan: Without Intent; Without Plan  Homicidal Thoughts:Homicidal Thoughts: No HI Passive Intent and/or Plan: Without Intent; Without Plan   Sensorium  Memory:Immediate Fair; Recent Fair; Remote Fair  Judgment:Impaired  Insight:Shallow   Executive Functions  Concentration:Fair  Attention Span:Fair  [place]  Language:Good   Psychomotor Activity  Psychomotor Activity:Psychomotor Activity: Normal   Assets  Assets:Desire for Improvement; Housing   Sleep  Sleep:Sleep: Good Number of Hours of Sleep: 8.25    Physical Exam: Physical Exam Vitals and nursing note reviewed.  Constitutional:      General: He is not in acute distress.    Appearance: Normal appearance. He is not diaphoretic.  HENT:     Head: Normocephalic and atraumatic.  Cardiovascular:     Rate and Rhythm: Normal rate.  Pulmonary:     Effort: Pulmonary effort is normal.  Neurological:     General: No focal deficit present.     Mental Status: He is alert and oriented to person, place, and time.     Motor: Weakness and atrophy present. No tremor.     Comments: Right upper extremity weakness, atrophy secondary to nerve root injury in 2010   Review of Systems  Constitutional:  Negative for chills, diaphoresis and fever.  HENT:  Negative for sore throat.   Respiratory:  Negative for cough and shortness of breath.   Cardiovascular:  Negative for chest pain and  palpitations.  Gastrointestinal:  Negative for constipation, diarrhea, nausea and vomiting.  Genitourinary:  Negative for dysuria.  Musculoskeletal:  Negative for myalgias.       Positive for chronic right upper extremity pain, weakness and atrophy secondary to nerve root injury in 2010  Neurological:  Positive for focal weakness. Negative for dizziness, tremors and headaches.       Positive for chronic right upper extremity pain, weakness and atrophy secondary to nerve root injury in 2010  Psychiatric/Behavioral:  Positive for depression and substance abuse. Negative for hallucinations and suicidal ideas. The patient is nervous/anxious. The patient does not have insomnia.  Blood pressure 121/80, pulse 83, temperature 98.3 F (36.8 C), temperature source Oral, resp. rate 16, height $RemoveBe'5\' 11"'CSIdIBoak$  (1.803 m), weight 64.6 kg, SpO2 100 %. Body mass index is 19.87 kg/m.   Treatment Plan Summary: The patient is a 30 year old male with prior diagnoses of ADHD, anxiety, MDD, substance abuse and history of paranoia who was admitted with worsening psychotic symptoms and suicidal ideation in the context of substance use (THC, tramadol, Klonopin).  Differential diagnosis includes substance-induced psychotic disorder versus primary psychotic disorder versus mood disorder with psychotic features.  Patient requires continued inpatient psychiatric hospitalization for treatment and stabilization of psychotic symptoms.  Daily contact with patient to assess and evaluate symptoms and progress in treatment and Medication management  Psychosis, unspecified type -Discontinue olanzapine due to poor tolerability and side effects of fatigue -Start Risperdal 1 mg QAM and 2 mg QHS.  Anticipate further upward titration. -Start benztropine 0.5 mg PO Q6H PRN tremors, muscle stiffness -Start diphenhydramine 50 mg IM Q6H PRN acute dystonic reaction -CT of the head has been ordered for 02/25/2021 as part of work-up for new onset  psychosis  Insomnia -Continue trazodone 50 mg at bedtime PRN  Anxiety -Continue hydroxyzine 25 mg 3 times daily PRN  Polysubstance use disorder (THC, Klonopin, tramadol) -Continue CIWA protocol with comfort meds and PRN lorazepam for CIWA >10 to cover for any possible signs or symptoms of benzodiazepine withdrawal.  Patient is not currently displaying signs or symptoms of benzodiazepine withdrawal. -Have deferred initiation of COWS protocol for now.  Patient reported taking tramadol that was not prescribed for him prior to admission but was vague about amounts, frequency, etc.  Patient is not currently displaying any signs or symptoms of opiate withdrawal. -Patient would benefit from participation in substance abuse treatment program after discharge  Reactive HCV antibody test -HCV RNA diagnoses, NAA lab test ordered to be collected with a.m. draw tomorrow 02/25/2021  Nerve pain of right upper extremity secondary to nerve root injury sustained 2010 -Start gabapentin 100 mg 3 times daily  Additional labs ordered with a.m. draw on 02/25/2021: Ammonia level, ANA, sed rate.  Discharge planning in progress.       Arthor Captain, MD 02/24/2021, 4:15 PM

## 2021-02-24 NOTE — Progress Notes (Signed)
Pt visible in the dayroom, pt very anxious and disorganized at times , but stated he was feeling a little better    02/24/21 2100  Psych Admission Type (Psych Patients Only)  Admission Status Voluntary  Psychosocial Assessment  Patient Complaints Anxiety;Depression  Eye Contact Fair  Facial Expression Flat  Affect Appropriate to circumstance  Speech Logical/coherent  Interaction Assertive  Motor Activity Slow ('I'm tired /fatigued)  Appearance/Hygiene Unremarkable  Behavior Characteristics Cooperative  Mood Anxious  Aggressive Behavior  Effect No apparent injury  Thought Process  Coherency WDL  Content WDL  Delusions None reported or observed  Perception WDL  Hallucination None reported or observed  Judgment Impaired  Confusion None  Danger to Self  Current suicidal ideation? Denies  Self-Injurious Behavior No self-injurious ideation or behavior indicators observed or expressed   Agreement Not to Harm Self Yes  Description of Agreement Verbal contract  Danger to Others  Danger to Others None reported or observed

## 2021-02-24 NOTE — [facility] Group Notes (Signed)
Adult Psychoeducational Group Note  Date:  02/24/2021 Time:  10:30 AM  Group Topic/Focus:  Goals Group:   The focus of this group is to help patients establish daily goals to achieve during treatment and discuss how the patient can incorporate goal setting into their daily lives to aide in recovery.  Participation Level:  Active  Participation Quality:  Appropriate  Affect:  Appropriate  Cognitive:  Appropriate  Insight: Appropriate  Engagement in Group:  Engaged  Modes of Intervention:  Discussion  Additional Comments:    Donell Beers 02/24/2021, 10:30 AM

## 2021-02-24 NOTE — Progress Notes (Signed)
Pt continues to report being "fatigue and tired" throughout this shift. Observed to be very suspicious and paranoid this evening about his care and events leading to admission "why y'all keep taking all this blood from me? It must not be good. I've given enough already". Per pt "I know I'm asking you too much questions but when people are taping into my devices which I know for sure because I found out on my ID on my phone then now all this blood it's too much" prior to dinner. Rates his anxiety, depression and hopelessness all 5/10 but declined PRN when offered. Pt received PRN Tylenol 650 mg PO at 0824 for complain of headache & right arm pain. Reports relief when reassessed at 0920. Denies SI, HI and AVH when assessed.  Support and encouragement offered to pt. All medications given with verbal education and effects monitored. Writer provided education to patient on the importance of labs and reason of repeat labs as well. Safety checks maintained on and off unit without self harm gestures or outburst to note thus far. Pt remains preoccupied about labs despite verbal education. Interacts well with peers and staff. Denies discomfort at this time.

## 2021-02-24 NOTE — Plan of Care (Signed)
With patient's permission, I attempted to make direct phone contact today with patient's father Jeffery Baldwin at the number patient provided [phone]).  There was no answer and no voicemail box was set up to allow me to leave a voicemail.

## 2021-02-24 NOTE — [facility] Group Notes (Signed)
Adult Psychoeducational Group Note  Date:  02/24/2021 Time:  5:00 PM  Group Topic/Focus:  Making Healthy Choices:   The focus of this group is to help patients identify negative/unhealthy choices they were using prior to admission and identify positive/healthier coping strategies to replace them upon discharge.  Participation Level:  Active  Participation Quality:  Appropriate  Affect:  Appropriate  Cognitive:  Appropriate  Insight: Good  Engagement in Group:  Engaged  Modes of Intervention:  Discussion  Additional Comments:    Donell Beers 02/24/2021, 5:00 PM

## 2021-02-24 NOTE — Progress Notes (Signed)
Did not attend Relaxation Group 

## 2021-02-24 NOTE — Group Note (Signed)
Recreation Therapy Group Note   Group Topic:Coping Skills  Group Date: 02/24/2021 Start Time: 1000 End Time: 1025 Facilitators: Caroll Rancher, LRT/CTRS Location: [address] Dayroom  Goal Area(s) Addresses: Patient will define what a coping skill is. Patient will create a list of healthy coping skills beginning with each letter of the alphabet. Patient will successfully identify positive coping skills they can use post d/c.  Patient will acknowledge benefit(s) of using learned coping skills post d/c.   Group Description:  Coping A to Z. Patient asked to identify what a coping skill is and when they use them. Patients with Clinical research associate discussed healthy versus unhealthy coping skills. Next patients were given a blank worksheet titled "Coping Skills A-Z". Partners were instructed to come up with at least one positive coping skill per letter of the alphabet.  Patients were given 15 minutes to brainstorm and create their list before ideas were presented to the large group. Patients and LRT debriefed on the importance of coping skill selection based on situation and back-up plans when a skill tried is not effective.     Affect/Mood: Appropriate   Participation Level: Active and Engaged   Participation Quality: Independent   Behavior: Appropriate   Speech/Thought Process: Organized   Insight: Good   Judgement: Good   Modes of Intervention: Worksheet   Patient Response to Interventions:  Attentive and Engaged   Education Outcome:  Acknowledges education and In group clarification offered    Clinical Observations/Individualized Feedback: Pt was able to come up coping skills such as arrange priorities, drive around the block, exercise, meditate, prayer, take a moment to think and talk to someone.  Pt expressed coming up with positive coping skills was difficult but couldn't explain why.  Pt stated "I don't my brain is all over the place".  However, when asked about negative coping strategies,  pt expressed those were easier to come up with because "just programmed that way".  Pt was able to follow along with activity, didn't seem to be distracted at any point and was attentive throughout.    Plan: Continue to engage patient in RT group sessions 2-3x/week.   Caroll Rancher, LRT/CTRS 02/24/2021 12:29 PM

## 2021-02-25 ENCOUNTER — Ambulatory Visit (HOSPITAL_COMMUNITY)
Admission: AD | Admit: 2021-02-25 | Discharge: 2021-02-25 | Disposition: A | Discharge status: Home / Self Care | Payer: Federal, State, Local not specified - Other | Source: Intra-hospital | Attending: Behavioral Health | Admitting: Behavioral Health

## 2021-02-25 DIAGNOSIS — F29 Unspecified psychosis not due to a substance or known physiological condition: Secondary | ICD-10-CM | POA: Diagnosis not present

## 2021-02-25 LAB — AMMONIA: Ammonia: 30 umol/L (ref 9–35)

## 2021-02-25 MED ORDER — TRAZODONE HCL 50 MG PO TABS
25.0000 mg | ORAL_TABLET | Freq: Every evening | ORAL | Status: DC | PRN
  Administered 2021-02-25: 25 mg via ORAL
  Filled 2021-02-25: qty 1

## 2021-02-25 MED ORDER — GABAPENTIN 300 MG PO CAPS
300.0000 mg | ORAL_CAPSULE | Freq: Three times a day (TID) | ORAL | Status: DC
  Administered 2021-02-25 – 2021-03-03 (×18): 300 mg via ORAL
  Filled 2021-02-25 (×24): qty 1

## 2021-02-25 MED ORDER — GABAPENTIN 300 MG PO CAPS
300.0000 mg | ORAL_CAPSULE | ORAL | Status: AC
  Administered 2021-02-25: 300 mg via ORAL
  Filled 2021-02-25 (×2): qty 1

## 2021-02-25 MED ORDER — RISPERIDONE 2 MG PO TBDP
2.0000 mg | ORAL_TABLET | Freq: Every day | ORAL | Status: DC
  Administered 2021-02-26 – 2021-02-27 (×2): 2 mg via ORAL
  Filled 2021-02-25 (×5): qty 1

## 2021-02-25 NOTE — [facility] Group Notes (Signed)
Pt did not attend group. 

## 2021-02-25 NOTE — Progress Notes (Signed)
   02/25/21 0900  Psych Admission Type (Psych Patients Only)  Admission Status Voluntary  Psychosocial Assessment  Patient Complaints Anxiety  Eye Contact Fair  Facial Expression Flat  Affect Appropriate to circumstance  Speech Logical/coherent  Interaction Assertive  Motor Activity Slow ('I'm tired /fatigued)  Appearance/Hygiene Unremarkable  Behavior Characteristics Cooperative  Mood Anxious  Aggressive Behavior  Effect No apparent injury  Thought Process  Coherency WDL  Content WDL  Delusions None reported or observed  Perception WDL  Hallucination None reported or observed  Judgment Impaired  Confusion None  Danger to Self  Current suicidal ideation? Denies  Self-Injurious Behavior No self-injurious ideation or behavior indicators observed or expressed   Agreement Not to Harm Self Yes  Description of Agreement Verbal contract  Danger to Others  Danger to Others None reported or observed  Dar Note: Patient presents with a blunted affect and anxious mood.  Denies suicidal thoughts, auditory and visual hallucinations.  Medications given as prescribed.  Routine safety checks maintained on and off the unit.

## 2021-02-25 NOTE — Progress Notes (Signed)
Pt did not attend orientation/goals group. 

## 2021-02-25 NOTE — Progress Notes (Signed)
[facility] MD Progress Note  02/25/2021 12:57 PM Jeffery Baldwin  MRN:  [id number]  Reason for admission:  Jeffery Baldwin is a 30 year old male with history of prior treatment for ADHD, MDD, GAD, paranoia and history of substance abuse (THC, Klonopin, tramadol, Roxicodone) including remote history of heroin use disorder who presented to [facility] saying he needed to be committed due to feeling he is being "gaslighted, tortured and trust broken" and presenting with disorganized thought processes, paranoid delusions, dj vu experiences, trouble sleeping, hallucinations and suicidal ideation.  UDS was positive for THC.  Objective: Medical record reviewed.  Patient's case discussed in detail with members of the treatment team and nursing staff.  I met with and evaluated the patient on the unit today for follow-up.  Patient seems less guarded and less anxious today than on admission.  His thought processes are more organized overall but still disorganized at times.  He continues to report having dj vu experiences of reliving the same events and conversations and paranoid delusional concerns that his electronic devices are being hacked, he is being "punked" or the outcomes of his life are being controlled.  Patient again states today that he believes that his mother and grandfather's deaths in 2020 and 2021 were not real and that he was lied to about this information as part of a prank.  He states concerns that his bank account and phone have been hacked.  Patient says his mood is intermittently down about these events but he does not want to take an antidepressant.  He is sleeping and eating okay and feels less tired this morning after olanzapine was discontinued and risperidone initiated last night.  He denies SI, AI, HI, AH or VH.  Patient states that people are treating him okay in the hospital.  I discussed the results of his lab tests and recommended that he should be seen outside the hospital for follow-up regarding  hepatitis C and possible treatment for it.  Patient says that gabapentin has been somewhat helpful for his right arm pain.  He is receptive to the dose being increased.  He denies medication side effects other than increased appetite.  Patient denies any symptoms of withdrawal from benzodiazepines or opiates.  He denies any physical issues other than chronic right upper extremity pain.  We discussed the head CT that has been ordered to be performed today.  Patient states he is willing to have a head CT performed.  Staff document 6.25 hours of sleep last night.  Vital signs morning include BP 112/58 sitting and 97/76 standing; pulse 76 sitting and 93 standing and temperature 97.3.  Labs results this morning include ammonia level 30, sed rate 1.  ANA and HCV RNA diagnosis, and ANA still pending.  CIWA scores over the last 24 hours have been between 0 and 3 with patient scoring only for anxiety.  Patient is taking scheduled medications as prescribed.  He took PRN hydroxyzine 25 mg yesterday evening for anxiety and PRN trazodone 50 mg last night for sleep.  Staff document that patient appeared suspicious and paranoid on the unit last evening and made comments about his electronic devices being hacked.  Principal Problem: Psychosis, 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[place]) Diagnosis: Principal Problem:   Psychosis, 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[place]) Active Problems:   Generalized anxiety disorder   Mild episode of recurrent major depressive disorder ([place])   Paranoia ([place])   Substance use disorder  Total Time spent with patient:  25 minutes  Past Psychiatric History: See  admission H&P  Past Medical History:  Past Medical History:  Diagnosis Date   Chronic pain syndrome    Heroin abuse ([place])    Nerve root avulsion    Spinal cord injury at C5-C7 level without injury of spinal bone ([place]) 2011    Past Surgical History:  Procedure Laterality Date   nerve root repair     Family History: History reviewed. No  pertinent family history. Family Psychiatric  History: See admission H&P Social History:  Social History   Substance and Sexual Activity  Alcohol Use Yes   Alcohol/week: 0.0 standard drinks   Comment: occasional     Social History   Substance and Sexual Activity  Drug Use Yes   Comment: heroine    Social History   Socioeconomic History   Marital status: Single    Spouse name: Not on file   Number of children: Not on file   Years of education: Not on file   Highest education level: Not on file  Occupational History   Not on file  Tobacco Use   Smoking status: Some Days    Packs/day: 0.20    Years: 7.00    Pack years: 1.40    Types: Cigarettes   Smokeless tobacco: Never   Tobacco comments:    decreased smoking  Substance and Sexual Activity   Alcohol use: Yes    Alcohol/week: 0.0 standard drinks    Comment: occasional   Drug use: Yes    Comment: heroine   Sexual activity: Yes  Other Topics Concern   Not on file  Social History Narrative   ** Merged History Encounter **       Social Determinants of Health   Financial Resource Strain: Not on file  Food Insecurity: Not on file  Transportation Needs: Not on file  Physical Activity: Not on file  Stress: Not on file  Social Connections: Not on file   Additional Social History:                         Sleep: Good  Appetite:  Fair  Current Medications: Current Facility-Administered Medications  Medication Dose Route Frequency Provider Last Rate Last Admin   acetaminophen (TYLENOL) tablet 650 mg  650 mg Oral Q6H PRN Prescilla Sours, PA-C   650 mg at 02/24/21 0824   alum & mag hydroxide-simeth (MAALOX/MYLANTA) 200-200-20 MG/5ML suspension 30 mL  30 mL Oral Q4H PRN Margorie John W, PA-C       benztropine (COGENTIN) tablet 0.5 mg  0.5 mg Oral Q6H PRN Arthor Captain, MD       diphenhydrAMINE (BENADRYL) injection 50 mg  50 mg Intramuscular Q6H PRN Arthor Captain, MD       feeding supplement (BOOST /  RESOURCE BREEZE) liquid 1 Container  1 Container Oral Q24H Harlow Asa, MD   1 Container at 02/25/21 1018   feeding supplement (ENSURE ENLIVE / ENSURE PLUS) liquid 237 mL  237 mL Oral Q24H Nelda Marseille, Amy E, MD   237 mL at 02/24/21 2146   gabapentin (NEURONTIN) capsule 300 mg  300 mg Oral TID Arthor Captain, MD       hydrOXYzine (ATARAX/VISTARIL) tablet 25 mg  25 mg Oral TID PRN Prescilla Sours, PA-C   25 mg at 02/24/21 1805   loperamide (IMODIUM) capsule 2-4 mg  2-4 mg Oral PRN Arthor Captain, MD       LORazepam (ATIVAN) tablet 1 mg  1  mg Oral Q6H PRN Arthor Captain, MD       risperiDONE (RISPERDAL M-TABS) disintegrating tablet 1 mg  1 mg Oral Q8H PRN Arthor Captain, MD       And   LORazepam (ATIVAN) tablet 1 mg  1 mg Oral PRN Arthor Captain, MD       And   ziprasidone (GEODON) injection 20 mg  20 mg Intramuscular PRN Arthor Captain, MD       magnesium hydroxide (MILK OF MAGNESIA) suspension 30 mL  30 mL Oral Daily PRN Margorie John W, PA-C       multivitamin with minerals tablet 1 tablet  1 tablet Oral Daily Arthor Captain, MD   1 tablet at 02/25/21 7026   nicotine polacrilex (NICORETTE) gum 2 mg  2 mg Oral PRN Prescilla Sours, PA-C   2 mg at 02/24/21 1805   ondansetron (ZOFRAN-ODT) disintegrating tablet 4 mg  4 mg Oral Q6H PRN Arthor Captain, MD       risperiDONE (RISPERDAL M-TABS) disintegrating tablet 1 mg  1 mg Oral Daily Arthor Captain, MD   1 mg at 02/25/21 3785   risperiDONE (RISPERDAL M-TABS) disintegrating tablet 2 mg  2 mg Oral QHS Arthor Captain, MD   2 mg at 02/24/21 2138   thiamine tablet 100 mg  100 mg Oral Daily Arthor Captain, MD   100 mg at 02/25/21 8850   traZODone (DESYREL) tablet 25 mg  25 mg Oral QHS PRN,MR X 1 Arthor Captain, MD        Lab Results:  Results for orders placed or performed during the hospital encounter of 02/23/21 (from the past 48 hour(s))  Sedimentation rate     Status: None   Collection Time: 02/24/21  6:38 PM  Result Value Ref Range    Sed Rate 1 0 - 16 mm/hr    Comment: Performed at [facility], [place] [address], [place], [place] [address]  Ammonia     Status: None   Collection Time: 02/25/21  6:29 AM  Result Value Ref Range   Ammonia 30 9 - 35 umol/L    Comment: Performed at [facility], [place] [address] [place], [place] [address]    Blood Alcohol level:  Lab Results  Component Value Date   ETH <10 02/22/2021   ETH <10 76/72/0947    Metabolic Disorder Labs: Lab Results  Component Value Date   HGBA1C 4.9 02/22/2021   MPG 93.93 02/22/2021   No results found for: PROLACTIN Lab Results  Component Value Date   CHOL 166 02/22/2021   TRIG 42 02/22/2021   HDL 93 02/22/2021   CHOLHDL 1.8 02/22/2021   VLDL 8 02/22/2021   LDLCALC 65 02/22/2021    Physical Findings: AIMS:  , ,  ,  ,    CIWA:  CIWA-Ar Total: 2 COWS:     Musculoskeletal: Strength & Muscle Tone:  Atrophy, weakness/paralysis of right upper extremity; otherwise WNL Gait & Station: normal Patient leans: N/A  Psychiatric Specialty Exam:  Presentation  General Appearance: Fairly Groomed  Eye Contact:Good  Speech:Clear and Coherent; Normal Rate  Speech Volume:Normal  Handedness:Left   Mood and Affect  Mood:Anxious; Depressed  Affect:Congruent   Thought Process  Thought Processes:Coherent  Descriptions of Associations:Tangential  Orientation:Full (Time, Place and Person)  Thought Content:Paranoid Ideation; Delusions  History of Schizophrenia/Schizoaffective disorder:No  Duration of Psychotic Symptoms:Greater than six months  Hallucinations:Hallucinations: Other (comment); None (denies hallucinations)  Ideas of Reference:Paranoia  Suicidal Thoughts:Suicidal Thoughts: No  Homicidal Thoughts:Homicidal Thoughts: No   Sensorium  Memory:Immediate Good; Recent Fair; Remote Fair  Judgment:Impaired  Insight:Shallow   Executive Functions  Concentration:Fair  Attention  Span:Fair  [place]  Language:Good   Psychomotor Activity  Psychomotor Activity:Psychomotor Activity: Normal   Assets  Assets:Communication Skills; Desire for Improvement; Housing   Sleep  Sleep:Sleep: Good Number of Hours of Sleep: 6.25    Physical Exam: Physical Exam Vitals and nursing note reviewed.  Constitutional:      General: He is not in acute distress.    Appearance: Normal appearance. He is not diaphoretic.  HENT:     Head: Normocephalic and atraumatic.  Cardiovascular:     Rate and Rhythm: Normal rate.  Pulmonary:     Effort: Pulmonary effort is normal.  Neurological:     General: No focal deficit present.     Mental Status: He is alert and oriented to person, place, and time.     Motor: Weakness and atrophy present. No tremor.     Comments: Right upper extremity weakness, atrophy secondary to nerve root injury in 2010   Review of Systems  Constitutional:  Negative for chills, diaphoresis and fever.  HENT:  Negative for sore throat.   Respiratory:  Negative for cough and shortness of breath.   Cardiovascular:  Negative for chest pain and palpitations.  Gastrointestinal:  Negative for constipation, diarrhea, nausea and vomiting.  Genitourinary:  Negative for dysuria.  Musculoskeletal:  Negative for myalgias.       Positive for chronic right upper extremity pain, weakness and atrophy secondary to nerve root injury in 2010  Neurological:  Positive for focal weakness. Negative for dizziness, tremors and headaches.       Positive for chronic right upper extremity pain, weakness and atrophy secondary to nerve root injury in 2010  Psychiatric/Behavioral:  Positive for depression and substance abuse. Negative for hallucinations and suicidal ideas. The patient is nervous/anxious. The patient does not have insomnia.   Blood pressure 97/76, pulse 93, temperature (!) 97.3 F (36.3 C), resp. rate 16, height $RemoveBe'5\' 11"'ClrSZSEex$  (1.803 m), weight 64.6  kg, SpO2 100 %. Body mass index is 19.87 kg/m.   Treatment Plan Summary: The patient is a 30 year old male with prior diagnoses of ADHD, anxiety, MDD, substance abuse and history of paranoia who was admitted with worsening psychotic symptoms and suicidal ideation in the context of substance use (THC, tramadol, Klonopin).  Differential diagnosis includes substance-induced psychotic disorder versus primary psychotic disorder versus mood disorder with psychotic features.  Patient is tolerating initiation and upward titration of risperidone.  Patient requires further inpatient psychiatric hospitalization for treatment and stabilization of psychotic symptoms.  Daily contact with patient to assess and evaluate symptoms and progress in treatment and Medication management  Psychosis, unspecified type -Increase Risperdal to 2 mg QAM and 2 mg QHS  -Continue benztropine 0.5 mg PO Q6H PRN tremors, muscle stiffness -Continue diphenhydramine 50 mg IM Q6H PRN acute dystonic reaction -CT of the head has been ordered for today 02/25/2021 as part of work-up for new onset psychosis  Mood -Defer initiation of antidepressant for now.  Patient declines current treatment with antidepressant and mood symptoms may improve once psychotic symptoms improve.  Insomnia -Continue trazodone 50 mg at bedtime PRN  Anxiety -Continue hydroxyzine 25 mg 3 times daily PRN  Polysubstance use disorder (THC, Klonopin, tramadol) -Continue CIWA protocol with comfort meds and PRN lorazepam for CIWA >10 to cover for  any possible signs or symptoms of benzodiazepine withdrawal.  Patient is not currently displaying signs or symptoms of benzodiazepine withdrawal. -Have deferred initiation of COWS protocol for now.  Patient reported taking tramadol that was not prescribed for him prior to admission but was vague about amounts, frequency, etc.  Patient is not currently displaying any signs or symptoms of opiate withdrawal. -Patient would  benefit from participation in substance abuse treatment program after discharge  Reactive HCV antibody test -HCV RNA diagnoses, NAA lab test was collected with a.m. 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 draw on 02/25/2021: Ammonia level, ANA, sed rate.  Discharge planning in progress.       Arthor Captain, MD 02/25/2021, 12:57 PM

## 2021-02-25 NOTE — [facility] Suicide Risk Assessment (Addendum)
[facility] INPATIENT:  Family/Significant Other Suicide Prevention Education  Suicide Prevention Education:  Contact Attempts: father, [name] [phone]), has been identified by the patient as the family member/significant other with whom the patient will be residing, and identified as the person(s) who will aid the patient in the event of a mental health crisis.  With written consent from the patient, two attempts were made to provide suicide prevention education, prior to and/or following the patient's discharge.  We were unsuccessful in providing suicide prevention education.  A suicide education pamphlet was given to the patient to share with family/significant other.  Date and time of first attempt: 02/25/2021 / 9:45am CSW unable to reach pt's father with phone number provided. Date and time of second attempt: 03/01/2021 / 9:36am CSW unable to reach pt's father.   Jeffery Baldwin Jeffery Baldwin 02/25/2021, 9:46 AM

## 2021-02-25 NOTE — Group Note (Signed)
LCSW Group Therapy Note   Group Date: 02/25/2021 Start Time: 1300 End Time: 1400    Type of Therapy and Topic:  Group Therapy:  Self-Care Wheel  Participation Level:  Active   Description of Group This process group involved patients discussing the importance of self-care in different areas of life (professional, personal, emotional, psychological, spiritual, and physical) in order to achieve healthy life balance.  The group talked about what self-care in each of those areas would constitute and then specifically listed how they want to provide themselves with improved self-care in this new year.      Therapeutic Goals Patient will learn how to break self-care down into various areas of life Patient will participate in generating ideas about healthy self-care options in each category Patients will be supportive of one another and receive support from others Patient will identify one healthy self-care activity to add to his/her life this year  Summary of Patient Progress:  Pt attended and participated in group. Pt was observed interacting with peers.  Therapeutic Modalities Processing Psychoeducation  Jeffery Baldwin A Tanyika Barros, LCSW 02/25/2021  1:20 PM   

## 2021-02-25 NOTE — Progress Notes (Signed)
Pt visible on the unit , stated he had a goof day. Pt given PRN Tylenol for R-arm pain 6/10 and Trazodone , nicorette gum per [facility]    02/25/21 2000  Psych Admission Type (Psych Patients Only)  Admission Status Voluntary  Psychosocial Assessment  Patient Complaints Anxiety  Eye Contact Fair  Facial Expression Flat  Affect Appropriate to circumstance  Speech Logical/coherent  Interaction Assertive  Motor Activity Slow ('I'm tired /fatigued)  Appearance/Hygiene Unremarkable  Behavior Characteristics Cooperative  Mood Anxious  Aggressive Behavior  Effect No apparent injury  Thought Process  Coherency WDL  Content WDL  Delusions None reported or observed  Perception WDL  Hallucination None reported or observed  Judgment Impaired  Confusion None  Danger to Self  Current suicidal ideation? Denies  Self-Injurious Behavior No self-injurious ideation or behavior indicators observed or expressed   Agreement Not to Harm Self Yes  Description of Agreement Verbal contract  Danger to Others  Danger to Others None reported or observed

## 2021-02-26 DIAGNOSIS — F411 Generalized anxiety disorder: Secondary | ICD-10-CM | POA: Diagnosis not present

## 2021-02-26 DIAGNOSIS — F29 Unspecified psychosis not due to a substance or known physiological condition: Secondary | ICD-10-CM | POA: Diagnosis not present

## 2021-02-26 DIAGNOSIS — F22 Delusional disorders: Secondary | ICD-10-CM

## 2021-02-26 DIAGNOSIS — F1990 Other psychoactive substance use, unspecified, uncomplicated: Secondary | ICD-10-CM

## 2021-02-26 DIAGNOSIS — F330 Major depressive disorder, recurrent, mild: Secondary | ICD-10-CM

## 2021-02-26 LAB — ANA: Anti Nuclear Antibody (ANA): NEGATIVE

## 2021-02-26 LAB — IRON AND TIBC
Iron: 100 ug/dL (ref 45–182)
Saturation Ratios: 27 % (ref 17.9–39.5)
TIBC: 377 ug/dL (ref 250–450)
UIBC: 277 ug/dL

## 2021-02-26 LAB — FERRITIN: Ferritin: 208 ng/mL (ref 24–336)

## 2021-02-26 LAB — VITAMIN D 25 HYDROXY (VIT D DEFICIENCY, FRACTURES): Vit D, 25-Hydroxy: 33.49 ng/mL (ref 30–100)

## 2021-02-26 MED ORDER — RISPERIDONE 2 MG PO TBDP
4.0000 mg | ORAL_TABLET | Freq: Every day | ORAL | Status: DC
  Administered 2021-02-26 – 2021-02-28 (×3): 4 mg via ORAL
  Filled 2021-02-26 (×4): qty 2

## 2021-02-26 MED ORDER — FLUTICASONE PROPIONATE 50 MCG/ACT NA SUSP
2.0000 | Freq: Two times a day (BID) | NASAL | Status: DC | PRN
  Administered 2021-02-26 – 2021-03-01 (×5): 2 via NASAL
  Filled 2021-02-26: qty 16

## 2021-02-26 NOTE — Progress Notes (Signed)
Pt was given mindfulness skills packet to go over, review and ask questions for psycho-ed group. 

## 2021-02-26 NOTE — Group Note (Signed)
LCSW Group Therapy Note   Group Date: 02/26/2021 Start Time: 1015am End Time: 1115am   Type of Therapy and Topic:  Group Therapy:   Participation Level:  Active  Description of Group:  In group today, the group was introduced to the concept of "Unhelpful Thinking Styles."  A few specific types of cognitive distortions were described and discussed including Mental Filter, Jumping to Conclusions, Personalization, Catastrophizing, Black and White Thinking, Shoulding and Musting, Labeling, Overgeneralizing, and Disqualifying/Ignoring the Positives.  Examples were given of each and the group was asked to provide personal examples.  Patients were then asked to think about which of these types of thinking they tend to utilize and how that might affect their interactions with people in their lives.  Therapeutic Goals:  1.  Learn about cognitive distortions. 2.   Allow patients the opportunity to reflect on patterns of unhelpful thinking they may engage in. 3.  Discuss the types of unhelpful thinking that individual patients tend to use. 4.  Encourage patients to continue to consider how their cognitive distortions may influence their relationships and what steps they may wish to take to make changes in their life for better outcomes.   Summary of Patient Progress:    The patient was interactive and engaged throughout the entirety of the group.  He encouraged other patients when they spoke about their thoughts and/or experiences.  He shared that overgeneralizing and predicting the outcome are two of the unhelpful thinking styles he tends to use.  He did not share a great deal of personal information, but spoke each time he was called on directly.  When he did volunteer information, it was generally in support of others.  Therapeutic Modalities:  Psychoeducation Processing  Lynnell Chad, Theresia Majors 02/26/2021

## 2021-02-26 NOTE — Progress Notes (Signed)
[facility] MD Progress Note  02/26/2021 11:02 AM Jeffery Baldwin  MRN:  [id number]  Reason for admission:  Jeffery Baldwin is a 30 year old male with history of prior treatment for ADHD, MDD, GAD, paranoia and history of substance abuse (THC, Klonopin, tramadol, Roxicodone) including remote history of heroin use disorder who presented to [facility] saying he needed to be committed due to feeling he is being "gaslighted, tortured and trust broken" and presenting with disorganized thought processes, paranoid delusions, dj vu experiences, trouble sleeping, hallucinations and suicidal ideation.  UDS was positive for THC.  Objective: Medical record reviewed.  Patient's case discussed in detail with members of the treatment team and nursing staff.  I met with and evaluated the patient on the unit today for follow-up.  Patient is friendly upon approach and eager to talk.  Reviewed his recent lab work to include ammonia levels and sed rate within normal limits, and ANA and HCV are still pending.  Patient was told that his head CT was normal, to which she states, "I am surprised.  I thought I probably have a brain tumor."  Patient states that he feels he is playing a role in a movie.  He states that he has premonitions of things that will happen shortly after he thinks them.  He also states that he has frequent dj vu events of things happening that someone had previously told him in a story, "like Edison Nasuti wrestling with God".  He attempts to clarify that he is not wrestling with God and goes off with tangential speech with hyper religious undertones.  Patient states that he [place] been living [place] family, but relates "there has been so much trauma".  He reports that he lived with his mother until she died, and then with his [place] until last year.  He cannot state where he has been staying the last year but then says with family.  He believes that he had agreed and signed over guardianship to someone, but is uncertain.   He denies auditory and visual hallucinations today, but states he continues to feel paranoid.  He believes that he is getting messages from the television, noting "my subconscious is being probed."  Patient states that he does not feel like he wants to be on a lot of medication.  He states that he wants to stop the trazodone and the Vistaril.  He is reluctant to take Risperdal and asks about other medications.  He does state that he once tried Seroquel to get high but it instead "knocked me out for 2 days.  If I took Seroquel, I would have to take a low dose like 25 mg.  Patient endorses a history of a suicide attempt by trying to hang himself 1 year ago.  He states that someone in his family found him.  He denies that he came to the hospital after that. Patient continues to have disorganized thoughts. He is sleeping and eating okay. He denies SI, AI, HI, AH or VH.  He has been medication compliant, and denies side effects.  He is not experiencing any withdrawal symptoms from benzodiazepines or opiates.  CIWA scores over the last 24 hours have been between 0 and 3 with patient scoring only for anxiety. He continues to have chronic right upper extremity pain, but states gabapentin is helpful  Patient is taking scheduled medications as prescribed.  He took PRN hydroxyzine 25 mg yesterday evening for anxiety and PRN trazodone 50 mg last night for sleep,  but wishes to have these discontinued.  Staff document that patient appeared suspicious and paranoid on the unit.  Staff document patient slept Number of Hours: 6 last night.  Principal Problem: Psychosis, 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[place]) Diagnosis: Principal Problem:   Psychosis, 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[place]) Active Problems:   Generalized anxiety disorder   Mild episode of recurrent major depressive disorder ([place])   Paranoia ([place])   Substance use disorder   Total Time Spent in Direct Patient Care:  I personally spent 35 minutes on the unit in direct  patient care. The direct patient care time included face-to-face time with the patient, reviewing the patient's chart, communicating with other professionals, and coordinating care. Greater than 50% of this time was spent in counseling or coordinating care with the patient regarding goals of hospitalization, psycho-education, and discharge planning needs.   Past Psychiatric History: See admission H&P  Past Medical History:  Past Medical History:  Diagnosis Date   Chronic pain syndrome    Heroin abuse ([place])    Nerve root avulsion    Spinal cord injury at C5-C7 level without injury of spinal bone ([place]) 2011    Past Surgical History:  Procedure Laterality Date   nerve root repair     Family History: History reviewed. No pertinent family history. Family Psychiatric  History: See admission H&P Social History:  Social History   Substance and Sexual Activity  Alcohol Use Yes   Alcohol/week: 0.0 standard drinks   Comment: occasional     Social History   Substance and Sexual Activity  Drug Use Yes   Comment: heroine    Social History   Socioeconomic History   Marital status: Single    Spouse name: Not on file   Number of children: Not on file   Years of education: Not on file   Highest education level: Not on file  Occupational History   Not on file  Tobacco Use   Smoking status: Some Days    Packs/day: 0.20    Years: 7.00    Pack years: 1.40    Types: Cigarettes   Smokeless tobacco: Never   Tobacco comments:    decreased smoking  Substance and Sexual Activity   Alcohol use: Yes    Alcohol/week: 0.0 standard drinks    Comment: occasional   Drug use: Yes    Comment: heroine   Sexual activity: Yes  Other Topics Concern   Not on file  Social History Narrative   ** Merged History Encounter **       Social Determinants of Health   Financial Resource Strain: Not on file  Food Insecurity: Not on file  Transportation Needs: Not on file  Physical Activity: Not on  file  Stress: Not on file  Social Connections: Not on file   Additional Social History:                         Sleep: Good  Appetite:  Fair  Current Medications: Current Facility-Administered Medications  Medication Dose Route Frequency Provider Last Rate Last Admin   acetaminophen (TYLENOL) tablet 650 mg  650 mg Oral Q6H PRN Prescilla Sours, PA-C   650 mg at 02/25/21 2112   alum & mag hydroxide-simeth (MAALOX/MYLANTA) 200-200-20 MG/5ML suspension 30 mL  30 mL Oral Q4H PRN Margorie John W, PA-C       benztropine (COGENTIN) tablet 0.5 mg  0.5 mg Oral Q6H PRN Arthor Captain, MD  diphenhydrAMINE (BENADRYL) injection 50 mg  50 mg Intramuscular Q6H PRN Arthor Captain, MD       feeding supplement (BOOST / RESOURCE BREEZE) liquid 1 Container  1 Container Oral Q24H Harlow Asa, MD   1 Container at 02/26/21 9675   feeding supplement (ENSURE ENLIVE / ENSURE PLUS) liquid 237 mL  237 mL Oral Q24H Nelda Marseille, Amy E, MD   237 mL at 02/25/21 2122   gabapentin (NEURONTIN) capsule 300 mg  300 mg Oral TID Arthor Captain, MD   300 mg at 02/26/21 9163   hydrOXYzine (ATARAX/VISTARIL) tablet 25 mg  25 mg Oral TID PRN Prescilla Sours, PA-C   25 mg at 02/24/21 1805   risperiDONE (RISPERDAL M-TABS) disintegrating tablet 1 mg  1 mg Oral Q8H PRN Arthor Captain, MD       And   LORazepam (ATIVAN) tablet 1 mg  1 mg Oral PRN Arthor Captain, MD       And   ziprasidone (GEODON) injection 20 mg  20 mg Intramuscular PRN Arthor Captain, MD       magnesium hydroxide (MILK OF MAGNESIA) suspension 30 mL  30 mL Oral Daily PRN Margorie John W, PA-C       multivitamin with minerals tablet 1 tablet  1 tablet Oral Daily Arthor Captain, MD   1 tablet at 02/26/21 8466   nicotine polacrilex (NICORETTE) gum 2 mg  2 mg Oral PRN Prescilla Sours, PA-C   2 mg at 02/26/21 5993   risperiDONE (RISPERDAL M-TABS) disintegrating tablet 2 mg  2 mg Oral QHS Arthor Captain, MD   2 mg at 02/25/21 2112   risperiDONE  (RISPERDAL M-TABS) disintegrating tablet 2 mg  2 mg Oral Daily Arthor Captain, MD   2 mg at 02/26/21 5701   thiamine tablet 100 mg  100 mg Oral Daily Arthor Captain, MD   100 mg at 02/26/21 7793   traZODone (DESYREL) tablet 25 mg  25 mg Oral QHS PRN,MR X 1 Arthor Captain, MD   25 mg at 02/25/21 2112    Lab Results:  Results for orders placed or performed during the hospital encounter of 02/23/21 (from the past 48 hour(s))  Sedimentation rate     Status: None   Collection Time: 02/24/21  6:38 PM  Result Value Ref Range   Sed Rate 1 0 - 16 mm/hr    Comment: Performed at [facility], [place] [address] [place], [place] [address]  Ammonia     Status: None   Collection Time: 02/25/21  6:29 AM  Result Value Ref Range   Ammonia 30 9 - 35 umol/L    Comment: Performed at [facility], [place] [address] [place], [place] [address]    Blood Alcohol level:  Lab Results  Component Value Date   ETH <10 02/22/2021   ETH <10 07/62/2633    Metabolic Disorder Labs: Lab Results  Component Value Date   HGBA1C 4.9 02/22/2021   MPG 93.93 02/22/2021   No results found for: PROLACTIN Lab Results  Component Value Date   CHOL 166 02/22/2021   TRIG 42 02/22/2021   HDL 93 02/22/2021   CHOLHDL 1.8 02/22/2021   VLDL 8 02/22/2021   LDLCALC 65 02/22/2021    Physical Findings: AIMS:  , ,  ,  ,    CIWA:  CIWA-Ar Total: 0 COWS:     Musculoskeletal: Strength & Muscle Tone:  Atrophy, weakness/paralysis  of right upper extremity; otherwise WNL Gait & Station: normal Patient leans: N/A  Psychiatric Specialty Exam:  Presentation  General Appearance: Fairly Groomed  Eye Contact:Good  Speech:Clear and Coherent; Normal Rate  Speech Volume:Normal  Handedness:Left   Mood and Affect  Mood:Anxious; Depressed  Affect:Congruent   Thought Process  Thought Processes:Coherent  Descriptions of Associations:Tangential  Orientation:Full (Time, Place and  Person)  Thought Content:Paranoid Ideation; Delusions  History of Schizophrenia/Schizoaffective disorder:No  Duration of Psychotic Symptoms:Greater than six months  Hallucinations:Hallucinations: Other (comment); None (denies hallucinations)  Ideas of Reference:Paranoia  Suicidal Thoughts:Suicidal Thoughts: No  Homicidal Thoughts:Homicidal Thoughts: No   Sensorium  Memory:Immediate Good; Recent Fair; Remote Fair  Judgment:Impaired  Insight:Shallow   Executive Functions  Concentration:Fair  Attention Span:Fair  [place]  Language:Good   Psychomotor Activity  Psychomotor Activity:Psychomotor Activity: Normal   Assets  Assets:Communication Skills; Desire for Improvement; Housing   Sleep  Sleep:Sleep: Good Number of Hours of Sleep: 6.25    Physical Exam: Physical Exam Vitals and nursing note reviewed.  Constitutional:      General: He is not in acute distress.    Appearance: Normal appearance. He is not diaphoretic.  HENT:     Head: Normocephalic and atraumatic.  Cardiovascular:     Rate and Rhythm: Normal rate.  Pulmonary:     Effort: Pulmonary effort is normal.  Neurological:     General: No focal deficit present.     Mental Status: He is alert and oriented to person, place, and time.     Motor: Weakness and atrophy present. No tremor.     Comments: Right upper extremity weakness, atrophy secondary to nerve root injury in 2010   Review of Systems  Constitutional:  Negative for chills, diaphoresis and fever.  HENT:  Negative for sore throat.   Respiratory:  Negative for cough and shortness of breath.   Cardiovascular:  Negative for chest pain and palpitations.  Gastrointestinal:  Negative for constipation, diarrhea, nausea and vomiting.  Genitourinary:  Negative for dysuria.  Musculoskeletal:  Negative for myalgias.       Positive for chronic right upper extremity pain, weakness and atrophy secondary to nerve root  injury in 2010  Neurological:  Positive for focal weakness. Negative for dizziness, tremors and headaches.       Positive for chronic right upper extremity pain, weakness and atrophy secondary to nerve root injury in 2010  Psychiatric/Behavioral:  Positive for depression and substance abuse. Negative for hallucinations and suicidal ideas. The patient is nervous/anxious. The patient does not have insomnia.   Blood pressure 113/70, pulse 80, temperature 97.7 F (36.5 C), temperature source Oral, resp. rate 16, height _0  (1.803 m), weight 64.6 kg, SpO2 97 %. Body mass index is 19.87 kg/m.   Treatment Plan Summary: The patient is a 30 year old male with prior diagnoses of ADHD, anxiety, MDD, substance abuse and history of paranoia who was admitted with worsening psychotic symptoms and suicidal ideation in the context of substance use (THC, tramadol, Klonopin).  Differential diagnosis includes substance-induced psychotic disorder versus primary psychotic disorder versus mood disorder with psychotic features.  Patient is tolerating initiation and upward titration of risperidone.  Patient requires further inpatient psychiatric hospitalization for treatment and stabilization of psychotic symptoms.  Daily contact with patient to assess and evaluate symptoms and progress in treatment and Medication management  Psychosis, unspecified type -Increase Risperdal to 2 mg QAM and 4 mg QHS  -Continue benztropine 0.5 mg PO Q6H PRN tremors, muscle stiffness -  Continue diphenhydramine 50 mg IM Q6H PRN acute dystonic reaction -CT of the head has been ordered for today 02/25/2021 as part of work-up for new onset psychosis  Mood -Defer initiation of antidepressant for now.  Patient declines current treatment with antidepressant and mood symptoms may improve once psychotic symptoms improve.  Insomnia -Patient requests to discontinue trazodone 50 mg at bedtime PRN  Anxiety -Patient requests to discontinue  hydroxyzine 25 mg 3 times daily PRN  Polysubstance use disorder (THC, Klonopin, tramadol) -Continue CIWA protocol with comfort meds and PRN lorazepam for CIWA >10 to cover for any possible signs or symptoms of benzodiazepine withdrawal.  Patient is not currently displaying signs or symptoms of benzodiazepine withdrawal. -Have deferred initiation of COWS protocol for now.  Patient reported taking tramadol that was not prescribed for him prior to admission but was vague about amounts, frequency, etc.  Patient is not currently displaying any signs or symptoms of opiate withdrawal. -Patient would benefit from participation in substance abuse treatment program after discharge  Reactive HCV antibody test -HCV RNA diagnoses, NAA lab test was collected with a.m. 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 draw on 02/25/2021: Ammonia level, ANA, sed rate. Added a ferritin level, iron and TIBC as well as vitamin D levels to existing lab work.  This is not drawn, we will outline tomorrow.  Discharge planning in progress.  Appreciate social work assistance in discharge planning.     Lavella Hammock, MD 02/26/2021, 11:02 AM

## 2021-02-26 NOTE — Progress Notes (Signed)
Pt did not attend orientation/goals group. 

## 2021-02-26 NOTE — Progress Notes (Signed)
Adult Psychoeducational Group Note  Date:  02/26/2021 Time:  12:59 AM  Group Topic/Focus:  Wrap-Up Group:   The focus of this group is to help patients review their daily goal of treatment and discuss progress on daily workbooks.  Participation Level:  Active  Participation Quality:  Appropriate  Affect:  Appropriate  Cognitive:  Appropriate  Insight: Appropriate  Engagement in Group:  Developing/Improving  Modes of Intervention:  Discussion  Additional Comments:  Pt stated his goal for today was to focus on his treatment plan. Pt stated he accomplished his goal today. Pt stated he talked with his doctor but did not get a chance to talk with his social worker about his care today. Pt rated his overall day a 8 out of 10. Pt stated he made no calls today. Pt stated he felt better about himself today. Pt stated he was able to attend all meals. Pt stated he took all medications provided today. Pt stated he attend all groups held today. Pt stated his appetite was pretty good today. Pt rated sleep last night was pretty good. Pt stated the goal tonight was to get some rest. Pt stated he had some physical pain tonight. Pt stated he had some moderate pain in his right arm tonight. Pt rated the moderate pain in his right arm a 6 on the pain level scale. Pt nurse was updated on the situation. Pt deny visual hallucinations and auditory issues tonight. Pt denies thoughts of harming others. Pt admitted to having thoughts of harming himself today. Pt stated he could contract for safety. Pt nurse was updated on situation. Pt stated he would alert staff if anything changed.  Felipa Furnace 02/26/2021, 12:59 AM

## 2021-02-26 NOTE — Progress Notes (Signed)
Adult Psychoeducational Group Note  Date:  02/26/2021 Time:  10:44 PM  Group Topic/Focus:  Wrap-Up Group:   The focus of this group is to help patients review their daily goal of treatment and discuss progress on daily workbooks.  Participation Level:  Active  Participation Quality:  Appropriate  Affect:  Appropriate  Cognitive:  Appropriate  Insight: Appropriate  Engagement in Group:  Limited  Modes of Intervention:  Discussion  Additional Comments:  Pt stated his goal for today was to focus on his treatment plan. Pt stated he accomplished his goal today. Pt stated he talked with his doctor but did not get a chance to talk with his social worker about his care today. Pt rated his overall day a 7 out of 10. Pt stated he made no calls today. Pt stated he felt better about himself today. Pt stated he was able to attend all meals. Pt stated he took all medications provided today. Pt stated he attend all groups held today. Pt stated his appetite was pretty good today. Pt rated sleep last night was pretty good. Pt stated the goal tonight was to get some rest. Pt stated he had some physical pain tonight. Pt stated he had some moderate pain in his right arm tonight. Pt rated the moderate pain in his right arm a 5 on the pain level scale. Pt nurse was updated on the situation. Pt deny visual hallucinations and auditory issues tonight. Pt denies thoughts of harming himself or others.   Pt stated he would alert staff if anything changed.  Felipa Furnace 02/26/2021, 10:44 PM

## 2021-02-27 DIAGNOSIS — F411 Generalized anxiety disorder: Secondary | ICD-10-CM | POA: Diagnosis not present

## 2021-02-27 DIAGNOSIS — F22 Delusional disorders: Secondary | ICD-10-CM | POA: Diagnosis not present

## 2021-02-27 DIAGNOSIS — F29 Unspecified psychosis not due to a substance or known physiological condition: Secondary | ICD-10-CM | POA: Diagnosis not present

## 2021-02-27 DIAGNOSIS — F330 Major depressive disorder, recurrent, mild: Secondary | ICD-10-CM | POA: Diagnosis not present

## 2021-02-27 LAB — HCV RNA DIAGNOSIS, NAA
HCV RNA, Quantitation: 332000 IU/mL
HCV RNA, log10: 5.521 log10 IU/mL

## 2021-02-27 NOTE — Progress Notes (Signed)
   02/26/21 2130  Psychosocial Assessment  Eye Contact Fair  Facial Expression Flat  Affect Appropriate to circumstance  Speech Logical/coherent  Interaction Assertive  Motor Activity Slow  Appearance/Hygiene Unremarkable  Aggressive Behavior  Effect No apparent injury  Thought Process  Coherency WDL  Content WDL  Delusions None reported or observed  Perception WDL  Hallucination None reported or observed  Judgment Impaired  Confusion None  Danger to Self  Current suicidal ideation? Denies  Self-Injurious Behavior No self-injurious ideation or behavior indicators observed or expressed   Agreement Not to Harm Self Yes  Description of Agreement Verbal contract  Danger to Others  Danger to Others None reported or observed    02/26/21 2130  Psychosocial Assessment  Eye Contact Fair  Facial Expression Flat  Affect Appropriate to circumstance  Speech Logical/coherent  Interaction Assertive  Motor Activity Slow  Appearance/Hygiene Unremarkable  Aggressive Behavior  Effect No apparent injury  Thought Process  Coherency WDL  Content WDL  Delusions None reported or observed  Perception WDL  Hallucination None reported or observed  Judgment Impaired  Confusion None  Danger to Self  Current suicidal ideation? Denies  Self-Injurious Behavior No self-injurious ideation or behavior indicators observed or expressed   Agreement Not to Harm Self Yes  Description of Agreement Verbal contract  Danger to Others  Danger to Others None reported or observed

## 2021-02-27 NOTE — Progress Notes (Signed)
[facility] MD Progress Note  02/27/2021 5:36 PM DORSE LOCY  MRN:  [id number]  Reason for admission:  Jeffery Baldwin is a 30 year old male with history of prior treatment for ADHD, MDD, GAD, paranoia and history of substance abuse (THC, Klonopin, tramadol, Roxicodone) including remote history of heroin use disorder who presented to [facility] saying he needed to be committed due to feeling he is being "gaslighted, tortured and trust broken" and presenting with disorganized thought processes, paranoid delusions, dj vu experiences, trouble sleeping, hallucinations and suicidal ideation.  UDS was positive for THC.  Objective: Medical record reviewed.  Patient's case discussed in detail with members of the treatment team and nursing staff.  I met with and evaluated the patient on the unit today for follow-up.  Patient states, "this medicine is kicking my ass."  Patient states that he feels overly sedated and congested which she attributes to medication.  He otherwise states that he feels less paranoid today.  He denies any auditory or visual hallucinations.  He denies getting messages from the television.  He continues to have some dj vu and premonition thoughts, but states that these are yes than yesterday after increase of Risperdal last night.  Patient states that his thoughts are clearer, and he does appear to be more organized today.  He is able to discuss discharge planning, noting "I am not sure where I am going to live."  He acknowledges that he last worked 2 years ago in [employer] despite his right arm injury from October 2010.  He reports that he has been taking classes in cyber security through at [employer], but does feel as if "something is preventing me from learning."  He is able to reflect back to when he was younger, and wishes he had thought to go to a trade school or college at a younger age.  Patient has been attending groups and interacting with peers in the milieu.  Group therapy  leaders have noted that he is beginning to show some insight in group.  Patient reports that he is sleeping and eating okay. He denies SI, AI, HI, AH or VH.  He has been medication compliant, and denies side effects.  He is not experiencing any withdrawal symptoms from benzodiazepines or opiates.  CIWA scores over the last 24 hours have been between 0 and 3 with patient scoring only for anxiety. He continues to have chronic right upper extremity pain, but states gabapentin is helpful. Patient is taking scheduled medications as prescribed.  Hydroxyzine and trazodone have been discontinued. Staff document patient slept Number of Hours: 6.75 last night.  New labs today: Vitamin D, iron, TIBC and ferritin, sed rate, ammonia levels within normal limits.  ANA negative HCVRNA quantitation 332,000 (positive for current HCV infection)  Principal Problem: Psychosis, 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[place]) Diagnosis: Principal Problem:   Psychosis, 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[place]) Active Problems:   Generalized anxiety disorder   Mild episode of recurrent major depressive disorder ([place])   Paranoia ([place])   Substance use disorder   Total Time Spent in Direct Patient Care:  I personally spent 35 minutes on the unit in direct patient care. The direct patient care time included face-to-face time with the patient, reviewing the patient's chart, communicating with other professionals, and coordinating care. Greater than 50% of this time was spent in counseling or coordinating care with the patient regarding goals of hospitalization, psycho-education, and discharge planning needs.   Past Psychiatric History: See admission H&P  Past Medical History:  Past Medical History:  Diagnosis Date   Chronic pain syndrome    Heroin abuse ([place])    Nerve root avulsion    Spinal cord injury at C5-C7 level without injury of spinal bone ([place]) 2011    Past Surgical History:  Procedure Laterality Date   nerve root repair      Family History: History reviewed. No pertinent family history. Family Psychiatric  History: See admission H&P  Social History:  Social History   Substance and Sexual Activity  Alcohol Use Yes   Alcohol/week: 0.0 standard drinks   Comment: occasional     Social History   Substance and Sexual Activity  Drug Use Yes   Comment: heroine    Social History   Socioeconomic History   Marital status: Single    Spouse name: Not on file   Number of children: Not on file   Years of education: Not on file   Highest education level: Not on file  Occupational History   Not on file  Tobacco Use   Smoking status: Some Days    Packs/day: 0.20    Years: 7.00    Pack years: 1.40    Types: Cigarettes   Smokeless tobacco: Never   Tobacco comments:    decreased smoking  Substance and Sexual Activity   Alcohol use: Yes    Alcohol/week: 0.0 standard drinks    Comment: occasional   Drug use: Yes    Comment: heroine   Sexual activity: Yes  Other Topics Concern   Not on file  Social History Narrative   ** Merged History Encounter **       Social Determinants of Health   Financial Resource Strain: Not on file  Food Insecurity: Not on file  Transportation Needs: Not on file  Physical Activity: Not on file  Stress: Not on file  Social Connections: Not on file   Additional Social History:          Questionably homeless              Sleep: Good  Appetite:  Fair  Current Medications: Current Facility-Administered Medications  Medication Dose Route Frequency Provider Last Rate Last Admin   acetaminophen (TYLENOL) tablet 650 mg  650 mg Oral Q6H PRN Prescilla Sours, PA-C   650 mg at 02/25/21 2112   alum & mag hydroxide-simeth (MAALOX/MYLANTA) 200-200-20 MG/5ML suspension 30 mL  30 mL Oral Q4H PRN Margorie John W, PA-C       benztropine (COGENTIN) tablet 0.5 mg  0.5 mg Oral Q6H PRN Arthor Captain, MD       diphenhydrAMINE (BENADRYL) injection 50 mg  50 mg Intramuscular  Q6H PRN Arthor Captain, MD       feeding supplement (BOOST / RESOURCE BREEZE) liquid 1 Container  1 Container Oral Q24H Harlow Asa, MD   1 Container at 02/27/21 1109   feeding supplement (ENSURE ENLIVE / ENSURE PLUS) liquid 237 mL  237 mL Oral Q24H Nelda Marseille, Amy E, MD   237 mL at 02/26/21 2121   fluticasone (FLONASE) 50 MCG/ACT nasal spray 2 spray  2 spray Each Nare BID PRN [place], Ene A, NP   2 spray at 02/27/21 1306   gabapentin (NEURONTIN) capsule 300 mg  300 mg Oral TID Arthor Captain, MD   300 mg at 02/27/21 1410   risperiDONE (RISPERDAL M-TABS) disintegrating tablet 1 mg  1 mg Oral Q8H PRN Arthor Captain, MD       And  LORazepam (ATIVAN) tablet 1 mg  1 mg Oral PRN Arthor Captain, MD       And   ziprasidone (GEODON) injection 20 mg  20 mg Intramuscular PRN Arthor Captain, MD       magnesium hydroxide (MILK OF MAGNESIA) suspension 30 mL  30 mL Oral Daily PRN Margorie John W, PA-C       multivitamin with minerals tablet 1 tablet  1 tablet Oral Daily Arthor Captain, MD   1 tablet at 02/27/21 3790   nicotine polacrilex (NICORETTE) gum 2 mg  2 mg Oral PRN Prescilla Sours, PA-C   2 mg at 02/27/21 1306   risperiDONE (RISPERDAL M-TABS) disintegrating tablet 4 mg  4 mg Oral QHS Lavella Hammock, MD   4 mg at 02/26/21 2118   thiamine tablet 100 mg  100 mg Oral Daily Arthor Captain, MD   100 mg at 02/27/21 2409    Lab Results:  Results for orders placed or performed during the hospital encounter of 02/23/21 (from the past 48 hour(s))  Ferritin     Status: None   Collection Time: 02/26/21  6:28 PM  Result Value Ref Range   Ferritin 208 24 - 336 ng/mL    Comment: Performed at [facility], [place] [address] [place], [place] [address]  Iron and TIBC     Status: None   Collection Time: 02/26/21  6:28 PM  Result Value Ref Range   Iron 100 45 - 182 ug/dL   TIBC 377 250 - 450 ug/dL   Saturation Ratios 27 17.9 - 39.5 %   UIBC 277 ug/dL    Comment: Performed at [facility], [place] [address], [place], [place] [address]  VITAMIN D 25 Hydroxy (Vit-D Deficiency, Fractures)     Status: None   Collection Time: 02/26/21  6:28 PM  Result Value Ref Range   Vit D, 25-Hydroxy 33.49 30 - 100 ng/mL    Comment: (NOTE) Vitamin D deficiency has been defined by the [place] practice guideline as a level of serum 25-OH  vitamin D less than 20 ng/mL (1,2). The Endocrine Society went on to  further define vitamin D insufficiency as a level between 21 and 29  ng/mL (2).  1. IOM (Institute of Medicine). 2010. Dietary reference intakes for  calcium and D. [place]: The [employer]. 2. Holick MF, Binkley [place], Bischoff-Ferrari HA, et al. Evaluation,  treatment, and prevention of vitamin D deficiency: an Endocrine  Society clinical practice guideline, JCEM. 2011 Jul; 96(7): 1911-30.  Performed at [facility], [place] [address], [place], [place] [address]     Blood Alcohol level:  Lab Results  Component Value Date   [facility] Iv <10 02/22/2021   ETH <10 96/22/2979    Metabolic Disorder Labs: Lab Results  Component Value Date   HGBA1C 4.9 02/22/2021   MPG 93.93 02/22/2021   No results found for: PROLACTIN Lab Results  Component Value Date   CHOL 166 02/22/2021   TRIG 42 02/22/2021   HDL 93 02/22/2021   CHOLHDL 1.8 02/22/2021   VLDL 8 02/22/2021   LDLCALC 65 02/22/2021    Physical Findings: AIMS:  , ,  ,  ,    CIWA:  CIWA-Ar Total: 0 COWS:     Musculoskeletal: Strength & Muscle Tone:  Atrophy, weakness/paralysis of right upper extremity; otherwise WNL Gait & Station: normal Patient leans: N/A  Psychiatric Specialty  Exam:  Presentation  General Appearance: Appropriate for Environment; Fairly Groomed  Eye Contact:Good  Speech:Clear and Coherent; Normal Rate  Speech Volume:Normal  Handedness:Left   Mood and Affect  Mood:Dysphoric  Affect:Congruent   Thought Process   Thought Processes:Coherent  Descriptions of Associations:Tangential (but improving)  Orientation:Full (Time, Place and Person)  Thought Content:Paranoid Ideation; Delusions  History of Schizophrenia/Schizoaffective disorder:No  Duration of Psychotic Symptoms:Greater than six months  Hallucinations:Hallucinations: None  Ideas of Reference:Paranoia  Suicidal Thoughts:Suicidal Thoughts: No  Homicidal Thoughts:Homicidal Thoughts: No   Sensorium  Memory:Immediate Good; Recent Fair; Remote Fair  Judgment:Impaired  Insight:Shallow   Executive Functions  Concentration:Fair  Attention Span:Fair  [place]  Language:Good   Psychomotor Activity  Psychomotor Activity:Psychomotor Activity: Normal   Assets  Assets:Communication Skills; Desire for Improvement; Housing   Sleep  Sleep:Sleep: Good Number of Hours of Sleep: 6.75    Physical Exam: Physical Exam Vitals and nursing note reviewed.  Constitutional:      General: He is not in acute distress.    Appearance: Normal appearance. He is normal weight. He is not diaphoretic.  HENT:     Head: Normocephalic and atraumatic.  Cardiovascular:     Rate and Rhythm: Normal rate.  Pulmonary:     Effort: Pulmonary effort is normal.  Neurological:     General: No focal deficit present.     Mental Status: He is alert and oriented to person, place, and time.     Motor: Weakness and atrophy present. No tremor.     Comments: Right upper extremity weakness, atrophy secondary to nerve root injury in 2010   Review of Systems  Constitutional:  Negative for chills, diaphoresis and fever.  HENT:  Negative for sore throat.   Respiratory:  Negative for cough and shortness of breath.   Cardiovascular:  Negative for chest pain and palpitations.  Gastrointestinal:  Negative for constipation, diarrhea, nausea and vomiting.  Genitourinary:  Negative for dysuria.  Musculoskeletal:  Negative for  myalgias.       Positive for chronic right upper extremity pain, weakness and atrophy secondary to nerve root injury in 2010  Neurological:  Positive for focal weakness. Negative for dizziness, tremors and headaches.       Positive for chronic right upper extremity pain, weakness and atrophy secondary to nerve root injury in 2010  Psychiatric/Behavioral:  Positive for depression and substance abuse. Negative for hallucinations and suicidal ideas. The patient is nervous/anxious. The patient does not have insomnia.   Blood pressure 106/66, pulse 88, temperature 97.7 F (36.5 C), temperature source Oral, resp. rate 16, height $RemoveBe'5\' 11"'nsPHVCDOO$  (1.803 m), weight 64.6 kg, SpO2 100 %. Body mass index is 19.87 kg/m.   Treatment Plan Summary: The patient is a 30 year old male with prior diagnoses of ADHD, anxiety, MDD, substance abuse and history of paranoia who was admitted with worsening psychotic symptoms and suicidal ideation in the context of substance use (THC, tramadol, Klonopin).  Differential diagnosis includes substance-induced psychotic disorder versus primary psychotic disorder versus mood disorder with psychotic features.  Patient is tolerating initiation and upward titration of risperidone.  Patient requires further inpatient psychiatric hospitalization for treatment and stabilization of psychotic symptoms.   New labs today: Vitamin D, iron, TIBC and ferritin, sed rate, ammonia levels within normal limits.  ANA negative HCVRNA quantitation 332,000 (positive for current HCV infection)  Daily contact with patient to assess and evaluate symptoms and progress in treatment and Medication management  Psychosis, unspecified type -Discontinue Risperdal to 2  mg QAM and continue 4 mg QHS.  Patient had improved sleep with higher dose at bedtime and more organized thoughts today.  We will continue to monitor.  May need to consolidate a larger dose at night. -Continue benztropine 0.5 mg PO Q6H PRN tremors,  muscle stiffness -Continue diphenhydramine 50 mg IM Q6H PRN acute dystonic reaction -CT of the head 02/25/2021 as part of work-up for new onset psychosis was normal  Mood -Defer initiation of antidepressant for now.  Patient declines current treatment with antidepressant and mood symptoms may improve once psychotic symptoms improve.  Insomnia -Patient requests to discontinue trazodone 50 mg at bedtime PRN -Sleep improved with Risperdal 4 mg at bedtime  Anxiety -Patient requests to discontinue hydroxyzine 25 mg 3 times daily PRN  Polysubstance use disorder (THC, Klonopin, tramadol) -Continue CIWA protocol with comfort meds and PRN lorazepam for CIWA >10 to cover for any possible signs or symptoms of benzodiazepine withdrawal.  Patient is not currently displaying signs or symptoms of benzodiazepine withdrawal. -Have deferred initiation of COWS protocol for now.  Patient reported taking tramadol that was not prescribed for him prior to admission but was vague about amounts, frequency, etc.  Patient is not currently displaying any signs or symptoms of opiate withdrawal. -Patient would benefit from participation in substance abuse treatment program after discharge  Reactive HCV antibody test -HCV RNA diagnoses, NAA lab test was collected with a.m. draw 02/25/2021 and positive for active HCV infection. -Recommend GI consult and outpatient follow-up  Nerve pain of right upper extremity secondary to nerve root injury sustained 2010 -Increase gabapentin to 300 mg 3 times daily  Additional labs ordered with a.m. draw on 02/25/2021: Ammonia level, ANA, sed rate, ferritin level, iron and TIBC, vitamin D levels within normal limits.  Discharge planning in progress.  Appreciate social work assistance in discharge planning.     Lavella Hammock, MD 02/27/2021, 5:36 PM

## 2021-02-27 NOTE — [facility] Group Notes (Signed)
Pt did not attend orientation/goals group. 

## 2021-02-27 NOTE — Group Note (Signed)
[facility] LCSW Group Therapy Note  Date/Time:  02/27/2021 10:00-11:00AM  Type of Therapy and Topic:  Group Therapy:  Healthy and Unhealthy Supports   Participation Level:  MInimal  Description of Group: Patients in this group were invited to identify the differences between healthy and unhealthy supports and then to identify those people in their lives who fall into one category or the other, as well as why.  They were then introduced to the idea of adding more healthy supports and decreasing the unhealthy ones.  Patients discussed what additional healthy supports could be helpful in their recovery and wellness after discharge in order to maintain stability.   An emphasis was placed on using counselor, doctor, therapy groups, 12-step groups, and problem-specific support groups to expand supports.  At the end of group, a song "Love Myself" was played to encourage full participation in their own recovery journey.  Therapeutic Goals:   1)  discuss importance of adding supports to stay well once out of the hospital  2)  compare healthy versus unhealthy supports and identify some examples of each  3)  generate ideas and descriptions of healthy supports that can be added  4)  offer mutual support about how to address unhealthy supports  5)  encourage active participation in and adherence to discharge plan    Summary of Patient Progress:  The patient stated that current healthy supports in his life are himself while current unhealthy supports include everybody.  The patient expressed little in group and had little engagement.   Therapeutic Modalities:   Brief Solution-Focused Therapy  Ambrose Mantle, LCSW

## 2021-02-27 NOTE — Progress Notes (Signed)
   02/27/21 2000  Psychosocial Assessment  Patient Complaints Other (Comment) (pt c/o risperdal being too much)  Eye Contact Fair  Facial Expression Flat  Affect Appropriate to circumstance  Speech Logical/coherent  Interaction Assertive  Motor Activity Slow  Appearance/Hygiene Unremarkable  Behavior Characteristics Anxious  Mood Anxious;Pleasant  Aggressive Behavior  Effect No apparent injury  Thought Process  Coherency WDL  Content WDL  Delusions None reported or observed  Perception WDL  Hallucination None reported or observed  Judgment Impaired  Confusion None  Danger to Self  Current suicidal ideation? Denies  Self-Injurious Behavior No self-injurious ideation or behavior indicators observed or expressed   Agreement Not to Harm Self Yes  Description of Agreement Verbal contract  Danger to Others  Danger to Others None reported or observed

## 2021-02-27 NOTE — Progress Notes (Signed)
Child/Adolescent Psychoeducational Group Note  Date:  02/27/2021 Time:  8:48 PM  Group Topic/Focus:  Wrap-Up Group:   The focus of this group is to help patients review their daily goal of treatment and discuss progress on daily workbooks.  Participation Level:  Active  Participation Quality:  Appropriate  Affect:  Appropriate  Cognitive:  Appropriate  Insight:  Appropriate  Engagement in Group:  Engaged  Modes of Intervention:  Discussion  Additional Comments:  patient said his day was a 7. His goal for today be in control and he did not achieved his goal. Coping skills helpful talking.   Charna Busman Long 02/27/2021, 8:48 PM

## 2021-02-27 NOTE — Progress Notes (Signed)
   02/27/21 1200  Psych Admission Type (Psych Patients Only)  Admission Status Voluntary  Psychosocial Assessment  Patient Complaints None  Eye Contact Fair  Facial Expression Flat  Affect Appropriate to circumstance  Speech Logical/coherent  Interaction Assertive  Motor Activity Slow  Appearance/Hygiene Unremarkable  Behavior Characteristics Cooperative  Mood Preoccupied  Aggressive Behavior  Effect No apparent injury  Thought Process  Coherency WDL  Content WDL  Delusions None reported or observed  Perception WDL  Hallucination None reported or observed  Judgment Impaired  Confusion None  Danger to Self  Current suicidal ideation? Denies  Self-Injurious Behavior No self-injurious ideation or behavior indicators observed or expressed   Agreement Not to Harm Self Yes  Description of Agreement Verbal contract  Danger to Others  Danger to Others None reported or observed

## 2021-02-27 NOTE — [facility] Group Notes (Signed)
Pt did not attend Psychoeducational group. 

## 2021-02-28 ENCOUNTER — Encounter (HOSPITAL_COMMUNITY): Payer: Self-pay

## 2021-02-28 NOTE — Progress Notes (Signed)
Patient seen and interviewed with student. Reviewed noted and findings with student. Discussed plan. Noted edited by this Clinical research associate for accuracy and clarity.    [facility] Medical Student Progress Note  02/28/2021 3:42 PM Jeffery Baldwin  MRN:  [id number] Subjective:  Patient seen and chart reviewed. Patient reports that "brain is slow. It feels like my thoughts are not connecting" due to medication. He says that his "mental fog is like my thoughts are not getting to my mouth, like its filled with peanut butter." He reports that sleep was good. He reports some trouble getting to bed, but that he got 8 hours of uninterrupted sleep. He reports some sluggishness due to Risperdal at bedtime, but thinks that he is not as drowsy as when he was receiving Risperdal twice a day. He thinks that the Risperdal is "slowing down his thoughts to prevent him from overthinking." He reports his anxiety at 5/10 in the AM, but during the interview he denies any anxiety. He reports his mood as good. He denies SI, HI, AVH. He reports ruminating on particular words or phrases that people around him say and feels like he is able to draw special meaning from it. He says that his brain works differently than others and that he is able to connect information that other cant. He says that he feels like his condition is improved from a few days ago.   We discussed drowsiness due to Risperdal and continuing at current dose of 4mg  at bedtime for another day before thinking about making changes. Patient agreed with plan and was thankful for the conversation.  Principal Problem: Psychosis, 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HCC) Diagnosis: Principal Problem:   Psychosis, 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HCC) Active Problems:   Generalized anxiety disorder   Mild episode of recurrent major depressive disorder (HCC)   Paranoia (HCC)   Substance use disorder  Total Time spent with patient:   Past Psychiatric History: See H&P  Past Medical History:   Past Medical History:  Diagnosis Date   Chronic pain syndrome    Heroin abuse (HCC)    Nerve root avulsion    Spinal cord injury at C5-C7 level without injury of spinal bone (HCC) 2011    Past Surgical History:  Procedure Laterality Date   nerve root repair     Family History: History reviewed. No pertinent family history. Family Psychiatric  History: See H&P Social History:  Social History   Substance and Sexual Activity  Alcohol Use Yes   Alcohol/week: 0.0 standard drinks   Comment: occasional     Social History   Substance and Sexual Activity  Drug Use Yes   Comment: heroine    Social History   Socioeconomic History   Marital status: Single    Spouse name: Not on file   Number of children: Not on file   Years of education: Not on file   Highest education level: Not on file  Occupational History   Not on file  Tobacco Use   Smoking status: Some Days    Packs/day: 0.20    Years: 7.00    Pack years: 1.40    Types: Cigarettes   Smokeless tobacco: Never   Tobacco comments:    decreased smoking  Substance and Sexual Activity   Alcohol use: Yes    Alcohol/week: 0.0 standard drinks    Comment: occasional   Drug use: Yes    Comment: heroine   Sexual activity: Yes  Other Topics Concern   Not on file  Social  History Narrative   ** Merged History Encounter **       Social Determinants of Health   Financial Resource Strain: Not on file  Food Insecurity: Not on file  Transportation Needs: Not on file  Physical Activity: Not on file  Stress: Not on file  Social Connections: Not on file   Additional Social History:                         Sleep: Good  Appetite:  Fair  Current Medications: Current Facility-Administered Medications  Medication Dose Route Frequency Provider Last Rate Last Admin   acetaminophen (TYLENOL) tablet 650 mg  650 mg Oral Q6H PRN Jaclyn Shaggy, PA-C   650 mg at 02/28/21 1413   alum & mag hydroxide-simeth  (MAALOX/MYLANTA) 200-200-20 MG/5ML suspension 30 mL  30 mL Oral Q4H PRN Melbourne Abts W, PA-C       benztropine (COGENTIN) tablet 0.5 mg  0.5 mg Oral Q6H PRN Claudie Revering, MD       diphenhydrAMINE (BENADRYL) injection 50 mg  50 mg Intramuscular Q6H PRN Claudie Revering, MD       feeding supplement (BOOST / RESOURCE BREEZE) liquid 1 Container  1 Container Oral Q24H Comer Locket, MD   1 Container at 02/28/21 1246   feeding supplement (ENSURE ENLIVE / ENSURE PLUS) liquid 237 mL  237 mL Oral Q24H Singleton, Amy E, MD   237 mL at 02/27/21 2000   fluticasone (FLONASE) 50 MCG/ACT nasal spray 2 spray  2 spray Each Nare BID PRN Ajibola, Ene A, NP   2 spray at 02/28/21 0808   gabapentin (NEURONTIN) capsule 300 mg  300 mg Oral TID Claudie Revering, MD   300 mg at 02/28/21 2423   risperiDONE (RISPERDAL M-TABS) disintegrating tablet 1 mg  1 mg Oral Q8H PRN Claudie Revering, MD       And   LORazepam (ATIVAN) tablet 1 mg  1 mg Oral PRN Claudie Revering, MD       And   ziprasidone (GEODON) injection 20 mg  20 mg Intramuscular PRN Claudie Revering, MD       magnesium hydroxide (MILK OF MAGNESIA) suspension 30 mL  30 mL Oral Daily PRN Melbourne Abts W, PA-C       multivitamin with minerals tablet 1 tablet  1 tablet Oral Daily Claudie Revering, MD   1 tablet at 02/28/21 5361   nicotine polacrilex (NICORETTE) gum 2 mg  2 mg Oral PRN Jaclyn Shaggy, PA-C   2 mg at 02/28/21 1246   risperiDONE (RISPERDAL M-TABS) disintegrating tablet 4 mg  4 mg Oral QHS Mariel Craft, MD   4 mg at 02/27/21 1958   thiamine tablet 100 mg  100 mg Oral Daily Claudie Revering, MD   100 mg at 02/28/21 4431    Lab Results:  Results for orders placed or performed during the hospital encounter of 02/23/21 (from the past 48 hour(s))  Ferritin     Status: None   Collection Time: 02/26/21  6:28 PM  Result Value Ref Range   Ferritin 208 24 - 336 ng/mL    Comment: Performed at [facility], [address], [place],  [place] [address]  Iron and TIBC     Status: None   Collection Time: 02/26/21  6:28 PM  Result Value Ref Range   Iron 100 45 - 182 ug/dL   TIBC 676 195 -  450 ug/dL   Saturation Ratios 27 17.9 - 39.5 %   UIBC 277 ug/dL    Comment: Performed at [facility], [address] [place], [place] [address]  VITAMIN D 25 Hydroxy (Vit-D Deficiency, Fractures)     Status: None   Collection Time: 02/26/21  6:28 PM  Result Value Ref Range   Vit D, 25-Hydroxy 33.49 30 - 100 ng/mL    Comment: (NOTE) Vitamin D deficiency has been defined by the Institute of Medicine  and an Endocrine Society practice guideline as a level of serum 25-OH  vitamin D less than 20 ng/mL (1,2). The Endocrine Society went on to  further define vitamin D insufficiency as a level between 21 and 29  ng/mL (2).  1. IOM (Institute of Medicine). 2010. Dietary reference intakes for  calcium and D. [place]: [employer]. 2. Holick MF, Binkley Gibbon, Bischoff-Ferrari HA, et al. Evaluation,  treatment, and prevention of vitamin D deficiency: an Endocrine  Society clinical practice guideline, JCEM. 2011 Jul; 96(7): 1911-30.  Performed at [facility], [address], [place], [place] [address]     Blood Alcohol level:  Lab Results  Component Value Date   [facility] <10 02/22/2021   ETH <10 01/23/2021    Metabolic Disorder Labs: Lab Results  Component Value Date   HGBA1C 4.9 02/22/2021   MPG 93.93 02/22/2021   No results found for: PROLACTIN Lab Results  Component Value Date   CHOL 166 02/22/2021   TRIG 42 02/22/2021   HDL 93 02/22/2021   CHOLHDL 1.8 02/22/2021   VLDL 8 02/22/2021   LDLCALC 65 02/22/2021    Physical Findings: AIMS:  , ,  ,  ,    CIWA:  CIWA-Ar Total: 0 COWS:     Musculoskeletal: Strength & Muscle Tone: within normal limits Gait & Station: normal Patient leans: N/A  Psychiatric Specialty Exam:  Presentation  General Appearance: Appropriate for Environment  (Unkempt hair)  Eye Contact:Good  Speech:Clear and Coherent; Normal Rate  Speech Volume:Normal  Handedness:-- (Not assessed)   Mood and Affect  Mood:Euthymic  Affect:Appropriate; Congruent; Full Range   Thought Process  Thought Processes:Coherent; Goal Directed  Descriptions of Associations:Intact  Orientation:Full (Time, Place and Person)  Thought Content:Paranoid Ideation  History of Schizophrenia/Schizoaffective disorder:No  Duration of Psychotic Symptoms:Less than six months  Hallucinations:Hallucinations: None  Ideas of Reference:Paranoia; Delusions  Suicidal Thoughts:Suicidal Thoughts: No  Homicidal Thoughts:Homicidal Thoughts: No   Sensorium  Memory:Immediate Good; Recent Fair; Remote Fair  Judgment:Fair  Insight:Fair   Executive Functions  Concentration:Fair  Attention Span:Good  Recall:-- (Not assessed)  Fund of Knowledge:Fair  Language:Fair   Psychomotor Activity  Psychomotor Activity:Psychomotor Activity: Normal   Assets  Assets:Communication Skills   Sleep  Sleep:Sleep: Good Number of Hours of Sleep: 8    Physical Exam: Physical Exam Vitals reviewed.  Constitutional:      General: He is not in acute distress.    Appearance: He is not ill-appearing or toxic-appearing.  HENT:     Head: Normocephalic and atraumatic.     Nose: Nose normal.  Eyes:     General: No scleral icterus.    Extraocular Movements: Extraocular movements intact.  Pulmonary:     Effort: Pulmonary effort is normal.  Musculoskeletal:        General: Normal range of motion.     Cervical back: Normal range of motion.  Skin:    General: Skin is dry.  Neurological:     Mental  Status: He is alert.  Psychiatric:        Mood and Affect: Mood is anxious.        Speech: Speech normal.        Behavior: Behavior is cooperative.        Thought Content: Thought content is paranoid and delusional.   Review of Systems  All other systems reviewed and are  negative. Blood pressure 106/66, pulse 88, temperature 97.7 F (36.5 C), temperature source Oral, resp. rate 16, height 5\' 11"  (1.803 m), weight 64.6 kg, SpO2 100 %. Body mass index is 19.87 kg/m.   Treatment Plan Summary: Daily contact with patient to assess and evaluate symptoms and progress in treatment  The patient is a 30 year old male with prior diagnoses of ADHD, anxiety, MDD, substance abuse and history of paranoia who was admitted with worsening psychotic symptoms and suicidal ideation in the context of substance use (THC, tramadol, Klonopin).  Differential diagnosis includes substance-induced psychotic disorder versus primary psychotic disorder versus mood disorder with psychotic features. Patient is tolerating risperidone. Patient requires further inpatient psychiatric hospitalization for treatment and stabilization of psychotic symptoms.  Psychosis, unspecified type - Continue Risperdal 4mg  qhs. Will reassess for tolerance tomorrow. Delusions appear to be stabilizing. Previously held delusions, especially longstanding ones, may remain as part of encoded memory regardless of therapeutic antipsychotic level.  - Benztropine 0.5mg  q6h PRN for tremors - Diphenhydramine 50mg  IM q6h PRN for acute dystonia  Polysubstance use disorder (THC, Klonopin, Tramadol, Roxicodone) - Lorazepam 1mg  PRN for CIWA>10 for benzodiazepine withdrawal. Most recent CIWA was 0 - Continue Nicorette gum 2mg  - Continue thiamine 100mg  daily - Continue multivitamin with minerals 1 tablet daily - Substance use treatment after discharge  HCV - HCV RNA indicates current infection (332,000 IU/mL) - Possible GI consult and followup outpatient  Nerve pain (RUE) - Continue gabapentin 300mg  TID  Allergic rhinitis - Continue fluticasone nasal spray 38 BID PRN  Diet - Regular diet - Boost feeding supplement daily - Ensure feeding supplement daily  Dispo Ongoing  , Medical  Student 02/28/2021, 3:42 PM

## 2021-02-28 NOTE — Group Note (Signed)
Recreation Therapy Group Note   Group Topic:Team Building  Group Date: 02/28/2021 Start Time: 1000 End Time: 1045 Facilitators: Caroll Rancher, LRT/CTRS Location: [address] Dayroom  Goal Area(s) Addresses:  Patient will effectively work with peer towards shared goal.  Patient will identify skills used to make activity successful.  Patient will share challenges and verbalize solution-driven approaches used. Patient will identify how skills used during activity can be used to reach post d/c goals.   Group Description: [employer]. Patients were provided the following materials: 2 drinking straws, 5 rubber bands, 5 paper clips, 2 index cards and 2 drinking cups. Using the provided materials patients were asked to build a launching mechanism to launch a ping pong ball across the room, approximately 10 feet. Patients were divided into teams of 3-5. Instructions required all materials be incorporated into the device, functionality of items left to the peer group's discretion.   Affect/Mood: Appropriate   Participation Level: Active and Engaged   Participation Quality: Independent   Behavior: Appropriate and Attentive    Speech/Thought Process: Focused   Insight: Good   Judgement: Good   Modes of Intervention: Group work and Team-building   Patient Response to Interventions:  Air cabin crew   Education Outcome:  Acknowledges education and In group clarification offered    Clinical Observations/Individualized Feedback:  Pt was actively engaged with peers in coming up with a concept.  Pt seemed to show patience when working with one of his peers.  Pt remained calm and in good spirits throughout the session.  Pt identified teamwork as one skill that was used during the activity.  LRT unable to complete processing due to fire drill.    Plan: Continue to engage patient in RT group sessions 2-3x/week.   Caroll Rancher, LRT/CTRS 02/28/2021 1:02 PM

## 2021-02-28 NOTE — [facility] Group Notes (Signed)
Patient displayed  anxiousness today as evidence by his coming and going, in and out of the room. Staff work with him on self-care activities to help when he feels frigidity. Staff taught him to listen to his body and mind and taking time to care for himself. Staff gave patient a work sheet on self-care to use as an exercise when he feels anxious.Staff encouraged  him to keep working toward his wellbeing.

## 2021-02-28 NOTE — Progress Notes (Signed)
  D: Patient denies SI/HI. When asked if pt. Was hearing voices pt. Replied "I don't know". Pt. Was talking to himself at the med window and appeared to be responding to internal stimuli.Pt. complained of arm pain 5/10. Pt. Rated anxiety and depression 5/10. Pt was out in open areas and attended group outside. A:  Patient took scheduled medicine.  Pt. Had flonase for seasonal allergies and 650 mg of Tylenol for arm pain. Support and encouragement provided Routine safety checks conducted every 15 minutes. Patient  Informed to notify staff with any concerns.   R:  Safety maintained.

## 2021-02-28 NOTE — Group Note (Signed)
LCSW Group Therapy Note   Group Date: 02/28/2021 Start Time: 1300 End Time: 1400   Type of Therapy and Topic:  Group Therapy: Reframing Thoughts  Due to acuity on the unit, Social work met with patients individually and provided time for patients to ask questions.  Patient provided with packet and education about the [facility]   Kanon Novosel, LCSW, LCAS Clincal Social Worker  [facility]  

## 2021-02-28 NOTE — Progress Notes (Signed)
Adult Psychoeducational Group Note  Date:  02/28/2021 Time:  10:24 PM  Group Topic/Focus:  Wrap-Up Group:   The focus of this group is to help patients review their daily goal of treatment and discuss progress on daily workbooks.  Participation Level:  Active  Participation Quality:  Appropriate  Affect:  Appropriate  Cognitive:  Appropriate  Insight: Appropriate  Engagement in Group:  Developing/Improving  Modes of Intervention:  Discussion  Additional Comments:  Pt stated his goal for today was to focus on his treatment plan. Pt stated he accomplished his goal today. Pt stated he talked with his doctor and with his social worker about his care today. Pt rated his overall day a 10. Pt stated he made no calls today. Pt stated he felt better about himself today. Pt stated he was able to attend all meals. Pt stated he took all medications provided today. Pt stated he attend all groups held today. Pt stated his appetite was pretty good today. Pt rated sleep last night was pretty good. Pt stated the goal tonight was to get some rest. Pt stated he had some physical pain tonight. Pt stated he had some moderate pain in his right arm tonight. Pt rated the moderate pain in his right arm a 5 on the pain level scale. Pt nurse was updated on the situation. Pt deny visual hallucinations and auditory issues tonight. Pt denies thoughts of harming himself or others.   Pt stated he would alert staff if anything changed.  Jeffery Baldwin 02/28/2021, 10:24 PM

## 2021-02-28 NOTE — Progress Notes (Signed)
D: Patient presents with appropriate affect. Patient denies SI/HI at this time. When asked if patient was experiencing AVH patient response was "I don't know how to answer that." Patient does appear to be responding to internal stimuli at times. Patient contracts for safety.  A: Provided positive reinforcement and encouragement.  R: Patient cooperative and receptive to efforts. Patient remains safe on the unit.   02/28/21 2118  Psych Admission Type (Psych Patients Only)  Admission Status Voluntary  Psychosocial Assessment  Patient Complaints Anxiety  Eye Contact Fair  Facial Expression Flat  Affect Appropriate to circumstance  Speech Logical/coherent  Interaction Assertive  Motor Activity Slow  Appearance/Hygiene Unremarkable  Behavior Characteristics Cooperative;Appropriate to situation  Mood Anxious;Depressed  Thought Process  Coherency WDL  Content WDL  Delusions None reported or observed  Perception WDL  Hallucination Auditory  Judgment Impaired  Confusion None  Danger to Self  Current suicidal ideation? Denies  Self-Injurious Behavior No self-injurious ideation or behavior indicators observed or expressed   Agreement Not to Harm Self Yes  Description of Agreement Verbal contract  Danger to Others  Danger to Others None reported or observed

## 2021-02-28 NOTE — BH IP Treatment Plan (Signed)
Interdisciplinary Treatment and Diagnostic Plan Update  02/28/2021 Time of Session: 10:35am COOLIDGE GOSSARD MRN: [id number]  Principal Diagnosis: Psychosis, unspecified psychosis type [facility])  Secondary Diagnoses: Principal Problem:   Psychosis, 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HCC) Active Problems:   Generalized anxiety disorder   Mild episode of recurrent major depressive disorder (HCC)   Paranoia (HCC)   Substance use disorder   Current Medications:  Current Facility-Administered Medications  Medication Dose Route Frequency Provider Last Rate Last Admin   acetaminophen (TYLENOL) tablet 650 mg  650 mg Oral Q6H PRN Jaclyn Shaggy, PA-C   650 mg at 02/25/21 2112   alum & mag hydroxide-simeth (MAALOX/MYLANTA) 200-200-20 MG/5ML suspension 30 mL  30 mL Oral Q4H PRN Melbourne Abts W, PA-C       benztropine (COGENTIN) tablet 0.5 mg  0.5 mg Oral Q6H PRN Claudie Revering, MD       diphenhydrAMINE (BENADRYL) injection 50 mg  50 mg Intramuscular Q6H PRN Claudie Revering, MD       feeding supplement (BOOST / RESOURCE BREEZE) liquid 1 Container  1 Container Oral Q24H Comer Locket, MD   1 Container at 02/27/21 1109   feeding supplement (ENSURE ENLIVE / ENSURE PLUS) liquid 237 mL  237 mL Oral Q24H Singleton, Amy E, MD   237 mL at 02/27/21 2000   fluticasone (FLONASE) 50 MCG/ACT nasal spray 2 spray  2 spray Each Nare BID PRN Ajibola, Ene A, NP   2 spray at 02/28/21 7322   gabapentin (NEURONTIN) capsule 300 mg  300 mg Oral TID Claudie Revering, MD   300 mg at 02/28/21 0254   risperiDONE (RISPERDAL M-TABS) disintegrating tablet 1 mg  1 mg Oral Q8H PRN Claudie Revering, MD       And   LORazepam (ATIVAN) tablet 1 mg  1 mg Oral PRN Claudie Revering, MD       And   ziprasidone (GEODON) injection 20 mg  20 mg Intramuscular PRN Claudie Revering, MD       magnesium hydroxide (MILK OF MAGNESIA) suspension 30 mL  30 mL Oral Daily PRN Melbourne Abts W, PA-C       multivitamin with minerals tablet 1 tablet  1 tablet Oral  Daily Claudie Revering, MD   1 tablet at 02/28/21 2706   nicotine polacrilex (NICORETTE) gum 2 mg  2 mg Oral PRN Jaclyn Shaggy, PA-C   2 mg at 02/28/21 2376   risperiDONE (RISPERDAL M-TABS) disintegrating tablet 4 mg  4 mg Oral QHS Mariel Craft, MD   4 mg at 02/27/21 1958   thiamine tablet 100 mg  100 mg Oral Daily Claudie Revering, MD   100 mg at 02/28/21 2831   PTA Medications: Medications Prior to Admission  Medication Sig Dispense Refill Last Dose   busPIRone (BUSPAR) 10 MG tablet Take 1 tablet (10 mg total) by mouth 3 (three) times daily. (Patient taking differently: Take 10 mg by mouth at bedtime.) 90 tablet 2 Past Month    Patient Stressors: Marital or family conflict    Patient Strengths: Motivation for treatment/growth   Treatment Modalities: Medication Management, Group therapy, Case management,  1 to 1 session with clinician, Psychoeducation, Recreational therapy.   Physician Treatment Plan for Primary Diagnosis: Psychosis, 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HCC) Long Term Goal(s): Improvement in symptoms so as ready for discharge   Short Term Goals: Ability to identify changes in lifestyle to reduce recurrence of condition will improve Ability to verbalize feelings  will improve Ability to disclose and discuss suicidal ideas Ability to demonstrate self-control will improve Ability to identify and develop effective coping behaviors will improve Ability to maintain clinical measurements within normal limits will improve Compliance with prescribed medications will improve Ability to identify triggers associated with substance abuse/mental health issues will improve  Medication Management: Evaluate patient's response, side effects, and tolerance of medication regimen.  Therapeutic Interventions: 1 to 1 sessions, Unit Group sessions and Medication administration.  Evaluation of Outcomes: Progressing  Physician Treatment Plan for Secondary Diagnosis: Principal Problem:    Psychosis, 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HCC) Active Problems:   Generalized anxiety disorder   Mild episode of recurrent major depressive disorder (HCC)   Paranoia (HCC)   Substance use disorder  Long Term Goal(s): Improvement in symptoms so as ready for discharge   Short Term Goals: Ability to identify changes in lifestyle to reduce recurrence of condition will improve Ability to verbalize feelings will improve Ability to disclose and discuss suicidal ideas Ability to demonstrate self-control will improve Ability to identify and develop effective coping behaviors will improve Ability to maintain clinical measurements within normal limits will improve Compliance with prescribed medications will improve Ability to identify triggers associated with substance abuse/mental health issues will improve     Medication Management: Evaluate patient's response, side effects, and tolerance of medication regimen.  Therapeutic Interventions: 1 to 1 sessions, Unit Group sessions and Medication administration.  Evaluation of Outcomes: Progressing   RN Treatment Plan for Primary Diagnosis: Psychosis, 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HCC) Long Term Goal(s): Knowledge of disease and therapeutic regimen to maintain health will improve  Short Term Goals: Ability to remain free from injury will improve, Ability to participate in decision making will improve, Ability to verbalize feelings will improve, and Compliance with prescribed medications will improve  Medication Management: RN will administer medications as ordered by provider, will assess and evaluate patient's response and provide education to patient for prescribed medication. RN will report any adverse and/or side effects to prescribing provider.  Therapeutic Interventions: 1 on 1 counseling sessions, Psychoeducation, Medication administration, Evaluate responses to treatment, Monitor vital signs and CBGs as ordered, Perform/monitor CIWA, COWS, AIMS  and Fall Risk screenings as ordered, Perform wound care treatments as ordered.  Evaluation of Outcomes: Progressing   LCSW Treatment Plan for Primary Diagnosis: Psychosis, 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HCC) Long Term Goal(s): Safe transition to appropriate next level of care at discharge, Engage patient in therapeutic group addressing interpersonal concerns.  Short Term Goals: Engage patient in aftercare planning with referrals and resources, Increase social support, Increase ability to appropriately verbalize feelings, Increase emotional regulation, and Increase skills for wellness and recovery  Therapeutic Interventions: Assess for all discharge needs, 1 to 1 time with Social worker, Explore available resources and support systems, Assess for adequacy in community support network, Educate family and significant other(s) on suicide prevention, Complete Psychosocial Assessment, Interpersonal group therapy.  Evaluation of Outcomes: Progressing   Progress in Treatment: Attending groups: Yes. Participating in groups: Yes. Taking medication as prescribed: Yes. Toleration medication: Yes. Family/Significant other contact made: No, will contact:  have attempted to reach father Patient understands diagnosis: No. Discussing patient identified problems/goals with staff: Yes. Medical problems stabilized or resolved: Yes. Denies suicidal/homicidal ideation: Yes. Issues/concerns per patient self-inventory: No.   New problem(s) identified: No, Describe:  none  New Short Term/Long Term Goal(s): medication stabilization, elimination of SI thoughts, development of comprehensive mental wellness plan.    Patient Goals:  Did not attend  Discharge Plan or  Barriers: Patient recently admitted. CSW will continue to follow and assess for appropriate referrals and possible discharge planning.    Reason for Continuation of Hospitalization: Delusions  Hallucinations Medication stabilization  Estimated  Length of Stay: 3-5 days   Scribe for Treatment Team: Otelia Santee, LCSW 02/28/2021 11:44 AM

## 2021-02-28 NOTE — Progress Notes (Signed)
Pt talking to himself up at the med window and did a pattern with his hand holding his breeze drink.

## 2021-03-01 MED ORDER — LORATADINE 10 MG PO TABS
10.0000 mg | ORAL_TABLET | Freq: Every day | ORAL | Status: DC
  Administered 2021-03-01 – 2021-03-09 (×9): 10 mg via ORAL
  Filled 2021-03-01 (×13): qty 1
  Filled 2021-03-01: qty 10

## 2021-03-01 MED ORDER — FLUTICASONE PROPIONATE 50 MCG/ACT NA SUSP
2.0000 | Freq: Two times a day (BID) | NASAL | Status: DC
  Administered 2021-03-01 – 2021-03-10 (×19): 2 via NASAL
  Filled 2021-03-01: qty 16

## 2021-03-01 MED ORDER — DIPHENHYDRAMINE HCL 50 MG/ML IJ SOLN: 50.0000 mg | Freq: Four times a day (QID) | INTRAMUSCULAR | Status: DC | PRN

## 2021-03-01 MED ORDER — WHITE PETROLATUM EX OINT
TOPICAL_OINTMENT | CUTANEOUS | Status: AC
  Filled 2021-03-01: qty 5

## 2021-03-01 MED ORDER — MONTELUKAST SODIUM 10 MG PO TABS
10.0000 mg | ORAL_TABLET | ORAL | Status: DC
  Administered 2021-03-01 – 2021-03-10 (×11): 10 mg via ORAL
  Filled 2021-03-01 (×12): qty 1
  Filled 2021-03-01: qty 10
  Filled 2021-03-01: qty 1

## 2021-03-01 MED ORDER — DIPHENHYDRAMINE HCL 25 MG PO CAPS: 50.0000 mg | ORAL_CAPSULE | Freq: Four times a day (QID) | ORAL | Status: DC | PRN

## 2021-03-01 MED ORDER — RISPERIDONE 2 MG PO TBDP
6.0000 mg | ORAL_TABLET | Freq: Every day | ORAL | Status: DC
  Administered 2021-03-01 – 2021-03-03 (×3): 6 mg via ORAL
  Filled 2021-03-01 (×5): qty 3

## 2021-03-01 NOTE — [facility] Group Notes (Signed)
Adult Psychoeducational Group Note  Date:  03/01/2021 Time:  8:59 PM  Group Topic/Focus:  Identifying Needs:   The focus of this group is to help patients identify their personal needs that have been historically problematic and identify healthy behaviors to address their needs.  Participation Level:  Active  Participation Quality:  Attentive  Affect:  Appropriate  Cognitive:  Alert  Insight: Good  Engagement in Group:  Engaged  Modes of Intervention:  Discussion  Additional Comments  Jacalyn Lefevre 03/01/2021, 8:59 PM

## 2021-03-01 NOTE — Progress Notes (Signed)
   03/01/21 0626  Vital Signs  Temp 98 F (36.7 C)  Temp Source Oral  Pulse Rate 79  BP 117/65  BP Location Right Arm  BP Method Automatic  Patient Position (if appropriate) Standing   D: Patient denies SI/HI/AVH. Pt. Rated anxiety 3/10 and depression 5/10. Pt. Was out in open areas.  A:  Patient took scheduled medicine.  Support and encouragement provided Routine safety checks conducted every 15 minutes. Patient  Informed to notify staff with any concerns.   R:  Safety maintained.

## 2021-03-01 NOTE — Group Note (Signed)
Recreation Therapy Group Note   Group Topic:Animal Assisted Therapy   Group Date: 03/01/2021 Start Time: 1430 End Time: 1515 Facilitators: Caroll Rancher, LRT/CTRS Location: [address] Dayroom  Animal-Assisted Activity (AAA) Program Checklist/Progress Notes Patient Eligibility Criteria Checklist & Daily Group note for Rec Tx Intervention   AAA/T Program Assumption of Risk Form signed by Patient/ or Parent Legal Guardian Yes  Patient is free of allergies or severe asthma Yes  Patient reports no fear of animals Yes  Patient reports no history of cruelty to animals Yes  Patient understands his/her participation is voluntary Yes  Patient washes hands before animal contact Yes  Patient washes hands after animal contact Yes   Affect/Mood: Appropriate   Participation Level: Engaged   Participation Quality: Independent   Behavior: Appropriate   Speech/Thought Process: Focused   Insight: Good   Judgement: Good   Modes of Intervention: Pet Therapy   Patient Response to Interventions:  Engaged   Education Outcome:  Acknowledges education   Clinical Observations/Individualized Feedback:  Pt engaged with therapy dog and peers appropriately.  Pt asked appropriate questions.  Pt was engaged throughout activity.  Pt shared stories about their pets at home.     Plan: Continue to engage patient in RT group sessions 2-3x/week.   Caroll Rancher, LRT/CTRS 03/01/2021 4:24 PM

## 2021-03-01 NOTE — [facility] Group Notes (Signed)
Pt attended group and participated in discussion. 

## 2021-03-01 NOTE — Progress Notes (Signed)
D: Patient presents with appropriate affect. Patient denies SI/HI at this time. Patient avoids answering when asked about AVH but appears to be thought blocking and is slow to respond to assessment questions. Patient contracts for safety.  A: Provided positive reinforcement and encouragement.  R: Patient cooperative and receptive to efforts. Patient remains safe on the unit.   03/01/21 2111  Psych Admission Type (Psych Patients Only)  Admission Status Voluntary  Psychosocial Assessment  Patient Complaints Anxiety;Depression  Eye Contact Fair  Facial Expression Flat  Affect Appropriate to circumstance  Speech Logical/coherent  Interaction Assertive  Motor Activity Slow  Appearance/Hygiene Unremarkable  Behavior Characteristics Cooperative;Appropriate to situation  Mood Depressed;Anxious;Pleasant  Thought Process  Coherency WDL  Content WDL  Delusions None reported or observed  Perception Hallucinations  Hallucination Auditory  Judgment Impaired  Confusion None  Danger to Self  Current suicidal ideation? Denies  Self-Injurious Behavior No self-injurious ideation or behavior indicators observed or expressed   Agreement Not to Harm Self Yes  Description of Agreement Verbal contract  Danger to Others  Danger to Others None reported or observed
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
## 2021-03-02 DIAGNOSIS — F411 Generalized anxiety disorder: Secondary | ICD-10-CM | POA: Diagnosis not present

## 2021-03-02 DIAGNOSIS — F330 Major depressive disorder, recurrent, mild: Secondary | ICD-10-CM | POA: Diagnosis not present

## 2021-03-02 DIAGNOSIS — F4312 Post-traumatic stress disorder, chronic: Secondary | ICD-10-CM

## 2021-03-02 DIAGNOSIS — F1290 Cannabis use, unspecified, uncomplicated: Secondary | ICD-10-CM | POA: Diagnosis present

## 2021-03-02 DIAGNOSIS — F22 Delusional disorders: Secondary | ICD-10-CM | POA: Diagnosis not present

## 2021-03-02 DIAGNOSIS — F29 Unspecified psychosis not due to a substance or known physiological condition: Secondary | ICD-10-CM | POA: Diagnosis not present

## 2021-03-02 NOTE — [facility] Group Notes (Signed)
[facility] Group Notes:  (Nursing/MHT/Case Management/Adjunct)  Date:  03/02/2021  Time:  1:26 PM  Type of Therapy:  The focus of this group is to help patients establish daily goals to achieve during treatment and discuss how the patient can incorporate goal setting into their daily lives to aide in recovery.  Participation Level:  Active  Participation Quality:  Appropriate  Affect:  Appropriate  Cognitive:  Appropriate  Insight:  Appropriate  Engagement in Group:  Engaged  Modes of Intervention:  Orientation  Summary of Progress/Problems: Pt goal for today is to continue to work on getting better so that he can get back into the community.  Jeffery Baldwin 03/02/2021, 1:26 PM

## 2021-03-02 NOTE — [facility] Group Notes (Signed)
Pt. Attended NA meeting. 

## 2021-03-02 NOTE — Plan of Care (Signed)
?  Problem: Education: ?Goal: Emotional status will improve ?Outcome: Progressing ?Goal: Verbalization of understanding the information provided will improve ?Outcome: Progressing ?  ?Problem: Education: ?Goal: Mental status will improve ?Outcome: Not Progressing ?  ?

## 2021-03-02 NOTE — [facility] Group Notes (Signed)
Pt was given pamphlet on personal acceptance for psycho-ed group and was asked to read, review and ask questions if needed about the topic. Pt was encouraged to take notes as well.
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
## 2021-03-02 NOTE — Progress Notes (Signed)
     03/02/21 2109  Psych Admission Type (Psych Patients Only)  Admission Status Voluntary  Psychosocial Assessment  Patient Complaints Anxiety;Depression;Other (Comment) (pt stated, "having mental block from the meds")  Eye Contact Fair  Facial Expression Flat  Affect Appropriate to circumstance  Speech Logical/coherent  Interaction Assertive  Motor Activity Slow  Appearance/Hygiene Unremarkable  Behavior Characteristics Cooperative  Mood Depressed;Anxious  Thought Process  Coherency WDL  Content WDL  Delusions None reported or observed  Perception Hallucinations  Hallucination Auditory  Judgment Impaired  Confusion None  Danger to Self  Current suicidal ideation? Denies  Self-Injurious Behavior No self-injurious ideation or behavior indicators observed or expressed   Agreement Not to Harm Self Yes  Description of Agreement Verbal contract  Danger to Others  Danger to Others None reported or observed

## 2021-03-02 NOTE — Progress Notes (Signed)
Pt denies SI/HI but endorsed hearing voices.   Pt was guarded and he would not elaborate what the voices were telling pt.  Otherwise, Pt is talkative with RN and pt took medications without incident. Q 15 min safety checks remain in place.    03/02/21 0930  Psych Admission Type (Psych Patients Only)  Admission Status Voluntary  Psychosocial Assessment  Patient Complaints Anxiety;Depression  Eye Contact Fair  Facial Expression Flat  Affect Appropriate to circumstance  Speech Logical/coherent  Interaction Assertive  Motor Activity Slow  Appearance/Hygiene Unremarkable  Behavior Characteristics Cooperative;Anxious;Guarded  Mood Depressed;Anxious  Thought Process  Coherency WDL  Content WDL  Delusions None reported or observed  Perception Hallucinations  Hallucination Auditory  Judgment Impaired  Confusion None  Danger to Self  Current suicidal ideation? Denies  Self-Injurious Behavior No self-injurious ideation or behavior indicators observed or expressed   Agreement Not to Harm Self Yes  Description of Agreement Verbal contract  Danger to Others  Danger to Others None reported or observed

## 2021-03-02 NOTE — Group Note (Signed)
Recreation Therapy Group Note   Group Topic:Leisure Education  Group Date: 03/02/2021 Start Time: 1000 End Time: 1050 Facilitators: Caroll Rancher, LRT/CTRS Location: [address] Dayroom  Goal Area(s) Addresses:  Patient will identify positive leisure activities for use post discharge. Patient will identify at least one positive benefit of participation in leisure activities.   Group Description: Patients will be divided into 2 teams.  Each team will take turns pulling a slip of paper from the can with an activity on it.  Members of the teams will take turns drawing what is on the paper.  Each team gets one minute to draw and guess what the picture is.  If the team gets the correct answer, they receive a point.  If the team doesn't get the answer, the other team can steal the point.  The team with the most points wins the game.   Affect/Mood: Appropriate   Participation Level: Active and Engaged   Participation Quality: Independent   Behavior: Appropriate and Attentive    Speech/Thought Process: Focused   Insight: Good   Judgement: Good   Modes of Intervention: Competitive Play   Patient Response to Interventions:  Attentive and Engaged   Education Outcome:  Acknowledges education and In group clarification offered    Clinical Observations/Individualized Feedback: Pt arrived a little late to group.  Once in group, a peer explained the activity pt.  Pt began to make guesses at the pictures but was hesitant to draw a picture.  Pt felt he was unable to draw but LRT encouraged pt by telling him no one in the room had perfect drawings.  Pt seemed to be receptive and gave it a try.  Pt was pleasant and smiling during activity.   Plan: Continue to engage patient in RT group sessions 2-3x/week.   Caroll Rancher, LRT/CTRS 03/02/2021 12:48 PM

## 2021-03-02 NOTE — Group Note (Signed)
[facility] LCSW Group Therapy Note   Group Date: 03/02/2021 Start Time: 1300 End Time: 1400   Type of Therapy/Topic:  Group Therapy:  Emotion Regulation  Participation Level:  Active    Description of Group:    The purpose of this group is to assist patients in learning to regulate negative emotions and experience positive emotions. Patients will be guided to discuss ways in which they have been vulnerable to their negative emotions. These vulnerabilities will be juxtaposed with experiences of positive emotions or situations, and patients challenged to use positive emotions to combat negative ones. Special emphasis will be placed on coping with negative emotions in conflict situations, and patients will process healthy conflict resolution skills.  Therapeutic Goals: Patient will identify two positive emotions or experiences to reflect on in order to balance out negative emotions:  Patient will label two or more emotions that they find the most difficult to experience:  Patient will be able to demonstrate positive conflict resolution skills through discussion or role plays:   Summary of Patient Progress: Pt attended and participated in group. Pt was able to share emotions and how they are expressed positively and negatively. Pt was able to connect how physical health and mental health impact each other.      Therapeutic Modalities:   Cognitive Behavioral Therapy Feelings Identification Dialectical Behavioral Therapy   Otelia Santee, LCSW

## 2021-03-03 DIAGNOSIS — F29 Unspecified psychosis not due to a substance or known physiological condition: Secondary | ICD-10-CM | POA: Diagnosis not present

## 2021-03-03 MED ORDER — SERTRALINE HCL 50 MG PO TABS
50.0000 mg | ORAL_TABLET | Freq: Every day | ORAL | Status: DC
  Administered 2021-03-04: 50 mg via ORAL
  Filled 2021-03-03 (×4): qty 1

## 2021-03-03 MED ORDER — SERTRALINE HCL 25 MG PO TABS
25.0000 mg | ORAL_TABLET | Freq: Every day | ORAL | Status: AC
  Administered 2021-03-03: 25 mg via ORAL
  Filled 2021-03-03 (×2): qty 1

## 2021-03-03 MED ORDER — GABAPENTIN 400 MG PO CAPS
400.0000 mg | ORAL_CAPSULE | Freq: Three times a day (TID) | ORAL | Status: DC
  Administered 2021-03-03 – 2021-03-10 (×21): 400 mg via ORAL
  Filled 2021-03-03: qty 30
  Filled 2021-03-03: qty 1
  Filled 2021-03-03: qty 30
  Filled 2021-03-03 (×12): qty 1
  Filled 2021-03-03: qty 30
  Filled 2021-03-03 (×13): qty 1

## 2021-03-03 MED ORDER — WHITE PETROLATUM EX OINT
TOPICAL_OINTMENT | CUTANEOUS | Status: AC
  Filled 2021-03-03: qty 5

## 2021-03-03 NOTE — Progress Notes (Signed)
   03/03/21 2111  Psych Admission Type (Psych Patients Only)  Admission Status Voluntary  Psychosocial Assessment  Patient Complaints None  Eye Contact Fair  Facial Expression Pensive;Flat  Affect Appropriate to circumstance  Speech Logical/coherent  Interaction Assertive  Motor Activity Slow  Appearance/Hygiene Unremarkable  Behavior Characteristics Cooperative;Appropriate to situation  Mood Pleasant  Thought Process  Coherency WDL  Content WDL  Delusions None reported or observed  Perception Hallucinations  Hallucination Auditory  Judgment Impaired  Confusion None  Danger to Self  Current suicidal ideation? Denies  Self-Injurious Behavior No self-injurious ideation or behavior indicators observed or expressed   Agreement Not to Harm Self Yes  Description of Agreement Verbal Contract  Danger to Others  Danger to Others None reported or observed

## 2021-03-03 NOTE — Progress Notes (Signed)
Pt denies SI/HI/AVH.  Pt is calm and cooperative.  Pt presents with disorganized thinking.  Pt taking medications without incident and no adverse reactions were noted. Pt remains safe with q 15 min checks in place.

## 2021-03-03 NOTE — Group Note (Signed)
Recreation Therapy Group Note   Group Topic:Communication  Group Date: 03/03/2021 Start Time: 1010 End Time: 1050 Facilitators: Caroll Rancher, LRT/CTRS Location: [address] Dayroom  Goal Area(s) Addresses:  Patient will effectively listen to complete activity.  Patient will identify communication skills used to make activity successful.  Patient will identify how skills used during activity can be used to reach post d/c goals.    Group Description: Geometric Drawings.  Three volunteers from the peer group will be shown an abstract picture with a particular arrangement of geometrical shapes.  Each round, one 'speaker' will describe the pattern, as accurately as possible without revealing the image to the group.  The remaining group members will listen and draw the picture to reflect how it is described to them. Patients with the role of 'listener' cannot ask clarifying questions but, may request that the speaker repeat a direction. Once the drawings are complete, the presenter will show the rest of the group the picture and compare how close each person came to drawing the picture. LRT will facilitate a post-activity discussion regarding effective communication and the importance of planning, listening, and asking for clarification in daily interactions with others.   Affect/Mood: Appropriate   Participation Level: Active and Engaged   Participation Quality: Independent   Behavior: Appropriate and Attentive    Speech/Thought Process: Focused   Insight: Good   Judgement: Good   Modes of Intervention: Activity   Patient Response to Interventions:  Attentive and Engaged   Education Outcome:  Acknowledges education and In group clarification offered    Clinical Observations/Individualized Feedback: Pt was pleasant and bright during activity.  Pt was one of the presenters and felt a little intimidated when pt saw the picture he would be describing to the group. Pt would show some  frustration while describing the picture and had to be redirected from looking over a peers shoulder to give hints to the group about how to draw the picture.  Pt peer expressed pt was very detailed in giving the description.  Pt some of the drawings were confusing at times but pt continued with the activity.      Plan: Continue to engage patient in RT group sessions 2-3x/week.   Caroll Rancher, LRT/CTRS 03/03/2021 12:26 PM

## 2021-03-03 NOTE — Progress Notes (Signed)
Pt did not attend orientation/goal setting group. 
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
## 2021-03-04 ENCOUNTER — Encounter (HOSPITAL_COMMUNITY): Payer: Self-pay

## 2021-03-04 DIAGNOSIS — F22 Delusional disorders: Secondary | ICD-10-CM | POA: Diagnosis not present

## 2021-03-04 DIAGNOSIS — F29 Unspecified psychosis not due to a substance or known physiological condition: Secondary | ICD-10-CM | POA: Diagnosis not present

## 2021-03-04 DIAGNOSIS — F330 Major depressive disorder, recurrent, mild: Secondary | ICD-10-CM | POA: Diagnosis not present

## 2021-03-04 DIAGNOSIS — F411 Generalized anxiety disorder: Secondary | ICD-10-CM | POA: Diagnosis not present

## 2021-03-04 MED ORDER — PALIPERIDONE PALMITATE ER 234 MG/1.5ML IM SUSY: 234.0000 mg | PREFILLED_SYRINGE | Freq: Once | INTRAMUSCULAR | Status: DC

## 2021-03-04 MED ORDER — SERTRALINE HCL 25 MG PO TABS
75.0000 mg | ORAL_TABLET | Freq: Every day | ORAL | Status: AC
  Administered 2021-03-05: 75 mg via ORAL
  Filled 2021-03-04: qty 3

## 2021-03-04 MED ORDER — SERTRALINE HCL 100 MG PO TABS
100.0000 mg | ORAL_TABLET | Freq: Every day | ORAL | Status: DC
  Administered 2021-03-06: 100 mg via ORAL
  Filled 2021-03-04 (×4): qty 1

## 2021-03-04 MED ORDER — RISPERIDONE 2 MG PO TBDP
2.0000 mg | ORAL_TABLET | Freq: Every day | ORAL | Status: DC
  Administered 2021-03-04 – 2021-03-06 (×3): 2 mg via ORAL
  Filled 2021-03-04 (×6): qty 1

## 2021-03-04 MED ORDER — PALIPERIDONE PALMITATE ER 156 MG/ML IM SUSY
156.0000 mg | PREFILLED_SYRINGE | Freq: Once | INTRAMUSCULAR | Status: AC
  Administered 2021-03-08: 156 mg via INTRAMUSCULAR

## 2021-03-04 MED ORDER — PALIPERIDONE PALMITATE ER 234 MG/1.5ML IM SUSY
234.0000 mg | PREFILLED_SYRINGE | Freq: Once | INTRAMUSCULAR | Status: AC
  Administered 2021-03-04: 234 mg via INTRAMUSCULAR

## 2021-03-04 NOTE — Group Note (Signed)
Recreation Therapy Group Note   Group Topic:Team Building  Group Date: 03/04/2021 Start Time: 1010 End Time: 1050 Facilitators: Caroll Rancher, LRT/CTRS Location: [address] Dayroom  Goal Area(s) Addresses:  Patient will effectively work with peer towards shared goal.  Patient will identify skills used to make activity successful.  Patient will identify how skills used during activity can be applied to reach post d/c goals.   Group Description:Tallest [employer]. In teams of 5-6, patients were given 25 small craft pipe cleaners. Using the materials provided, patients were instructed to compete againt the opposing team(s) to build the tallest free-standing structure from floor level. The activity was timed; difficulty increased by Clinical research associate as Production designer, theatre/television/film continued.  Systematically resources were removed with additional directions for example, placing one arm behind their back, working in silence, and shape stipulations. LRT facilitated post-activity discussion reviewing team processes and necessary communication skills involved in completion. Patients were encouraged to reflect how the skills utilized, or not utilized, in this activity can be incorporated to positively impact support systems post discharge.  Affect/Mood: N/A   Participation Level: Did not attend    Clinical Observations/Individualized Feedback: Pt did not attend group.    Plan: Continue to engage patient in RT group sessions 2-3x/week.   Caroll Rancher, LRT/CTRS 03/04/2021 1:13 PM

## 2021-03-04 NOTE — Progress Notes (Signed)
D: Pt denies SI/HI/AVH but pt stated "how do I know that I am hearing or seeing things?" Pt verbally agrees to approach staff if these become apparent or before harming themselves/others. Rates depression 5/10. Rates anxiety 2/10. Rates pain 0/10. Pt is still congested. Pt stated "I feel really tired." Pt requests B12 to help with energy. MD notified. Pt is pleasant. Scheduled medications administered to Pt, per MD orders. RN provided support and encouragement to Pt. Q15 min safety checks implemented and continued. Pt safe on the unit. RN will continue to monitor and intervene as needed.    03/04/21 0800  Psych Admission Type (Psych Patients Only)  Admission Status Voluntary  Psychosocial Assessment  Patient Complaints Depression;Other (Comment) (lethargic)  Eye Contact Fair  Facial Expression Flat  Affect Appropriate to circumstance  Speech Logical/coherent  Interaction Assertive  Motor Activity Slow  Appearance/Hygiene Unremarkable  Behavior Characteristics Cooperative;Calm;Appropriate to situation  Mood Pleasant  Thought Process  Coherency WDL  Content WDL  Delusions None reported or observed  Perception Hallucinations  Hallucination Auditory  Judgment Impaired  Confusion None  Danger to Self  Current suicidal ideation? Denies  Self-Injurious Behavior No self-injurious ideation or behavior indicators observed or expressed   Danger to Others  Danger to Others None reported or observed

## 2021-03-04 NOTE — Group Note (Signed)
LCSW Group Therapy Note  Group Date: 03/04/2021 Start Time: 1300 End Time: 1400   Type of Therapy and Topic:  Group Therapy - Healthy vs Unhealthy Coping Skills  Participation Level:  Active   Description of Group The focus of this group was to determine what unhealthy coping techniques typically are used by group members and what healthy coping techniques would be helpful in coping with various problems. Patients were guided in becoming aware of the differences between healthy and unhealthy coping techniques. Patients were asked to identify 2-3 healthy coping skills they would like to learn to use more effectively.  Therapeutic Goals Patients learned that coping is what human beings do all day long to deal with various situations in their lives Patients defined and discussed healthy vs unhealthy coping techniques Patients identified their preferred coping techniques and identified whether these were healthy or unhealthy Patients determined 2-3 healthy coping skills they would like to become more familiar with and use more often. Patients provided support and ideas to each other   Summary of Patient Progress:  During group, Jeffery Baldwin expressed that exercise is one of his coping skills. Patient proved open to input from peers and feedback from CSW. Patient demonstrated insight into the subject matter, was respectful of peers, and participated throughout the entire session.   Therapeutic Modalities Cognitive Behavioral Therapy Motivational Interviewing  Aram Beecham, [place] 03/04/2021  3:07 PM

## 2021-03-04 NOTE — Progress Notes (Signed)
Adult Psychoeducational Group Note  Date:  03/04/2021 Time:  8:30 PM  Group Topic/Focus:  Wrap-Up Group:   The focus of this group is to help patients review their daily goal of treatment and discuss progress on daily workbooks.  Participation Level:  Active  Participation Quality:  Appropriate  Affect:  Appropriate  Cognitive:  Appropriate  Insight: Appropriate  Engagement in Group:  Engaged  Modes of Intervention:  Education  Additional Comments:  Patient attended and participated in group tonight. He reports that today he learnt some good coping skills.  Lita Mains [facility] 03/04/2021, 8:30 PM
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
## 2021-03-04 NOTE — [facility] Group Notes (Signed)
[facility] Group Notes:  (Nursing/MHT/Case Management/Adjunct)  Date:  03/04/2021  Time:  3:27 PM  Type of Therapy:   Orientation/Goal setting group  Participation Level:  Active  Participation Quality:  Appropriate  Affect:  Appropriate  Cognitive:  Appropriate  Insight:  Appropriate  Engagement in Group:  Engaged  Modes of Intervention:  Orientation  Summary of Progress/Problems: Pt goal for today is to continue to get his medication regulated so he can get back to being himself.  Farhana Fellows J Revan Gendron 03/04/2021, 3:27 PM

## 2021-03-04 NOTE — [facility] Group Notes (Signed)
Patient was not able to articulate his goal very well today. He stated he wants to regulate his medication but he does not want to take it. Explained to hem that medication management was important to his getting well by helping him deal with the issues that come up with his depression. Encouraged patient to talk with his health care team about his concerns with his medication.

## 2021-03-04 NOTE — BH IP Treatment Plan (Signed)
Interdisciplinary Treatment and Diagnostic Plan Update  03/04/2021 Time of Session: 9:55am  Jeffery Baldwin MRN: [id number]  Principal Diagnosis: Psychosis, unspecified psychosis type [facility])  Secondary Diagnoses: Principal Problem:   Psychosis, 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HCC) Active Problems:   Delusional disorder (HCC)   Anxious reaction   Chronic post-traumatic stress disorder (PTSD)   Marijuana use, continuous   Current Medications:  Current Facility-Administered Medications  Medication Dose Route Frequency Provider Last Rate Last Admin   acetaminophen (TYLENOL) tablet 650 mg  650 mg Oral Q6H PRN Jaclyn Shaggy, PA-C   650 mg at 03/02/21 2109   alum & mag hydroxide-simeth (MAALOX/MYLANTA) 200-200-20 MG/5ML suspension 30 mL  30 mL Oral Q4H PRN Melbourne Abts W, PA-C       diphenhydrAMINE (BENADRYL) capsule 50 mg  50 mg Oral Q6H PRN Mariel Craft, MD       Or   diphenhydrAMINE (BENADRYL) injection 50 mg  50 mg Intramuscular Q6H PRN Mariel Craft, MD       diphenhydrAMINE (BENADRYL) injection 50 mg  50 mg Intramuscular Q6H PRN Mariel Craft, MD       feeding supplement (BOOST / RESOURCE BREEZE) liquid 1 Container  1 Container Oral Q24H Comer Locket, MD   1 Container at 03/04/21 0936   feeding supplement (ENSURE ENLIVE / ENSURE PLUS) liquid 237 mL  237 mL Oral Q24H Mason Jim, Amy E, MD   237 mL at 03/02/21 2110   fluticasone (FLONASE) 50 MCG/ACT nasal spray 2 spray  2 spray Each Nare BID AC & HS Mariel Craft, MD   2 spray at 03/04/21 0758   gabapentin (NEURONTIN) capsule 400 mg  400 mg Oral TID Phineas Inches, MD   400 mg at 03/04/21 0755   loratadine (CLARITIN) tablet 10 mg  10 mg Oral QHS Mariel Craft, MD   10 mg at 03/03/21 2111   risperiDONE (RISPERDAL M-TABS) disintegrating tablet 1 mg  1 mg Oral Q8H PRN Claudie Revering, MD       And   LORazepam (ATIVAN) tablet 1 mg  1 mg Oral PRN Claudie Revering, MD       And   ziprasidone (GEODON) injection 20 mg  20 mg  Intramuscular PRN Claudie Revering, MD       magnesium hydroxide (MILK OF MAGNESIA) suspension 30 mL  30 mL Oral Daily PRN Melbourne Abts W, PA-C       montelukast (SINGULAIR) tablet 10 mg  10 mg Oral Theodora Blow, MD   10 mg at 03/04/21 0755   multivitamin with minerals tablet 1 tablet  1 tablet Oral Daily Claudie Revering, MD   1 tablet at 03/04/21 7673   nicotine polacrilex (NICORETTE) gum 2 mg  2 mg Oral PRN Jaclyn Shaggy, PA-C   2 mg at 03/04/21 1249   paliperidone (INVEGA SUSTENNA) injection 234 mg  234 mg Intramuscular Once Mariel Craft, MD       Followed by   Melene Muller ON 03/08/2021] paliperidone (INVEGA SUSTENNA) injection 156 mg  156 mg Intramuscular Once Mariel Craft, MD       Followed by   Melene Muller ON 04/05/2021] paliperidone (INVEGA SUSTENNA) injection 234 mg  234 mg Intramuscular Once Mariel Craft, MD       risperiDONE (RISPERDAL M-TABS) disintegrating tablet 2 mg  2 mg Oral QHS Mariel Craft, MD       [START ON 03/05/2021] sertraline (ZOLOFT) tablet 75 mg  75 mg Oral Daily Mariel Craft, MD       Followed by   Melene Muller ON 03/06/2021] sertraline (ZOLOFT) tablet 100 mg  100 mg Oral Daily Mariel Craft, MD       thiamine tablet 100 mg  100 mg Oral Daily Claudie Revering, MD   100 mg at 03/04/21 6269   PTA Medications: Medications Prior to Admission  Medication Sig Dispense Refill Last Dose   busPIRone (BUSPAR) 10 MG tablet Take 1 tablet (10 mg total) by mouth 3 (three) times daily. (Patient taking differently: Take 10 mg by mouth at bedtime.) 90 tablet 2 Past Month    Patient Stressors: Marital or family conflict    Patient Strengths: Motivation for treatment/growth   Treatment Modalities: Medication Management, Group therapy, Case management,  1 to 1 session with clinician, Psychoeducation, Recreational therapy.   Physician Treatment Plan for Primary Diagnosis: Psychosis, 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HCC) Long Term Goal(s): Improvement in symptoms so as  ready for discharge   Short Term Goals: Ability to identify changes in lifestyle to reduce recurrence of condition will improve Ability to verbalize feelings will improve Ability to disclose and discuss suicidal ideas Ability to demonstrate self-control will improve Ability to identify and develop effective coping behaviors will improve Ability to maintain clinical measurements within normal limits will improve Compliance with prescribed medications will improve Ability to identify triggers associated with substance abuse/mental health issues will improve  Medication Management: Evaluate patient's response, side effects, and tolerance of medication regimen.  Therapeutic Interventions: 1 to 1 sessions, Unit Group sessions and Medication administration.  Evaluation of Outcomes: Progressing  Physician Treatment Plan for Secondary Diagnosis: Principal Problem:   Psychosis, 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HCC) Active Problems:   Delusional disorder (HCC)   Anxious reaction   Chronic post-traumatic stress disorder (PTSD)   Marijuana use, continuous  Long Term Goal(s): Improvement in symptoms so as ready for discharge   Short Term Goals: Ability to identify changes in lifestyle to reduce recurrence of condition will improve Ability to verbalize feelings will improve Ability to disclose and discuss suicidal ideas Ability to demonstrate self-control will improve Ability to identify and develop effective coping behaviors will improve Ability to maintain clinical measurements within normal limits will improve Compliance with prescribed medications will improve Ability to identify triggers associated with substance abuse/mental health issues will improve     Medication Management: Evaluate patient's response, side effects, and tolerance of medication regimen.  Therapeutic Interventions: 1 to 1 sessions, Unit Group sessions and Medication administration.  Evaluation of Outcomes:  Progressing   RN Treatment Plan for Primary Diagnosis: Psychosis, 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HCC) Long Term Goal(s): Knowledge of disease and therapeutic regimen to maintain health will improve  Short Term Goals: Ability to remain free from injury will improve, Ability to participate in decision making will improve, Ability to verbalize feelings will improve, Ability to disclose and discuss suicidal ideas, and Ability to identify and develop effective coping behaviors will improve  Medication Management: RN will administer medications as ordered by provider, will assess and evaluate patient's response and provide education to patient for prescribed medication. RN will report any adverse and/or side effects to prescribing provider.  Therapeutic Interventions: 1 on 1 counseling sessions, Psychoeducation, Medication administration, Evaluate responses to treatment, Monitor vital signs and CBGs as ordered, Perform/monitor CIWA, COWS, AIMS and Fall Risk screenings as ordered, Perform wound care treatments as ordered.  Evaluation of Outcomes: Progressing   LCSW Treatment Plan for Primary Diagnosis: Psychosis, unspecified psychosis  type [facility]) Long Term Goal(s): Safe transition to appropriate next level of care at discharge, Engage patient in therapeutic group addressing interpersonal concerns.  Short Term Goals: Engage patient in aftercare planning with referrals and resources, Increase social support, Increase emotional regulation, Facilitate acceptance of mental health diagnosis and concerns, Identify triggers associated with mental health/substance abuse issues, and Increase skills for wellness and recovery  Therapeutic Interventions: Assess for all discharge needs, 1 to 1 time with Social worker, Explore available resources and support systems, Assess for adequacy in community support network, Educate family and significant other(s) on suicide prevention, Complete Psychosocial Assessment,  Interpersonal group therapy.  Evaluation of Outcomes: Progressing   Progress in Treatment: Attending groups: Yes. Participating in groups: Yes. Taking medication as prescribed: Yes. Toleration medication: Yes. Family/Significant other contact made: Yes, individual(s) contacted:  Father  Patient understands diagnosis: Yes. and No. Discussing patient identified problems/goals with staff: Yes. Medical problems stabilized or resolved: Yes. Denies suicidal/homicidal ideation: Yes. Issues/concerns per patient self-inventory: No.   New problem(s) identified: No, Describe:  None   New Short Term/Long Term Goal(s): medication stabilization, elimination of SI thoughts, development of comprehensive mental wellness plan.   Patient Goals: Did not attend/Update   Discharge Plan or Barriers: Pt will go to a shelter and will attend [facility] for therapy and psychiatry   Reason for Continuation of Hospitalization: Anxiety Depression Medication stabilization  Estimated Length of Stay: 3 to 5 days    Scribe for Treatment Team: Aram Beecham, Theresia Majors 03/04/2021 2:52 PM

## 2021-03-05 DIAGNOSIS — F29 Unspecified psychosis not due to a substance or known physiological condition: Secondary | ICD-10-CM | POA: Diagnosis not present

## 2021-03-05 LAB — VITAMIN B12: Vitamin B-12: 308 pg/mL (ref 180–914)

## 2021-03-05 NOTE — Progress Notes (Addendum)
[facility] MD Progress Note  03/05/2021 1:00 PM KINTE TRIM  MRN:  [id number] Subjective: Overnight there were no behavior concerns regarding patient.  Patient remains compliant with his medication and received his first dose of [place] yesterday afternoon and denies any adverse effects.  On assessment this a.m. patient reports that he feels like this LAI has helped him feel "more leveled out."  Patient reports that he feels less sedated but slept "fair" last night.  Patient reports that his appetite is overall "okay."  On assessment patient denies SI, HI and AVH.  Provider and patient discussed patient's referencing his friends working for [employer].  Patient reports that he does not recall ever talking about "friends" but he does know that he has a cousin who works for the [place].  Patient reports he is not bothered by his cousin working for the [employer] and does not believe that this gives him any increased risk of being monitored by government agencies.  Patient reports during assessment that he feels "like I have done this before.  I feel like I am playing a role in a movie."  He describes deja vu feeling. Patient reports that he still does not feel like everything makes sense and denies believing that his mother is actually dead.  Patient reports that today is actually his mother's birthday.  During assessment patient reports he is a bit overwhelmed.  Patient reports that he does not think he is having bizarre thoughts other than feeling that people might be putting things into his brain or taking thoughts out of his brain.  Patient reports that the assessment is making his brain feel tired and while talking with patient about his hospitalization so far and symptoms he has presented with patient can be heard muttering "fuck" under his breath to himself. Provider attempted multiple times to make sure patient felt comfortable during assessment.  Patient denied feeling uncomfortable on the unit but  reported that "outside of here is another thing."  Principal Problem: Psychosis, 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[place]) Diagnosis: Principal Problem:   Psychosis, 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[place]) Active Problems:   Delusional disorder ([place])   Anxious reaction   Chronic post-traumatic stress disorder (PTSD)   Marijuana use, continuous  Total Time Spent in Direct Patient Care:  I personally spent 30 minutes on the unit in direct patient care. The direct patient care time included face-to-face time with the patient, reviewing the patient's chart, communicating with other professionals, and coordinating care. Greater than 50% of this time was spent in counseling or coordinating care with the patient regarding goals of hospitalization, psycho-education, and discharge planning needs.  Past Psychiatric History: See H&P  Past Medical History:  Past Medical History:  Diagnosis Date   Chronic pain syndrome    Heroin abuse ([place])    Nerve root avulsion    Spinal cord injury at C5-C7 level without injury of spinal bone ([place]) 2011    Past Surgical History:  Procedure Laterality Date   nerve root repair     Family History: History reviewed. No pertinent family history. Family Psychiatric  History: See H&P Social History:  Social History   Substance and Sexual Activity  Alcohol Use Yes   Alcohol/week: 0.0 standard drinks   Comment: occasional     Social History   Substance and Sexual Activity  Drug Use Yes   Comment: heroine    Social History   Socioeconomic History   Marital status: Single    Spouse name: Not on file  Number of children: Not on file   Years of education: Not on file   Highest education level: Not on file  Occupational History   Not on file  Tobacco Use   Smoking status: Some Days    Packs/day: 0.20    Years: 7.00    Pack years: 1.40    Types: Cigarettes   Smokeless tobacco: Never   Tobacco comments:    decreased smoking  Substance and Sexual Activity    Alcohol use: Yes    Alcohol/week: 0.0 standard drinks    Comment: occasional   Drug use: Yes    Comment: heroine   Sexual activity: Yes  Other Topics Concern   Not on file  Social History Narrative   ** Merged History Encounter **       Social Determinants of Health   Financial Resource Strain: Not on file  Food Insecurity: Not on file  Transportation Needs: Not on file  Physical Activity: Not on file  Stress: Not on file  Social Connections: Not on file   Sleep: Fair  Appetite:  Good  Current Medications: Current Facility-Administered Medications  Medication Dose Route Frequency Provider Last Rate Last Admin   acetaminophen (TYLENOL) tablet 650 mg  650 mg Oral Q6H PRN Prescilla Sours, PA-C   650 mg at 03/02/21 2109   alum & mag hydroxide-simeth (MAALOX/MYLANTA) 200-200-20 MG/5ML suspension 30 mL  30 mL Oral Q4H PRN Margorie John W, PA-C       diphenhydrAMINE (BENADRYL) capsule 50 mg  50 mg Oral Q6H PRN Lavella Hammock, MD       Or   diphenhydrAMINE (BENADRYL) injection 50 mg  50 mg Intramuscular Q6H PRN Lavella Hammock, MD       diphenhydrAMINE (BENADRYL) injection 50 mg  50 mg Intramuscular Q6H PRN Lavella Hammock, MD       feeding supplement (BOOST / RESOURCE BREEZE) liquid 1 Container  1 Container Oral Q24H Harlow Asa, MD   1 Container at 03/05/21 0936   feeding supplement (ENSURE ENLIVE / ENSURE PLUS) liquid 237 mL  237 mL Oral Q24H Nelda Marseille, Sammi Stolarz E, MD   237 mL at 03/04/21 2039   fluticasone (FLONASE) 50 MCG/ACT nasal spray 2 spray  2 spray Each Nare BID AC & HS Lavella Hammock, MD   2 spray at 03/05/21 0830   gabapentin (NEURONTIN) capsule 400 mg  400 mg Oral TID Janine Limbo, MD   400 mg at 03/05/21 0831   loratadine (CLARITIN) tablet 10 mg  10 mg Oral QHS Lavella Hammock, MD   10 mg at 03/04/21 2042   risperiDONE (RISPERDAL M-TABS) disintegrating tablet 1 mg  1 mg Oral Q8H PRN Arthor Captain, MD       And   LORazepam (ATIVAN) tablet 1 mg  1 mg Oral  PRN Arthor Captain, MD       And   ziprasidone (GEODON) injection 20 mg  20 mg Intramuscular PRN Arthor Captain, MD       magnesium hydroxide (MILK OF MAGNESIA) suspension 30 mL  30 mL Oral Daily PRN Margorie John W, PA-C       montelukast (SINGULAIR) tablet 10 mg  10 mg Oral Doroteo Bradford, MD   10 mg at 03/05/21 0830   multivitamin with minerals tablet 1 tablet  1 tablet Oral Daily Arthor Captain, MD   1 tablet at 03/05/21 0830   nicotine polacrilex (NICORETTE) gum 2 mg  2  mg Oral PRN Prescilla Sours, PA-C   2 mg at 03/04/21 1750   [START ON 03/08/2021] paliperidone (INVEGA SUSTENNA) injection 156 mg  156 mg Intramuscular Once Lavella Hammock, MD       Followed by   Derrill Memo ON 04/05/2021] paliperidone (INVEGA SUSTENNA) injection 234 mg  234 mg Intramuscular Once Lavella Hammock, MD       risperiDONE (RISPERDAL M-TABS) disintegrating tablet 2 mg  2 mg Oral QHS Lavella Hammock, MD   2 mg at 03/04/21 2046   [START ON 03/06/2021] sertraline (ZOLOFT) tablet 100 mg  100 mg Oral Daily Lavella Hammock, MD       thiamine tablet 100 mg  100 mg Oral Daily Arthor Captain, MD   100 mg at 03/05/21 0830    Lab Results: No results found for this or any previous visit (from the past 68 hour(s)).  Blood Alcohol level:  Lab Results  Component Value Date   ETH <10 02/22/2021   ETH <10 80/08/4915    Metabolic Disorder Labs: Lab Results  Component Value Date   HGBA1C 4.9 02/22/2021   MPG 93.93 02/22/2021   No results found for: PROLACTIN Lab Results  Component Value Date   CHOL 166 02/22/2021   TRIG 42 02/22/2021   HDL 93 02/22/2021   CHOLHDL 1.8 02/22/2021   VLDL 8 02/22/2021   LDLCALC 65 02/22/2021    Physical Findings: AIMS: Facial and Oral Movements Muscles of Facial Expression: None, normal Lips and Perioral Area: None, normal Jaw: None, normal Tongue: None, normal,Extremity Movements Upper (arms, wrists, hands, fingers): None, normal Lower (legs, knees, ankles, toes):  None, normal, Trunk Movements Neck, shoulders, hips: None, normal, Overall Severity Severity of abnormal movements (highest score from questions above): None, normal Incapacitation due to abnormal movements: None, normal Patient's awareness of abnormal movements (rate only patient's report): No Awareness, Dental Status Current problems with teeth and/or dentures?: No Does patient usually wear dentures?: No  CIWA:  CIWA-Ar Total: 0     Musculoskeletal: Strength & Muscle Tone:  Baseline Gait & Station: normal Patient leans: N/A  Psychiatric Specialty Exam:  Presentation  General Appearance: casually dressed, adequate hygiene  Eye Contact:Good  Speech:Clear and Coherent  Speech Volume:Decreased  Handedness:-- (Not assessed)   Mood and Affect  Mood:-- ("I feel more leveled.") - appears anxious, aloof and guarded  Affect:restricted, guarded  Thought Process  Thought Processes:goal directed but ruminative about his mother and her birthday  Orientation:Full (Time, Place and Person)  Thought Content: Delusions; Paranoid Ideation, belief in thought insertion/withdrawal; denies ideas of reference or thought broadcasting; is not grossly responding to internal/external stimuli on exam but appears guarded; Denies AVH  History of Schizophrenia/Schizoaffective disorder:No  Duration of Psychotic Symptoms:Greater than six months  Hallucinations:Hallucinations: None  Ideas of Reference: Denied  Suicidal Thoughts:Suicidal Thoughts: No  Homicidal Thoughts:Homicidal Thoughts: No   Sensorium  Memory:Immediate Good; Recent Fair; Remote [place]   Executive Functions  Concentration:Fair  Attention Span:Fair  [place] of Knowledge:Good  Language:Good   Psychomotor Activity  Psychomotor Activity:Psychomotor Activity: Normal   Assets  Assets:Desire for Improvement; Resilience   Sleep  Sleep: 6.75 hours  Physical  Exam Constitutional:      Appearance: Normal appearance.  HENT:     Head: Normocephalic and atraumatic.  Pulmonary:     Effort: Pulmonary effort is normal.  Neurological:     Mental Status: He is alert and oriented to person, place, and time.  Review of Systems  Respiratory:  Negative for shortness of breath.   Cardiovascular:  Negative for chest pain.  Gastrointestinal:  Negative for constipation, diarrhea, nausea and vomiting.  Blood pressure 124/79, pulse 63, temperature 97.8 F (36.6 C), temperature source Oral, resp. rate 16, height 5' 11"  (1.803 m), weight 64.6 kg, SpO2 100 %. Body mass index is 19.87 kg/m.   Treatment Plan Summary: Daily contact with patient to assess and evaluate symptoms and progress in treatment and Medication management  The patient is a 31 year old male with prior diagnoses of ADHD, anxiety, MDD, substance abuse and history of paranoia who was admitted with worsening psychotic symptoms and suicidal ideation in the context of substance use (THC, Kratom, tramadol, Klonopin).  Patient continues to endorse delusions and paranoia regarding not believing his mother is truly dead as well as feelings of dj vu.  Patient also continues to have some issues with his thought processing.  Patient does continue to do well interacting on the unit with no behavior concerns and is attending group therapy.  Patient endorses feeling some positive side effects from recently getting his LAI and a decrease in feeling overly sedated.  Unspecified schizophrenia spectrum and other psychotic d/o  (r/o schizophrenia, r/o delusional d/o, r/o MDD with psychosis, r/o schizoaffective d/o, r/o substance induce psychosis) - Invega Sustenna 225m IM (03/04/2021), then 1539mIM on 10/4, and 23443mM on 11/1 -Continue Risperdal to 2mg54ms.  Delusions appear to be stabilizing. Previously held delusions, especially longstanding ones, may remain as part of encoded memory regardless of therapeutic  antipsychotic level.  - Continue Diphenhydramine 50mg12mq6h PRN for acute dystonia. No EPS on exam First psychotic break additional labs reviewed: Vitamin D-normal, iron and TIBC-WNL, ferritin-WNL, ammonia-WNL, ESR-WNL, ANA-WNL, RPR-negative, HIV-nonreactive Heavy metal, ceruloplasmin, copper, B1, B12-pending, Head CT noncontrast- negative Additional labs reviewed: HgbA1c-4.9, TSH-0.503, lipid panel-WNL, CBC-WNL W/exception Hgb 17.5 (elevated Hgb can be seen with nicotine use), CMP-ALT elevated (58), albumin elevated (5.3), hepatitis panel-HCV antibody reactive,EtOH-WNL  PTSD -Continue Zoloft to 75mg,27men increase to 100mg d65m starting Sunday - for PTSD, anxiety and mood symptoms. Patient made aware that this may take 6-8 weeks to be effective.  He can work with outpatient psychiatrist for medication adjustments after discharge.   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 (Tramadol, Roxicodone) - Continue Lorazepam 1mg PRN73mr CIWA>10 for benzodiazepine withdrawal. Most recent CIWA was 0 on 10/1 - Continue Nicorette gum 2mg - Co74mnue thiamine 100mg dail21mContinue multivitamin with minerals 1 tablet daily - Substance use treatment possible after discharge   HCV - HCV RNA indicates current infection (332,000 IU/mL) - GI consult and followup as an outpatient   Nerve pain (RUE) - Continue gabapentin 400mg TID  91mergic rhinitis - Continue fluticasone nasal spray 50mcg BID -87mtinue Singulair 10mg PO dail43mContinue Claritin 10mg qhs   Di94m Regular diet - Boost feeding supplement daily - Ensure feeding supplement 237ml daily   D64m - Discussed shelter housing with patient. SW gave resources, patient to call -Patient will need resources for hepatitis C treatment as outpatient.   PGY-2 Jai B McQuilla,Freida Busman 1:00 PM

## 2021-03-05 NOTE — Progress Notes (Signed)
Pt did not attend psycho-ed group. 

## 2021-03-05 NOTE — Progress Notes (Signed)
Adult Psychoeducational Group Note  Date:  03/05/2021 Time:  9:34 PM  Group Topic/Focus:  Wrap-Up Group:   The focus of this group is to help patients review their daily goal of treatment and discuss progress on daily workbooks.  Participation Level:  Active  Participation Quality:  Appropriate  Affect:  Appropriate  Cognitive:  Appropriate  Insight: Appropriate  Engagement in Group:  Developing/Improving  Modes of Intervention:  Discussion  Additional Comments:  Pt stated his goal for today was to focus on his treatment plan. Pt stated he accomplished his goal today. Pt stated he talked with his doctor but did not get a chance to talk with his social worker about his care today. Pt rated his overall day a 5 out of 10. Pt stated he made no calls today. Pt stated he felt better about himself today. Pt stated he was able to attend all meals. Pt stated he took all medications provided today. Pt stated he attend all groups held today. Pt stated his appetite was pretty good today. Pt rated sleep last night was fair. Pt stated the goal tonight was to get some rest. Pt stated he had some physical pain tonight. Pt stated he had some moderate pain in his right arm tonight. Pt rated the moderate pain in his right arm a 5 on the pain level scale. Pt nurse was updated on the situation. Pt deny visual hallucinations and auditory issues tonight. Pt denies thoughts of harming himself or others.   Pt stated he would alert staff if anything changed.  Felipa Furnace 03/05/2021, 9:34 PM

## 2021-03-05 NOTE — [facility] Group Notes (Signed)
[facility] Group Notes:  (Nursing/MHT/Case Management/Adjunct)  Date:  03/05/2021  Time:  11:15 AM  Type of Therapy:   Orientation/Goal setting group  Participation Level:  Active  Participation Quality:  Appropriate  Affect:  Appropriate  Cognitive:  Appropriate  Insight:  Appropriate  Engagement in Group:  Engaged  Modes of Intervention:  Orientation  Summary of Progress/Problems: Pt goal for today is to stay positive.   Jacy Brocker J Darry Kelnhofer 03/05/2021, 11:15 AM

## 2021-03-05 NOTE — Group Note (Deleted)
LCSW Group Therapy Note  Group Date: 03/05/2021 Start Time: 1015 End Time: 1115   Type of Therapy and Topic:  Group Therapy: Anger Cues and Responses  Participation Level:  {[facility] PARTICIPATION LEVEL:22264}   Description of Group:   In this group, patients learned how to recognize the physical, cognitive, emotional, and behavioral responses they have to anger-provoking situations.  They identified a recent time they became angry and how they reacted.  They analyzed how their reaction was possibly beneficial and how it was possibly unhelpful.  The group discussed a variety of healthier coping skills that could help with such a situation in the future.  Focus was placed on how helpful it is to recognize the underlying emotions to our anger, because working on those can lead to a more permanent solution as well as our ability to focus on the important rather than the urgent.  Therapeutic Goals: 1. Patients will remember their last incident of anger and how they felt emotionally and physically, what their thoughts were at the time, and how they behaved. 2. Patients will identify how their behavior at that time worked for them, as well as how it worked against them. 3. Patients will explore possible new behaviors to use in future anger situations. 4. Patients will learn that anger itself is normal and cannot be eliminated, and that healthier reactions can assist with resolving conflict rather than worsening situations.  Summary of Patient Progress:  *** was active during the group. ***he shared a recent occurrence wherein feeling *** led to anger. ***he demonstrated *** insight into the subject matter, was respectful of peers, and participated throughout the entire session.  Therapeutic Modalities:   Cognitive Behavioral Therapy    Taijuan Serviss J Grossman-Orr, LCSWA 03/05/2021  12:13 PM    

## 2021-03-05 NOTE — Plan of Care (Signed)
Patient stayed active in the milieu until bedtime. Presented to the medication w[place]ow, pleasant and cooperative. Denies SI/HI.  Admitted to hearing voices and added "I believe in spirituality, I don't know why doctors make it a big deal". Received medications and went to bed. Has been sleeping.

## 2021-03-05 NOTE — Group Note (Signed)
LCSW Group Therapy Note 03/05/2021  10:15am-11:15am  Type of Therapy and Topic:  Group Therapy: Anger and Commonalities  Participation Level:  Active   Description of Group: In this group, patients initially identified something that consistently makes them angry.  In doing this, they were able to see they have much in common with other patients.  We discussed possible underlying emotions that lead to this anger, which was explained to be the secondary emotion.  We also discussed cognitions that might lead correctly or incorrectly to underlying feelings that then lead to anger.  Many examples were shared by group members.  Commonalities among group members were pointed out throughout the entirety of group.  Also pointed out numerous times is that the targets of a person's anger also have underlying cognitions and emotions that could explain their reactions/anger.  Therapeutic Goals: Patients will be able to identify one thing that consistently angers them and why. Patients will understand the commonalities they have with others in terms of their anger triggers.   Patients will learn that anger itself is a secondary emotion and will think about possible primary emotions that could influence their anger. Patients will learn that cognitions can be correct or incorrect and often lead to the emotions that then lead to anger.     Summary of Patient Progress:  The patient shared that one of his/her causes of anger is "seeing people taken advantage of."  The patient did not talk much in group, was in and out several times.  At one point the lady sitting next to him was laughing and he joined her, but this was not disruptive.  Therapeutic Modalities:   Cognitive Behavioral Therapy  Lynnell Chad, LCSW

## 2021-03-06 DIAGNOSIS — F29 Unspecified psychosis not due to a substance or known physiological condition: Secondary | ICD-10-CM | POA: Diagnosis not present

## 2021-03-06 NOTE — [facility] Group Notes (Signed)
[facility] Group Notes:  (Nursing/MHT/Case Management/Adjunct)  Date:  03/06/2021  Time:  6:03 PM  Type of Therapy:  Psychoeducational Skills  Participation Level:  Active  Participation Quality:  Appropriate  Affect:  Appropriate  Cognitive:  Appropriate  Insight:  Appropriate  Engagement in Group:  Engaged  Modes of Intervention:  Education  Summary of Progress/Problems: Topic was healthy support systems. He has loving family and friends that play a big supporting role in his life.   Kamesha Herne J Laquana Villari 03/06/2021, 6:03 PM

## 2021-03-06 NOTE — Progress Notes (Signed)
Pt didn't attend orientation/goal setting group. 

## 2021-03-06 NOTE — Progress Notes (Signed)
Patient rated his day as a 5 out of 10. His goal for tomorrow is to work on his discharge plan.

## 2021-03-06 NOTE — Progress Notes (Signed)
Pt presents with bright affect, anxious mood and remains suspicious but pleasant on interactions. Off unit with peers for leisure activities. Observed in scheduled groups, engaged in activities without issues. Denies SI, HI, AVH and pain when assessed. Reports he slept well last night with good appetite, good concentration and low energy level. Rates his depression and hopelessness both 6/10 and anxiety 5/10 with current stressor being ruminating about his relationship with his mother, discharge needs related to his current treatment regimen "I just don't want to keep taking that pill at night especially when I leave here". Tolerates all PO intake well and remains compliant with medications when offered. Verbal education provided on all medications, overlapping use of both his Risperdal M-tab at night and the [place] to promote compliance. and effects monitored. Pt Q 15 minutes safety checks maintained without self harm gestures or outburst to note at this time. Support, encouragement and reassurance provided to pt throughout this shift.  Pt remains safe on and off unit. Pt tolerates all meals and fluids well when offered. Denies  concerns at this time

## 2021-03-06 NOTE — Plan of Care (Signed)
Patient stayed in the milieu until bedtime. Attended groups and participated in various activities. Alert and oriented x 4. Denied SI/HI/AVH. Reported that his day did not go very well because it was his deceased mother's birthday "I wish I had listened to her..I  miss her every day". Patient received bedtime medications and had no additional concerns. Currently in bed asleep. No sign of distress.

## 2021-03-06 NOTE — Progress Notes (Addendum)
[facility] MD Progress Note  03/06/2021 1:32 PM Jeffery Baldwin  MRN:  [id number]  Subjective: Overnight patient had no behavior concerns and continues to interact well with other patients in the milieu.  Patient reports that he is not not very sure how he is feeling today.  Initially patient reports that he is "better than yesterday because it was my mom's birthday yesterday" but then he denies that he is feeling better.  Patient reports that he feels that there are "too many coincidences."  Patient denies magical thinking but continues to endorse thought insertion/withdrawal from an unknown entity and belief in thought broadcasting.  Patient denies SI, HI and AVH and denies ideas of reference. Patient reports that he still does not believe his mother is truly dead despite report that she died of a fentanyl overdose. Patient reports that he does feel safe on the unit and denies paranoia on the unit. Patient also reports that he is not sure he wants to take his Zoloft anymore because "I just do not like antidepressants".  Attending provider explained to patient that this medication was being prescribed to help with his trauma symptoms and ruminative  thoughts.  Patient decided he would continue with the medication but would not like to continue upward titration.  Patient reports that he is sleeping "all right" and his appetite is "fine."  He voices no physical complaints and per MAR has been medication compliant without need for PRNs for behavior or agitation.  Principal Problem: Psychosis, 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HCC) Diagnosis: Principal Problem:   Psychosis, 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HCC) Active Problems:   Delusional disorder (HCC)   Anxious reaction   Chronic post-traumatic stress disorder (PTSD)   Marijuana use, continuous  Total Time Spent in Direct Patient Care:  I personally spent 30 minutes on the unit in direct patient care. The direct patient care time included face-to-face time with the  patient, reviewing the patient's chart, communicating with other professionals, and coordinating care. Greater than 50% of this time was spent in counseling or coordinating care with the patient regarding goals of hospitalization, psycho-education, and discharge planning needs.  Past Psychiatric History: See H&P  Past Medical History:  Past Medical History:  Diagnosis Date   Chronic pain syndrome    Heroin abuse (HCC)    Nerve root avulsion    Spinal cord injury at C5-C7 level without injury of spinal bone (HCC) 2011    Past Surgical History:  Procedure Laterality Date   nerve root repair     Family History: see H&P  Family Psychiatric  History: See H&P  Social History:  Social History   Substance and Sexual Activity  Alcohol Use Yes   Alcohol/week: 0.0 standard drinks   Comment: occasional     Social History   Substance and Sexual Activity  Drug Use Yes   Comment: heroine    Social History   Socioeconomic History   Marital status: Single    Spouse name: Not on file   Number of children: Not on file   Years of education: Not on file   Highest education level: Not on file  Occupational History   Not on file  Tobacco Use   Smoking status: Some Days    Packs/day: 0.20    Years: 7.00    Pack years: 1.40    Types: Cigarettes   Smokeless tobacco: Never   Tobacco comments:    decreased smoking  Substance and Sexual Activity   Alcohol use: Yes      Alcohol/week: 0.0 standard drinks    Comment: occasional   Drug use: Yes    Comment: heroine   Sexual activity: Yes  Other Topics Concern   Not on file  Social History Narrative   ** Merged History Encounter **       Social Determinants of Health   Financial Resource Strain: Not on file  Food Insecurity: Not on file  Transportation Needs: Not on file  Physical Activity: Not on file  Stress: Not on file  Social Connections: Not on file   Sleep: Fair  Appetite:  Fair  Current Medications: Current  Facility-Administered Medications  Medication Dose Route Frequency Provider Last Rate Last Admin   acetaminophen (TYLENOL) tablet 650 mg  650 mg Oral Q6H PRN Taylor, Cody W, PA-C   650 mg at 03/02/21 2109   alum & mag hydroxide-simeth (MAALOX/MYLANTA) 200-200-20 MG/5ML suspension 30 mL  30 mL Oral Q4H PRN Taylor, Cody W, PA-C       diphenhydrAMINE (BENADRYL) capsule 50 mg  50 mg Oral Q6H PRN Maurer, Sheila M, MD       Or   diphenhydrAMINE (BENADRYL) injection 50 mg  50 mg Intramuscular Q6H PRN Maurer, Sheila M, MD       diphenhydrAMINE (BENADRYL) injection 50 mg  50 mg Intramuscular Q6H PRN Maurer, Sheila M, MD       feeding supplement (BOOST / RESOURCE BREEZE) liquid 1 Container  1 Container Oral Q24H Jamirah Zelaya E, MD   1 Container at 03/06/21 0934   feeding supplement (ENSURE ENLIVE / ENSURE PLUS) liquid 237 mL  237 mL Oral Q24H Alem Fahl E, MD   237 mL at 03/05/21 2011   fluticasone (FLONASE) 50 MCG/ACT nasal spray 2 spray  2 spray Each Nare BID AC & HS Maurer, Sheila M, MD   2 spray at 03/06/21 0827   gabapentin (NEURONTIN) capsule 400 mg  400 mg Oral TID Massengill, Nathan, MD   400 mg at 03/06/21 1307   loratadine (CLARITIN) tablet 10 mg  10 mg Oral QHS Maurer, Sheila M, MD   10 mg at 03/05/21 2014   risperiDONE (RISPERDAL M-TABS) disintegrating tablet 1 mg  1 mg Oral Q8H PRN James, Martha L, MD       And   LORazepam (ATIVAN) tablet 1 mg  1 mg Oral PRN James, Martha L, MD       And   ziprasidone (GEODON) injection 20 mg  20 mg Intramuscular PRN James, Martha L, MD       magnesium hydroxide (MILK OF MAGNESIA) suspension 30 mL  30 mL Oral Daily PRN Taylor, Cody W, PA-C       montelukast (SINGULAIR) tablet 10 mg  10 mg Oral BH-q7a Maurer, Sheila M, MD   10 mg at 03/06/21 0828   multivitamin with minerals tablet 1 tablet  1 tablet Oral Daily James, Martha L, MD   1 tablet at 03/06/21 0828   nicotine polacrilex (NICORETTE) gum 2 mg  2 mg Oral PRN Taylor, Cody W, PA-C   2 mg at 03/06/21  1311   [START ON 03/08/2021] paliperidone (INVEGA SUSTENNA) injection 156 mg  156 mg Intramuscular Once Maurer, Sheila M, MD       Followed by   [START ON 04/05/2021] paliperidone (INVEGA SUSTENNA) injection 234 mg  234 mg Intramuscular Once Maurer, Sheila M, MD       risperiDONE (RISPERDAL M-TABS) disintegrating tablet 2 mg  2 mg Oral QHS Maurer, Sheila M, MD   2   mg at 03/05/21 2013   sertraline (ZOLOFT) tablet 100 mg  100 mg Oral Daily Lavella Hammock, MD   100 mg at 03/06/21 3500   thiamine tablet 100 mg  100 mg Oral Daily Arthor Captain, MD   100 mg at 03/06/21 9381    Lab Results:  Results for orders placed or performed during the hospital encounter of 02/23/21 (from the past 48 hour(s))  Vitamin B12     Status: None   Collection Time: 03/05/21  6:26 PM  Result Value Ref Range   Vitamin B-12 308 180 - 914 pg/mL    Comment: (NOTE) This assay is not validated for testing neonatal or myeloproliferative syndrome specimens for Vitamin B12 levels. Performed at [facility], [place] [address], [place], [place] [address]     Blood Alcohol level:  Lab Results  Component Value Date   ETH <10 02/22/2021   ETH <10 71/69/6789    Metabolic Disorder Labs: Lab Results  Component Value Date   HGBA1C 4.9 02/22/2021   MPG 93.93 02/22/2021   No results found for: PROLACTIN Lab Results  Component Value Date   CHOL 166 02/22/2021   TRIG 42 02/22/2021   HDL 93 02/22/2021   CHOLHDL 1.8 02/22/2021   VLDL 8 02/22/2021   LDLCALC 65 02/22/2021    Physical Findings: AIMS: Facial and Oral Movements Muscles of Facial Expression: None, normal Lips and Perioral Area: None, normal Jaw: None, normal Tongue: None, normal,Extremity Movements Upper (arms, wrists, hands, fingers): None, normal Lower (legs, knees, ankles, toes): None, normal, Trunk Movements Neck, shoulders, hips: None, normal, Overall Severity Severity of abnormal movements (highest score from questions above):  None, normal Incapacitation due to abnormal movements: None, normal Patient's awareness of abnormal movements (rate only patient's report): No Awareness, Dental Status Current problems with teeth and/or dentures?: No Does patient usually wear dentures?: No  CIWA:  CIWA-Ar Total: 0    Musculoskeletal: Strength & Muscle Tone: within normal limits for patient, right arm remains flexed at the elbow Gait & Station: normal, steady Patient leans: N/A  Psychiatric Specialty Exam:  Presentation  General Appearance: casually dressed, adequate hygiene  Eye Contact:Fair  Speech:Clear and Coherent  Speech Volume:Decreased  Handedness:-- (Not assessed)   Mood and Affect  Mood:Anxious ("I don't know. I'm still not sure of stuff."), aloof  Affect:guarded, constricted  Thought Process  Thought Processes:Tangential and circumstantial  Orientation:Full (Time, Place and Person)  Thought Content: He appears paranoid on exam with eyes often wandering around as if looking past things but is not grossly responding to internal/external stimuli during assessment; he has residual delusion that his mother is not deceased which appear fixed in nature; he denies AVH, ideas of reference or magical thinking; he does endorse residual thought broadcasting/insertion/withdrawal  History of Schizophrenia/Schizoaffective disorder:No  Duration of Psychotic Symptoms:Greater than six months  Hallucinations:Denied  Ideas of Reference:Denied  Suicidal Thoughts:Suicidal Thoughts: No  Homicidal Thoughts:Homicidal Thoughts: No   Sensorium  Memory:Immediate Good; Recent Fair; Remote Fair  Judgment:Fair - complying with meds  Insight:Shallow   Executive Functions  Concentration:Fair  Attention Span:Fair  [place]   Psychomotor Activity  Psychomotor Activity:Psychomotor Activity: Normal   Assets  Assets:Resilience   Sleep  Sleep:6.75  hours  Physical Exam HENT:     Head: Normocephalic and atraumatic.  Pulmonary:     Effort: Pulmonary effort is normal.  Musculoskeletal:     Comments: ROM baseline for patient  Neurological:  Mental Status: He is alert and oriented to person, place, and time.   Review of Systems  Respiratory:  Negative for shortness of breath.   Cardiovascular:  Negative for chest pain.  Gastrointestinal:  Negative for constipation, diarrhea, nausea and vomiting.  Psychiatric/Behavioral:  Negative for hallucinations and suicidal ideas.   Blood pressure 118/78, pulse 83, temperature 97.8 F (36.6 C), temperature source Oral, resp. rate 16, height 5' 11" (1.803 m), weight 64.6 kg, SpO2 99 %. Body mass index is 19.87 kg/m.   Treatment Plan Summary: Daily contact with patient to assess and evaluate symptoms and progress in treatment and Medication management The patient is a 30 year old male with prior diagnoses of ADHD, anxiety, MDD, substance abuse and history of paranoia who was admitted with worsening psychotic symptoms and suicidal ideation in the context of substance use (THC, Kratom, tramadol, Klonopin).  Overall patient appears a bit worse today than yesterday in terms of residual delusions and paranoia on exam.Physically patient does not appear to be having any negative side effects from his LAI; however, clinically there appears to be minimal improvement at this time.  Patient continues to require hospitalization as his thought process and thought content continue to be of concern and are impairing patient's insight and judgment.  Unspecified schizophrenia spectrum and other psychotic d/o  (r/o schizophrenia, r/o delusional d/o, r/o MDD with psychosis, r/o schizoaffective d/o, r/o substance induce psychosis) - Invega Sustenna 251m IM (03/04/2021), then 1580mIM on 10/4, and 23441mM on 11/1  - Rechecking EKG for QTC monitoring with loading of Invega  -Continue Risperdal 2mg30ms.  Delusions  appear to be stable and fixed. Previously held delusions, especially longstanding ones, may remain as part of encoded memory regardless of therapeutic antipsychotic level.  - Continue Diphenhydramine 50mg34mq6h PRN for acute dystonia. No EPS on exam First psychotic break additional labs reviewed: Vitamin D-normal, iron and TIBC-WNL, ferritin-WNL, ammonia-WNL, ESR-WNL, ANA-WNL, RPR-negative, HIV-nonreactive, vitamin B12-308.Head CT noncontrast- negative Heavy metal, ceruloplasmin, copper, B1--pending,  Additional labs reviewed: HgbA1c-4.9, TSH-0.503, lipid panel-WNL, CBC-WNL W/exception Hgb 17.5 (elevated Hgb can be seen with nicotine use), CMP-ALT elevated (58), albumin elevated (5.3), hepatitis panel-HCV antibody reactive,EtOH-WNL   PTSD -Continue 100 mg daily, patient does not want to continue upward titration at the moment.  Medication being prescribed for for PTSD, ruminating thoughts, anxiety and mood symptoms. Patient made aware that this may take 6-8 weeks to be effective.  He can work with outpatient psychiatrist for medication adjustments after discharge.  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 Most recent CIWA was 0 on 10/1 - Continue Nicorette gum 2mg - 17mtinue thiamine 100mg da74m- Continue multivitamin with minerals 1 tablet daily - Substance use treatment possible after discharge   HCV - HCV RNA indicates current infection (332,000 IU/mL) - GI consult and followup as an outpatient   Nerve pain (RUE) - Continue gabapentin 400mg TID40mllergic rhinitis - Continue fluticasone nasal spray 50mcg BID64montinue Singulair 10mg PO da78m- Continue Claritin 10mg qhs   51m - Regular diet - Boost feeding supplement daily - Ensure feeding supplement 237ml daily  8mpo - Discussed shelter housing with patient.  SW gave resources, patient to call -Patient will need resources for hepatitis C treatment as outpatient.  PGY-2 Jai B McQuillFreida Busman2, 1:32 PM

## 2021-03-06 NOTE — Progress Notes (Signed)
Pt did not attend therapeutic relaxation group. 

## 2021-03-06 NOTE — Group Note (Signed)
[facility] LCSW Group Therapy Note  Date/Time:  03/06/2021 10:15am-11:15am  Type of Therapy and Topic:  Group Therapy:  Healthy and Unhealthy Supports  Participation Level:  Minimal   Description of Group:  Patients in this group were introduced to the idea of adding a variety of healthy supports to address the various needs in their lives.Patients discussed what additional healthy supports could be helpful in their recovery and wellness after discharge in order to prevent future hospitalizations.   An emphasis was placed on using counselor, doctor, therapy groups, 12-step groups, and problem-specific support groups to expand supports.    Therapeutic Goals:   1)  discuss importance of adding supports to stay well once out of the hospital  2)  compare healthy versus unhealthy supports and identify some examples of each  3)  generate ideas and descriptions of healthy supports that can be added  4)  offer mutual support about how to address unhealthy supports  5)  encourage active participation in and adherence to discharge plan    Summary of Patient Progress:  The patient stated that current healthy supports in his life are friends and family while current unhealthy supports include other friends and family.  The patient expressed a willingness to add support(s) to help in his recovery journey.   Therapeutic Modalities:   Motivational Interviewing Brief Solution-Focused Therapy  Ambrose Mantle, LCSW

## 2021-03-07 DIAGNOSIS — F29 Unspecified psychosis not due to a substance or known physiological condition: Secondary | ICD-10-CM | POA: Diagnosis not present

## 2021-03-07 LAB — CERULOPLASMIN: Ceruloplasmin: 13.1 mg/dL — ABNORMAL LOW (ref 16.0–31.0)

## 2021-03-07 MED ORDER — HALOPERIDOL 5 MG PO TABS
5.0000 mg | ORAL_TABLET | Freq: Every day | ORAL | Status: DC
  Administered 2021-03-07 – 2021-03-09 (×3): 5 mg via ORAL
  Filled 2021-03-07: qty 1
  Filled 2021-03-07: qty 10
  Filled 2021-03-07 (×3): qty 1

## 2021-03-07 NOTE — Plan of Care (Signed)
  Problem: Activity: Goal: Interest or engagement in activities will improve Outcome: Progressing   Problem: Coping: Goal: Ability to verbalize frustrations and anger appropriately will improve Outcome: Progressing Goal: Ability to demonstrate self-control will improve Outcome: Progressing   

## 2021-03-07 NOTE — Progress Notes (Signed)
Adult Psychoeducational Group Note  Date:  03/07/2021 Time:  8:59 PM  Group Topic/Focus:  Wrap-Up Group:   The focus of this group is to help patients review their daily goal of treatment and discuss progress on daily workbooks.  Participation Level:  Active  Participation Quality:  Appropriate  Affect:  Appropriate  Cognitive:  Appropriate  Insight: Appropriate  Engagement in Group:  Improving  Modes of Intervention:  Discussion  Additional Comments:  Pt stated his goal for today was to focus on his treatment plan. Pt stated he accomplished his goal today. Pt stated he talked with his doctor  and with his social worker about his care today. Pt rated his overall day a 7 out of 10. Pt stated he made no calls today. Pt stated he felt better about himself today. Pt stated he was able to attend all meals. Pt stated he took all medications provided today. Pt stated he attend all groups held today. Pt stated his appetite was pretty good today. Pt rated sleep last night was fair. Pt stated the goal tonight was to get some rest. Pt stated he had some physical pain tonight. Pt stated he had some moderate pain in his right arm tonight. Pt rated the moderate pain in his right arm a 4 on the pain level scale. Pt nurse was updated on the situation. Pt deny visual hallucinations and auditory issues tonight. Pt denies thoughts of harming himself or others.   Pt stated he would alert staff if anything changed.  Jeffery Baldwin 03/07/2021, 8:59 PM

## 2021-03-07 NOTE — Progress Notes (Signed)
   03/07/21 1000  Psych Admission Type (Psych Patients Only)  Admission Status Voluntary  Psychosocial Assessment  Patient Complaints None  Eye Contact Fair  Facial Expression Animated  Affect Appropriate to circumstance  Speech Logical/coherent  Interaction Assertive  Motor Activity Slow  Appearance/Hygiene Unremarkable  Behavior Characteristics Cooperative  Mood Preoccupied  Thought Process  Coherency WDL  Content WDL  Delusions Paranoid  Perception WDL  Hallucination None reported or observed  Judgment Poor  Confusion None  Danger to Self  Current suicidal ideation? Denies  Self-Injurious Behavior No self-injurious ideation or behavior indicators observed or expressed   Agreement Not to Harm Self Yes  Description of Agreement verbal  Danger to Others  Danger to Others None reported or observed  Dart Note: Patient presents with a calm affect and mood.  Denies suicidal thoughts, auditory and visual hallucinations.  Medications given as prescribed.  Routine safety checks maintained.  Attended group and participated.  Patient is safe on and off the unit.

## 2021-03-07 NOTE — Group Note (Signed)
Occupational Therapy Group Note  Group Topic:Communication  Group Date: 03/07/2021 Start Time: 1400 End Time: 1445 Facilitators: Donne Hazel, OT/L   Group Description: Group encouraged increased engagement and participation through discussion focused on communication styles. Patients were educated on the different styles of communication including passive, aggressive, assertive, and passive-aggressive communication. Group members shared and reflected on which styles they most often find themselves communicating in and brainstormed strategies on how to transition and practice a more assertive approach. Further discussion explored how to use assertiveness skills and strategies to further advocate and ask questions as it relates to their treatment plan and mental health.   Therapeutic Goal(s): Identify practical strategies to improve communication skills  Identify how to use assertive communication skills to address individual needs and wants   Participation Level: Active   Participation Quality: Minimal Cues   Behavior: Passive   Speech/Thought Process: Thought-blocking   Affect/Mood: Flat   Insight: Limited   Judgement: Limited   Individualization: Jeffery Baldwin was active in their participation of group discussion/activity. Pt identified being "a little bit of both" in terms of his communication style, however struggled to engage in discussion further. Pt presented with thought-blocking at times and difficulty forming a response when prompted to engage in discussion.   Modes of Intervention: Discussion and Education  Patient Response to Interventions:  Attentive   Plan: Continue to engage patient in OT groups 2 - 3x/week.  03/07/2021  Donne Hazel, OT/L

## 2021-03-07 NOTE — Progress Notes (Addendum)
[facility] MD Progress Note  03/07/2021 7:07 AM Jeffery Baldwin  MRN:  [id number]  Subjective:  Jeffery Baldwin is a 30 year old male with a psychiatric history of ADHD, anxiety, MDD, substance abuse and history of paranoia who was admitted with worsening psychotic symptoms and suicidal ideation in the context of substance use (THC, Kratom, tramadol, Klonopin).    On assessment today (10/3): Case was discussed in the multidisciplinary team. MAR was reviewed and patient was compliant with medications.  Overnight patient had no behavioral concerns and continues to interact well with other patients in the milieu.  Patient reports that he is "ok" today and has no concerns. He slept well, and his appetite is intact. Patient continues to report that he does not believe his mother is truly dead and that he is unsure what it will take to convince him that she is deceased. He is still ruminating on there being "too many coincidences" surrounding her death.  He continues to talk about "omens" but when asked about ideas of reference or receiving messages on the unit he is evasive today. Patient denies SI, HI  AVH, paranoia, or magical thinking. He is initially evasive when questioned about belief in thought broadcasting or thought insertion/withdrawal but eventually states that he does not think he has had these issues in about 24 hours.  He voices no physical complaints. Our team discussed switching patient's Risperdal to Haldol to take in addition to the [place] injection, and patient was agreeable.   Principal Problem: Psychosis, 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[place]) Diagnosis: Principal Problem:   Psychosis, 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[place]) Active Problems:   Delusional disorder ([place])   Anxious reaction   Chronic post-traumatic stress disorder (PTSD)   Marijuana use, continuous  Total Time Spent in Direct Patient Care:  I personally spent 25 minutes on the unit in direct patient care. The direct patient care time  included face-to-face time with the patient, reviewing the patient's chart, communicating with other professionals, and coordinating care. Greater than 50% of this time was spent in counseling or coordinating care with the patient regarding goals of hospitalization, psycho-education, and discharge planning needs.  Past Psychiatric History: See H&P  Past Medical History:  Past Medical History:  Diagnosis Date   Chronic pain syndrome    Heroin abuse ([place])    Nerve root avulsion    Spinal cord injury at C5-C7 level without injury of spinal bone ([place]) 2011    Past Surgical History:  Procedure Laterality Date   nerve root repair     Family History: see H&P  Family Psychiatric  History: See H&P  Social History:  Social History   Substance and Sexual Activity  Alcohol Use Yes   Alcohol/week: 0.0 standard drinks   Comment: occasional     Social History   Substance and Sexual Activity  Drug Use Yes   Comment: heroine    Social History   Socioeconomic History   Marital status: Single    Spouse name: Not on file   Number of children: Not on file   Years of education: Not on file   Highest education level: Not on file  Occupational History   Not on file  Tobacco Use   Smoking status: Some Days    Packs/day: 0.20    Years: 7.00    Pack years: 1.40    Types: Cigarettes   Smokeless tobacco: Never   Tobacco comments:    decreased smoking  Substance and Sexual Activity   Alcohol use: Yes  Alcohol/week: 0.0 standard drinks    Comment: occasional   Drug use: Yes    Comment: heroine   Sexual activity: Yes  Other Topics Concern   Not on file  Social History Narrative   ** Merged History Encounter **       Social Determinants of Health   Financial Resource Strain: Not on file  Food Insecurity: Not on file  Transportation Needs: Not on file  Physical Activity: Not on file  Stress: Not on file  Social Connections: Not on file   Sleep: Fair  Appetite:   Fair  Current Medications: Current Facility-Administered Medications  Medication Dose Route Frequency Provider Last Rate Last Admin   acetaminophen (TYLENOL) tablet 650 mg  650 mg Oral Q6H PRN Prescilla Sours, PA-C   650 mg at 03/02/21 2109   alum & mag hydroxide-simeth (MAALOX/MYLANTA) 200-200-20 MG/5ML suspension 30 mL  30 mL Oral Q4H PRN Margorie John W, PA-C       diphenhydrAMINE (BENADRYL) capsule 50 mg  50 mg Oral Q6H PRN Lavella Hammock, MD       Or   diphenhydrAMINE (BENADRYL) injection 50 mg  50 mg Intramuscular Q6H PRN Lavella Hammock, MD       diphenhydrAMINE (BENADRYL) injection 50 mg  50 mg Intramuscular Q6H PRN Lavella Hammock, MD       feeding supplement (BOOST / RESOURCE BREEZE) liquid 1 Container  1 Container Oral Q24H Harlow Asa, MD   1 Container at 03/06/21 0934   feeding supplement (ENSURE ENLIVE / ENSURE PLUS) liquid 237 mL  237 mL Oral Q24H Nelda Marseille, Karla Pavone E, MD   237 mL at 03/06/21 2108   fluticasone (FLONASE) 50 MCG/ACT nasal spray 2 spray  2 spray Each Nare BID AC & HS Lavella Hammock, MD   2 spray at 03/06/21 2109   gabapentin (NEURONTIN) capsule 400 mg  400 mg Oral TID Janine Limbo, MD   400 mg at 03/06/21 2109   loratadine (CLARITIN) tablet 10 mg  10 mg Oral QHS Lavella Hammock, MD   10 mg at 03/06/21 2110   risperiDONE (RISPERDAL M-TABS) disintegrating tablet 1 mg  1 mg Oral Q8H PRN Arthor Captain, MD       And   LORazepam (ATIVAN) tablet 1 mg  1 mg Oral PRN Arthor Captain, MD       And   ziprasidone (GEODON) injection 20 mg  20 mg Intramuscular PRN Arthor Captain, MD       magnesium hydroxide (MILK OF MAGNESIA) suspension 30 mL  30 mL Oral Daily PRN Margorie John W, PA-C       montelukast (SINGULAIR) tablet 10 mg  10 mg Oral Doroteo Bradford, MD   10 mg at 03/07/21 6387   multivitamin with minerals tablet 1 tablet  1 tablet Oral Daily Arthor Captain, MD   1 tablet at 03/06/21 5643   nicotine polacrilex (NICORETTE) gum 2 mg  2 mg Oral PRN  Prescilla Sours, PA-C   2 mg at 03/06/21 1835   [START ON 03/08/2021] paliperidone (INVEGA SUSTENNA) injection 156 mg  156 mg Intramuscular Once Lavella Hammock, MD       Followed by   Derrill Memo ON 04/05/2021] paliperidone (INVEGA SUSTENNA) injection 234 mg  234 mg Intramuscular Once Lavella Hammock, MD       risperiDONE (RISPERDAL M-TABS) disintegrating tablet 2 mg  2 mg Oral QHS Lavella Hammock, MD   2  mg at 03/06/21 2110   sertraline (ZOLOFT) tablet 100 mg  100 mg Oral Daily Lavella Hammock, MD   100 mg at 03/06/21 7096   thiamine tablet 100 mg  100 mg Oral Daily Arthor Captain, MD   100 mg at 03/06/21 2836    Lab Results:  Results for orders placed or performed during the hospital encounter of 02/23/21 (from the past 48 hour(s))  Vitamin B12     Status: None   Collection Time: 03/05/21  6:26 PM  Result Value Ref Range   Vitamin B-12 308 180 - 914 pg/mL    Comment: (NOTE) This assay is not validated for testing neonatal or myeloproliferative syndrome specimens for Vitamin B12 levels. Performed at [facility], [place] [address], [place], [place] [address]     Blood Alcohol level:  Lab Results  Component Value Date   ETH <10 02/22/2021   ETH <10 65/46/5035    Metabolic Disorder Labs: Lab Results  Component Value Date   HGBA1C 4.9 02/22/2021   MPG 93.93 02/22/2021   No results found for: PROLACTIN Lab Results  Component Value Date   CHOL 166 02/22/2021   TRIG 42 02/22/2021   HDL 93 02/22/2021   CHOLHDL 1.8 02/22/2021   VLDL 8 02/22/2021   LDLCALC 65 02/22/2021    Physical Findings: AIMS: Facial and Oral Movements Muscles of Facial Expression: None, normal Lips and Perioral Area: None, normal Jaw: None, normal Tongue: None, normal,Extremity Movements Upper (arms, wrists, hands, fingers): None, normal Lower (legs, knees, ankles, toes): None, normal, Trunk Movements Neck, shoulders, hips: None, normal, Overall Severity Severity of abnormal  movements (highest score from questions above): None, normal Incapacitation due to abnormal movements: None, normal Patient's awareness of abnormal movements (rate only patient's report): No Awareness, Dental Status Current problems with teeth and/or dentures?: No Does patient usually wear dentures?: No  CIWA:  CIWA-Ar Total: 0    Musculoskeletal: Strength & Muscle Tone: within normal limits for patient, right arm remains flexed at the elbow Gait & Station: normal, steady Patient leans: N/A  Psychiatric Specialty Exam:  Presentation  General Appearance: casually dressed, adequate hygiene  Eye Contact:Fair  Speech:Clear and Coherent  Speech Volume:Decreased  Handedness:-- (Not assessed)   Mood and Affect  Mood: described as "ok" but appears aloof  Affect: guarded, constricted  Thought Process  Thought Processes: linear but concrete  Orientation:Full (Time, Place and Person)  Thought Content: He appears less paranoid on exam but is still guarded. He is not grossly responding to internal/external stimuli during assessment; he has residual delusion that his mother is not deceased which appears fixed in nature and he continues to endorse belief in "omens"; he denies AVH; He denies current ideas of reference or first rank symptoms in the last 24 hours  History of Schizophrenia/Schizoaffective disorder:No  Duration of Psychotic Symptoms:Greater than six months  Hallucinations:Denied  Ideas of Reference:Makes reference to "omens" but denies receiving messages from TV   Suicidal Thoughts:Suicidal Thoughts: No  Homicidal Thoughts:Homicidal Thoughts: No   Sensorium  Memory:Immediate Good; Recent Fair; Remote Fair  Judgment:Fair - complying with meds and changes. Agreeable to have his distress treated.  Insight:Shallow   Executive Functions  Concentration:Fair  Attention Span:Fair  [place]   Psychomotor Activity   Psychomotor Activity:Right UE with limited mobility and flexed at elbow; AIMS 0; no tremor, cogwheeling, or stiffness of LUE on exam; steady gait  Assets  Assets:Resilience  Sleep  Sleep:6.5 hours  Physical Exam HENT:     Head: Normocephalic and atraumatic.  Pulmonary:     Effort: Pulmonary effort is normal.  Musculoskeletal:     Comments: ROM baseline for patient  Neurological:     Mental Status: He is alert and oriented to person, place, and time.   Review of Systems  Respiratory:  Negative for shortness of breath.   Cardiovascular:  Negative for chest pain.  Gastrointestinal:  Negative for constipation, diarrhea, nausea and vomiting.  Psychiatric/Behavioral:  Negative for hallucinations and suicidal ideas.   Blood pressure 118/66, pulse 71, temperature 97.9 F (36.6 C), temperature source Oral, resp. rate 16, height 5\' 11"  (1.803 m), weight 64.6 kg, SpO2 100 %. Body mass index is 19.87 kg/m.   Treatment Plan Summary: Daily contact with patient to assess and evaluate symptoms and progress in treatment and Medication management  The patient is a 29 year old male with prior diagnoses of ADHD, anxiety, MDD, substance abuse and history of paranoia who was admitted with worsening psychotic symptoms and suicidal ideation in the context of substance use (THC, Kratom, Tramadol, and Klonopin). He appears unchanged from yesterday in terms of residual delusions but improved in terms of appearing mildly less paranoid with some reported improvement in thought broadcasting/thought insertion. Physically patient does not appear to be having any negative side effects from his LAI; however, clinically there appears to be minimal improvement at this time.  Patient continues to require hospitalization as his thought process and thought content continue to be of concern and are impairing patient's insight and judgment.  Unspecified schizophrenia spectrum and other psychotic d/o  (r/o schizophrenia,  r/o delusional d/o, r/o MDD with psychosis, r/o schizoaffective d/o, r/o substance induce psychosis) - Lorayne Bender Sustenna 234mg  IM (03/04/2021), then 156mg  IM on 10/4, and 234mg  IM on 11/1  - QTC 397 on recheck 10/2 monitoring with loading of Invega  -Discontinue Risperdal 2mg  qhs, and begin Haldol 5 mg nightly (r/b/se/a to medication reviewed and he consents to med trial) .  Delusions appear to be stable and fixed. Previously held delusions, especially longstanding ones, may remain as part of encoded memory regardless of therapeutic antipsychotic level.  - Continue Diphenhydramine 50mg  IM q6h PRN for acute dystonia. No EPS on exam First psychotic break additional labs reviewed: Vitamin D-normal, iron and TIBC-WNL, ferritin-WNL, ammonia-WNL, ESR-WNL, ANA-WNL, RPR-negative, HIV-nonreactive, vitamin B12-308.Head CT noncontrast- negative Heavy metal, ceruloplasmin, serum copper, B1--pending,  Additional labs reviewed: HgbA1c-4.9, TSH-0.503, lipid panel-WNL, CBC-WNL W/exception Hgb 17.5 (elevated Hgb can be seen with nicotine use), CMP-ALT elevated (58), albumin elevated (5.3), hepatitis panel-HCV antibody reactive,EtOH-WNL   PTSD -Continue Zoloft 100 mg daily, patient does not want to continue upward titration at the moment.  Medication being prescribed for PTSD, ruminating thoughts, anxiety and mood symptoms. Patient made aware that this may take 6-8 weeks to be effective.  He can work with outpatient psychiatrist for medication adjustments after discharge.  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 (Tramadol, Roxicodone) Tobacco use d/o - Discontinued CIWA protocol based on scores and no signs of withdrawal - Continue Nicorette gum 2mg  - Continue thiamine 100mg  daily - Continue multivitamin with minerals 1 tablet daily - Substance use treatment possible after discharge   HCV - HCV RNA indicates current infection (332,000 IU/mL) -  GI consult and followup as an outpatient   Nerve pain (RUE) - Continue gabapentin 400mg  TID   Allergic rhinitis - Continue fluticasone nasal spray 49mcg BID - Continue Singulair 10mg  PO  daily - Continue Claritin $RemoveBeforeDEI'10mg'[id number]$  qhs   Diet - Regular diet - Boost feeding supplement daily - Ensure feeding supplement 244ml daily   Dispo - Discussed shelter housing with patient. SW gave resources, patient to call -Patient will need resources for hepatitis C treatment as outpatient.  Rosezetta Schlatter, MD PGY-1 03/07/2021 [facility] Department of Psychiatry

## 2021-03-07 NOTE — B[facility] Group Notes (Signed)
Adult Psychoeducational Group Note  Date:  03/07/2021 Time:  6:43 PM  Group Topic/Focus:  Wellness Toolbox:   The focus of this group is to discuss various aspects of wellness, balancing those aspects and exploring ways to increase the ability to experience wellness.  Patients will create a wellness toolbox for use upon discharge.  Participation Level:  Active  Participation Quality:  Appropriate  Affect:  Appropriate  Cognitive:  Appropriate  Insight: Good  Engagement in Group:  Engaged  Modes of Intervention:  Discussion  Additional Comments:  Pt say he needs to do better at taking action  Donell Beers 03/07/2021, 6:43 PM

## 2021-03-07 NOTE — Progress Notes (Signed)
     03/06/21 2110  Psych Admission Type (Psych Patients Only)  Admission Status Voluntary  Psychosocial Assessment  Patient Complaints None  Eye Contact Fair  Facial Expression Animated  Affect Appropriate to circumstance  Speech Logical/coherent  Interaction Assertive  Motor Activity Slow  Appearance/Hygiene Unremarkable  Behavior Characteristics Cooperative  Mood Pleasant;Anxious  Thought Process  Coherency WDL  Content WDL  Delusions Paranoid  Perception WDL  Hallucination None reported or observed  Judgment Poor  Confusion None  Danger to Self  Current suicidal ideation? Denies  Self-Injurious Behavior No self-injurious ideation or behavior indicators observed or expressed   Agreement Not to Harm Self Yes  Description of Agreement verbal  Danger to Others  Danger to Others None reported or observed

## 2021-03-07 NOTE — Group Note (Signed)
Recreation Therapy Group Note   Group Topic:Coping Skills  Group Date: 03/07/2021 Start Time: 1000 End Time: 1035 Facilitators: Caroll Rancher, LRT/CTRS Location: [address]  Goal Area(s) Addresses:  Patient will identify positive coping skills. Patient will identify benefit of using positive coping skills post d/c.  Group Description:  Mind Map.  Patients were given a blank diagram of a mind map.  LRT and patients identified 8 instances in which positive coping skills can be used (depression, trauma, anger, anxiety, stress, tragedy, drama and sickness).  Patients were then given time to come up with at least 3 coping skills for each instance identified.  LRT would then fill in the coping skills on the board giving any one with blank boxes on their chart to fill them in coping skills they did not have.    Affect/Mood: Appropriate   Participation Level: Minimal   Participation Quality: Independent   Behavior: Appropriate   Speech/Thought Process: None   Insight: None   Judgement: None   Modes of Intervention: Guided Discussion and Worksheet   Patient Response to Interventions:  Attentive   Education Outcome:  Acknowledges education and In group clarification offered    Clinical Observations/Individualized Feedback: Pt was observed filling out diagram.  Pt sat quietly and observed for the majority of group.  Pt didn't offer any responses to coping skills or discussion.    Plan: Continue to engage patient in RT group sessions 2-3x/week.   Caroll Rancher, LRT/CTRS 03/07/2021 12:58 PM

## 2021-03-07 NOTE — Group Note (Signed)
LCSW Group Therapy Note   Group Date: 03/07/2021 Start Time: 1300 End Time: 1400   Type of Therapy and Topic:  Group Therapy:   Participation Level:  Active  Patients received a worksheet with an outline of 2 gingerbread men with a separation in the middle of the page. One sign designated what the pt sees about themselves and the other is what others see. Pts were asked to introduce themselves and share something they like about themself. Pts were then asked to draw, write or color how they view themselves as well as how they are viewed by others. CSW led discussion about the feelings and words associated with each side.    Patient Summary:   During introductions pt shared their name and stated they liked that they are compassionate. Pt was appropriate and participated in group discussion.  Aram Beecham, LCSWA 03/07/2021  2:34 PM

## 2021-03-08 LAB — ANA W/REFLEX IF POSITIVE: Anti Nuclear Antibody (ANA): NEGATIVE

## 2021-03-08 LAB — COPPER, SERUM

## 2021-03-08 MED ORDER — CYANOCOBALAMIN 1000 MCG/ML IJ SOLN
1000.0000 ug | Freq: Once | INTRAMUSCULAR | Status: AC
  Administered 2021-03-08: 1000 ug via INTRAMUSCULAR
  Filled 2021-03-08: qty 1

## 2021-03-08 NOTE — Plan of Care (Signed)
  Problem: Safety: Goal: Periods of time without injury will increase Outcome: Progressing   Problem: Activity: Goal: Will verbalize the importance of balancing activity with adequate rest periods Outcome: Progressing   Problem: Education: Goal: Will be free of psychotic symptoms Outcome: Progressing   

## 2021-03-08 NOTE — Group Note (Signed)
Recreation Therapy Group Note   Group Topic:Animal Assisted Therapy   Group Date: 03/08/2021 Start Time: 1430 End Time: 1515 Facilitators: Caroll Rancher, LRT/CTRS Location: [address]  AAA/T Program Assumption of Risk Form signed by Patient/ or Parent Legal Guardian Yes  Patient is free of allergies or severe asthma Yes  Patient reports no fear of animals Yes  Patient reports no history of cruelty to animals Yes  Patient understands his/her participation is voluntary Yes  Affect/Mood: Appropriate   Participation Level: Engaged   Participation Quality: Independent   Behavior: Appropriate   Speech/Thought Process: Focused   Insight: Good   Judgement: Good   Modes of Intervention: Tax adviser Therapy   Patient Response to Interventions:  Attentive and Engaged   Education Outcome:  Acknowledges education and In group clarification offered    Clinical Observations/Individualized Feedback: Patient attended session and interacted appropriately with therapy dog and peers. Patient asked appropriate questions about therapy dog and his training. Patient shared stories about their pets at home with group.     Plan: Continue to engage patient in RT group sessions 2-3x/week.   Caroll Rancher, LRT/CTRS 03/08/2021 4:12 PM

## 2021-03-08 NOTE — Group Note (Signed)
Recreation Therapy Group Note   Group Topic:Stress Management  Group Date: 03/08/2021 Start Time: 0900 End Time: 0950 Facilitators: Caroll Rancher, LRT/CTRS Location: 300 [address] Dayroom  Goal Area(s) Addresses:  Patient will actively participate in stress management techniques presented during session.  Patient will successfully identify benefit of practicing stress management post d/c.    Group Description:  Meditation. LRT provided education and instruction for meditation. Patient was asked to participate in the technique introduced during session. LRT debriefed on technique used during group session. Patients were given suggestions of ways to access scripts post d/c and encouraged to explore Youtube and other apps available on smartphones, tablets, and computers.   Affect/Mood: Appropriate   Participation Level: Engaged   Participation Quality: Independent   Behavior: Appropriate   Speech/Thought Process: Focused   Insight: Good   Judgement: Good   Modes of Intervention: Meditation   Patient Response to Interventions:  Engaged   Education Outcome:  Acknowledges education and In group clarification offered    Clinical Observations/Individualized Feedback: Pt was fully engaged in group.  Pt even fell asleep during the meditation.  When pt woke up, pt was asking how long the meditation was, when LRT told pt it was 20 minutes, pt stated it felt like 5 minutes.  Pt was appropriate and also expressed the different sounds and ways that meditation could be listened to.    Plan: Continue to engage patient in RT group sessions 2-3x/week.   Caroll Rancher, LRT/CTRS 03/08/2021 11:46 AM

## 2021-03-08 NOTE — Progress Notes (Signed)
Pt didn't attend psycho-ed group.  

## 2021-03-08 NOTE — Progress Notes (Signed)
  Pt presents with no complaints.  Pt pleasant but appears to be preoccupied.  Pt observed in milieu interacting.  Pt denies SI/HI, and verbally contracts for safety.  Pt denies AVH.  Medication administered per MAR.  Pt remains safe on unit with Q 15 minute safety checks.    03/07/21 2122  Psych Admission Type (Psych Patients Only)  Admission Status Voluntary  Psychosocial Assessment  Patient Complaints None  Eye Contact Fair  Facial Expression Animated  Affect Appropriate to circumstance  Speech Logical/coherent  Interaction Assertive  Motor Activity Slow  Appearance/Hygiene Unremarkable  Behavior Characteristics Cooperative  Mood Preoccupied  Thought Process  Coherency WDL  Content WDL  Delusions Paranoid  Perception WDL  Hallucination None reported or observed  Judgment Poor  Confusion None  Danger to Self  Current suicidal ideation? Denies  Self-Injurious Behavior No self-injurious ideation or behavior indicators observed or expressed   Agreement Not to Harm Self Yes  Description of Agreement verbal  Danger to Others  Danger to Others None reported or observed

## 2021-03-08 NOTE — Progress Notes (Signed)
Adult Psychoeducational Group Note  Date:  03/08/2021 Time:  10:42 PM  Group Topic/Focus:  Wrap-Up Group:   The focus of this group is to help patients review their daily goal of treatment and discuss progress on daily workbooks.  Participation Level:  Active  Participation Quality:  Appropriate  Affect:  Appropriate  Cognitive:  Appropriate  Insight: Appropriate  Engagement in Group:  Improving  Modes of Intervention:  Discussion  Additional Comments:  Pt stated his goal for today was to focus on his treatment plan and talk with his doctor about a medication issue. Pt stated he accomplished his goals today. Pt stated he talked with his doctor  and with his social worker about his care today. Pt rated his overall day a 6 out of 10. Pt stated he made no calls today. Pt stated he felt better about himself today. Pt stated he was able to attend all meals. Pt stated he took all medications provided today. Pt stated he attend all groups held today. Pt stated his appetite was pretty good today. Pt rated sleep last night was fair. Pt stated the goal tonight was to get some rest. Pt stated he had some physical pain tonight. Pt stated he had some moderate pain in his right arm tonight. Pt rated the moderate pain in his right arm a 4 on the pain level scale. Pt nurse was updated on the situation. Pt deny visual hallucinations and auditory issues tonight. Pt denies thoughts of harming himself or others.   Pt stated he would alert staff if anything changed.  Felipa Furnace 03/08/2021, 10:42 PM

## 2021-03-08 NOTE — Progress Notes (Signed)
Pt visible on the unit this evening, pt stated his day was not too good most of the day but got better as the day went on. Pt given PRN gum per MAR with HS medication    03/08/21 2000  Psych Admission Type (Psych Patients Only)  Admission Status Voluntary  Psychosocial Assessment  Patient Complaints Anxiety  Eye Contact Fair  Facial Expression Animated;Anxious  Affect Anxious;Appropriate to circumstance  Speech Logical/coherent  Interaction Assertive  Motor Activity Slow  Appearance/Hygiene Unremarkable  Behavior Characteristics Cooperative;Appropriate to situation  Mood Anxious;Pleasant  Thought Process  Coherency WDL  Content WDL  Delusions None reported or observed  Perception WDL  Hallucination None reported or observed  Judgment WDL  Danger to Self  Current suicidal ideation? Denies  Danger to Others  Danger to Others None reported or observed

## 2021-03-08 NOTE — Progress Notes (Addendum)
[facility] MD Progress Note  03/08/2021 7:10 AM Jeffery Baldwin  MRN:  [id number]  Subjective:  Jeffery Baldwin is a 30 year old male with a psychiatric history of ADHD, anxiety, MDD, substance abuse and history of paranoia who was admitted with worsening psychotic symptoms and suicidal ideation in the context of substance use (THC, Kratom, tramadol, Klonopin).    On assessment today (10/4): Case was discussed in the multidisciplinary team. MAR was reviewed and patient was not compliant with medications: he refused the Zoloft.  Overnight patient had no behavioral concerns and continues to interact well with other patients in the milieu.  Patient reports that he is "fine but tired from the [place] injection" today; otherwise, he has no concerns. He slept well, and his appetite is intact. Patient continues to report that he does not believe his mother is truly dead and that nothing will convince him otherwise; although, the "coincidences," the omens, and the feelings of experiences repeating themselves is no longer distressing to him nor topics on which he ruminates. He is very pleasant and forward-thinking today. 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 Patient requested a B12 injection today, as he heard from his dad that it is good for energy levels.    Patient denies SI, HI  AVH, paranoia, or magical thinking. He is initially evasive when questioned about belief in thought broadcasting or thought insertion/withdrawal but eventually states that he does not think he has had these issues in about 24 hours.  He voices no physical complaints. Our team discussed switching patient's Risperdal to Haldol to take in addition to the [place] injection, and patient was agreeable.   Principal Problem: Psychosis, 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[place]) Diagnosis: Principal Problem:    Psychosis, 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[place]) Active Problems:   Delusional disorder ([place])   Anxious reaction   Chronic post-traumatic stress disorder (PTSD)   Marijuana use, continuous  Total Time Spent in Direct Patient Care:  I personally spent 25 minutes on the unit in direct patient care. The direct patient care time included face-to-face time with the patient, reviewing the patient's chart, communicating with other professionals, and coordinating care. Greater than 50% of this time was spent in counseling or coordinating care with the patient regarding goals of hospitalization, psycho-education, and discharge planning needs.  Past Psychiatric History: See H&P  Past Medical History:  Past Medical History:  Diagnosis Date   Chronic pain syndrome    Heroin abuse ([employer])    Nerve root avulsion    Spinal cord injury at C5-C7 level without injury of spinal bone ([place]) 2011    Past Surgical History:  Procedure Laterality Date   nerve root repair     Family History: see H&P  Family Psychiatric  History: See H&P  Social History:  Social History   Substance and Sexual Activity  Alcohol Use Yes   Alcohol/week: 0.0 standard drinks   Comment: occasional     Social History   Substance and Sexual Activity  Drug Use Yes   Comment: heroine    Social History   Socioeconomic History   Marital status: Single    Spouse name: Not on file   Number of children: Not on file   Years of education: Not on file   Highest education level: Not on file  Occupational History   Not on file  Tobacco Use  Smoking status: Some Days    Packs/day: 0.20    Years: 7.00    Pack years: 1.40    Types: Cigarettes   Smokeless tobacco: Never   Tobacco comments:    decreased smoking  Substance and Sexual Activity   Alcohol use: Yes    Alcohol/week: 0.0 standard drinks    Comment: occasional   Drug use: Yes    Comment: heroine   Sexual activity: Yes  Other Topics Concern   Not on file  Social  History Narrative   ** Merged History Encounter **       Social Determinants of Health   Financial Resource Strain: Not on file  Food Insecurity: Not on file  Transportation Needs: Not on file  Physical Activity: Not on file  Stress: Not on file  Social Connections: Not on file   Sleep: Fair  Appetite:  Fair  Current Medications: Current Facility-Administered Medications  Medication Dose Route Frequency Provider Last Rate Last Admin   acetaminophen (TYLENOL) tablet 650 mg  650 mg Oral Q6H PRN Prescilla Sours, PA-C   650 mg at 03/02/21 2109   alum & mag hydroxide-simeth (MAALOX/MYLANTA) 200-200-20 MG/5ML suspension 30 mL  30 mL Oral Q4H PRN Margorie John W, PA-C       diphenhydrAMINE (BENADRYL) capsule 50 mg  50 mg Oral Q6H PRN Lavella Hammock, MD       Or   diphenhydrAMINE (BENADRYL) injection 50 mg  50 mg Intramuscular Q6H PRN Lavella Hammock, MD       diphenhydrAMINE (BENADRYL) injection 50 mg  50 mg Intramuscular Q6H PRN Lavella Hammock, MD       feeding supplement (BOOST / RESOURCE BREEZE) liquid 1 Container  1 Container Oral Q24H Harlow Asa, MD   1 Container at 03/06/21 0934   feeding supplement (ENSURE ENLIVE / ENSURE PLUS) liquid 237 mL  237 mL Oral Q24H Nelda Marseille, Amy E, MD   237 mL at 03/07/21 2121   fluticasone (FLONASE) 50 MCG/ACT nasal spray 2 spray  2 spray Each Nare BID AC & HS Lavella Hammock, MD   2 spray at 03/07/21 2122   gabapentin (NEURONTIN) capsule 400 mg  400 mg Oral TID Janine Limbo, MD   400 mg at 03/07/21 2121   haloperidol (HALDOL) tablet 5 mg  5 mg Oral QHS Rosezetta Schlatter, MD   5 mg at 03/07/21 2121   loratadine (CLARITIN) tablet 10 mg  10 mg Oral QHS Lavella Hammock, MD   10 mg at 03/07/21 2121   risperiDONE (RISPERDAL M-TABS) disintegrating tablet 1 mg  1 mg Oral Q8H PRN Arthor Captain, MD       And   LORazepam (ATIVAN) tablet 1 mg  1 mg Oral PRN Arthor Captain, MD       And   ziprasidone (GEODON) injection 20 mg  20 mg Intramuscular  PRN Arthor Captain, MD       magnesium hydroxide (MILK OF MAGNESIA) suspension 30 mL  30 mL Oral Daily PRN Margorie John W, PA-C       montelukast (SINGULAIR) tablet 10 mg  10 mg Oral Doroteo Bradford, MD   10 mg at 03/07/21 6433   multivitamin with minerals tablet 1 tablet  1 tablet Oral Daily Arthor Captain, MD   1 tablet at 03/07/21 2951   nicotine polacrilex (NICORETTE) gum 2 mg  2 mg Oral PRN Prescilla Sours, PA-C   2 mg at 03/07/21  2144   paliperidone (INVEGA SUSTENNA) injection 156 mg  156 mg Intramuscular Once Lavella Hammock, MD       Followed by   Derrill Memo ON 04/05/2021] paliperidone (INVEGA SUSTENNA) injection 234 mg  234 mg Intramuscular Once Lavella Hammock, MD       sertraline (ZOLOFT) tablet 100 mg  100 mg Oral Daily Lavella Hammock, MD   100 mg at 03/06/21 2229   thiamine tablet 100 mg  100 mg Oral Daily Arthor Captain, MD   100 mg at 03/07/21 7989    Lab Results:  No results found for this or any previous visit (from the past 20 hour(s)).   Blood Alcohol level:  Lab Results  Component Value Date   ETH <10 02/22/2021   ETH <10 21/19/4174    Metabolic Disorder Labs: Lab Results  Component Value Date   HGBA1C 4.9 02/22/2021   MPG 93.93 02/22/2021   No results found for: PROLACTIN Lab Results  Component Value Date   CHOL 166 02/22/2021   TRIG 42 02/22/2021   HDL 93 02/22/2021   CHOLHDL 1.8 02/22/2021   VLDL 8 02/22/2021   LDLCALC 65 02/22/2021    Physical Findings: AIMS: Facial and Oral Movements Muscles of Facial Expression: None, normal Lips and Perioral Area: None, normal Jaw: None, normal Tongue: None, normal,Extremity Movements Upper (arms, wrists, hands, fingers): None, normal Lower (legs, knees, ankles, toes): None, normal, Trunk Movements Neck, shoulders, hips: None, normal, Overall Severity Severity of abnormal movements (highest score from questions above): None, normal Incapacitation due to abnormal movements: None, normal Patient's  awareness of abnormal movements (rate only patient's report): No Awareness, Dental Status Current problems with teeth and/or dentures?: No Does patient usually wear dentures?: No  CIWA:  CIWA-Ar Total: 0    Musculoskeletal: Strength & Muscle Tone: within normal limits for patient, right arm remains flexed at the elbow Gait & Station: normal, steady Patient leans: N/A  Psychiatric Specialty Exam:  Presentation  General Appearance: casually dressed, adequate hygiene  Eye Contact:Fleeting (Shifted gaze to each of the members of our team.)  Speech:Clear and Coherent; Normal Rate  Speech Volume:Normal  Handedness:-- (Not assessed)   Mood and Affect  Mood: described as "ok" but appears aloof  Affect: guarded, constricted  Thought Process  Thought Processes: linear but concrete  Orientation:Full (Time, Place and Person)  Thought Content: He no longer appears paranoid nor guarded on exam. He is not grossly responding to internal/external stimuli during assessment; he has residual delusion that his mother is not deceased and about omens which appears fixed in nature but no longer distressing; he denies AVH; He denies current ideas of reference or first rank symptoms  History of Schizophrenia/Schizoaffective disorder:No  Duration of Psychotic Symptoms:Greater than six months  Hallucinations:Denied  Ideas of Reference:Denies today  Suicidal Thoughts:No data recorded Denies  Homicidal Thoughts:No data recorded Denies   Sensorium  Memory:Immediate Good; Recent Fair; Remote Fair  [place] to have his distress treated.  Insight:Shallow   Executive Functions  Concentration:Good  Attention Span:Good  [place]   Psychomotor Activity  Psychomotor Activity:Right UE with limited mobility and flexed at elbow; AIMS 0; no tremor, cogwheeling, or stiffness of LUE on exam; steady gait  Assets   Assets:Resilience  Sleep  Sleep:6.75 hours  Physical Exam HENT:     Head: Normocephalic and atraumatic.  Pulmonary:     Effort: Pulmonary effort is normal.  Musculoskeletal:     Comments: ROM  baseline for patient  Neurological:     Mental Status: He is alert and oriented to person, place, and time.   Review of Systems  Respiratory:  Negative for shortness of breath.   Cardiovascular:  Negative for chest pain.  Gastrointestinal:  Negative for constipation, diarrhea, nausea and vomiting.  Psychiatric/Behavioral:  Negative for hallucinations and suicidal ideas.   Blood pressure 101/73, pulse 77, temperature 97.6 F (36.4 C), temperature source Oral, resp. rate 16, height 5\' 11"  (1.803 m), weight 64.6 kg, SpO2 97 %. Body mass index is 19.87 kg/m.   Treatment Plan Summary: Daily contact with patient to assess and evaluate symptoms and progress in treatment and Medication management  The patient is a 30 year old male with prior diagnoses of ADHD, anxiety, MDD, substance abuse and history of paranoia who was admitted with worsening psychotic symptoms and suicidal ideation in the context of substance use (THC, Kratom, Tramadol, and Klonopin). He appears significantly improved from yesterday in terms of residual delusions, less paranoid appearing, and no first rank sx. Physically patient does not appear to be having any negative side effects from his LAI, and he appears to be clinically improving with the addition of Haldol.  Patient continues to require hospitalization for safe disposition planning.  Unspecified schizophrenia spectrum and other psychotic d/o  (r/o schizophrenia, r/o delusional d/o, r/o MDD with psychosis, r/o schizoaffective d/o, r/o substance induce psychosis) - Kirt Boys 234mg  IM (03/04/2021). Received 156mg  IM today, and 234mg  IM on 11/1  - QTC 397 on recheck 10/2 monitoring with loading of Invega  -Continue Haldol 5 mg nightly. Delusions appear to be stable and  fixed, but no longer distressing. Previously held delusions, especially longstanding ones, may remain as part of encoded memory regardless of therapeutic antipsychotic level.  - Continue Diphenhydramine 50mg  IM q6h PRN for acute dystonia. No EPS on exam First psychotic break additional labs reviewed: Vitamin D-normal, iron and TIBC-WNL, ferritin-WNL, ammonia-WNL, ESR-WNL, ANA-WNL, RPR-negative, HIV-nonreactive, vitamin B12-308.Head CT noncontrast- negative Heavy metal, ceruloplasmin, serum copper, B1--pending,  Additional labs reviewed: HgbA1c-4.9, TSH-0.503, lipid panel-WNL, CBC-WNL W/exception Hgb 17.5 (elevated Hgb can be seen with nicotine use), CMP-ALT elevated (58), albumin elevated (5.3), hepatitis panel-HCV antibody reactive,EtOH-WNL -B12 injection 1000 mcg IM given, per patient request.   PTSD -Discontinue Zoloft 100 mg daily, per patient request.  Advised that he can work with outpatient psychiatrist after discharge to determine the need for and to adjust medications; patient agreeable.  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 (Tramadol, Roxicodone) Tobacco use d/o - Discontinued CIWA protocol based on scores and no signs of withdrawal - Continue Nicorette gum 2mg  - Continue thiamine 100mg  daily - Continue multivitamin with minerals 1 tablet daily - Substance use treatment possible after discharge   HCV - HCV RNA indicates current infection (332,000 IU/mL) - GI consult and followup as an outpatient   Nerve pain (RUE) - Continue gabapentin 400mg  TID   Allergic rhinitis - Continue fluticasone nasal spray 49mcg BID - Continue Singulair 10mg  PO daily - Continue Claritin 10mg  qhs   Diet - Regular diet - Boost feeding supplement daily - Ensure feeding supplement 280ml daily   Dispo - Discussed shelter housing with patient. SW gave resources, patient to call -Patient will need resources for hepatitis C  treatment as outpatient.  Rosezetta Schlatter, MD PGY-1 03/08/2021 [facility] Department of Psychiatry

## 2021-03-08 NOTE — Progress Notes (Signed)
Progress note  Pt found in bed; compliant with medication administration. Pt was provided their second invega injection. Pt had complaints of nicotine withdrawal. Pt was animated throughout the day. Pt did refuse their antidepressant, having worries of withdrawal symptoms if they ever wanted to stop this. Pt is pleasant. Pt denies si/hi/ah/vh and verbally agrees to approach staff if these become apparent or before harming themselves/others while at [facility].  A: Pt provided support and encouragement. Pt given medication per protocol and standing orders. Q76m safety checks implemented and continued.  R: Pt safe on the unit. Will continue to monitor.

## 2021-03-08 NOTE — [facility] Group Notes (Signed)
The focus of this group is to help patients establish daily goals to achieve during treatment and discuss how the patient can incorporate goal setting into their daily lives to aide in recovery.  Pt attended group and had great participation.

## 2021-03-08 NOTE — Plan of Care (Signed)
  Problem: Education: Goal: Emotional status will improve Outcome: Not Progressing Goal: Mental status will improve Outcome: Not Progressing Goal: Verbalization of understanding the information provided will improve Outcome: Not Progressing   

## 2021-03-09 ENCOUNTER — Encounter (HOSPITAL_COMMUNITY): Payer: Self-pay

## 2021-03-09 LAB — VITAMIN B1: Vitamin B1 (Thiamine): 173.5 nmol/L (ref 66.5–200.0)

## 2021-03-09 NOTE — [facility] Group Notes (Signed)
Adult Psychoeducational Group Note  Date:  03/09/2021 Time:  5:21 PM  Group Topic/Focus:  Relaxation    Donell Beers 03/09/2021, 5:21 PM

## 2021-03-09 NOTE — [facility] Group Notes (Signed)
Adult Psychoeducational Group Note  Date:  03/09/2021 Time:  4:36 PM  Group Topic/Focus:  Personal Choices and Values:   The focus of this group is to help patients assess and explore the importance of values in their lives, how their values affect their decisions, how they express their values and what opposes their expression.  Modes of Intervention:  Education  Additional Comments:  Pt received group materials on personal development, choices and values. They were encouraged to fill it out with staff or independently during reflection time.   Deforest Hoyles Fleta Borgeson 03/09/2021, 4:36 PM

## 2021-03-09 NOTE — Group Note (Signed)
Recreation Therapy Group Note   Group Topic:Healthy Decision Making  Group Date: 03/09/2021 Start Time: 0930 End Time: 1015 Facilitators: Caroll Rancher, LRT/CTRS Location: 300 Hall Dayroom  Goal Area(s) Addresses:  Patient will effectively work with peer towards shared goal.  Patient will identify factors that guided their decision making.  Patient will pro-socially communicate ideas during group session.   Group Description: Patients were given a scenario that they were going to be stranded on a deserted [place] for several months before being rescued. Writer tasked them with making a list of 15 things they would choose to bring with them for "survival". The list of items was prioritized most important to least. Each patient would come up with their own list, then work together to create a new list of 15 items while in a group of 3-5 peers. LRT discussed each person's list and how it differed from others. The debrief included discussion of priorities, good decisions versus bad decisions, and how it is important to think before acting so we can make the best decision possible. LRT tied the concept of effective communication among group members to patient's support systems outside of the hospital and its benefit post discharge.   Affect/Mood: Appropriate   Participation Level: Engaged   Participation Quality: Independent   Behavior: Appropriate   Speech/Thought Process: Focused   Insight: Good   Judgement: Good   Modes of Intervention: Group work   Patient Response to Interventions:  Engaged   Education Outcome:  Acknowledges education and In group clarification offered    Clinical Observations/Individualized Feedback: Pt was bright and full of questions during activity.  Pt decided to all weapons to the deserted [place] with him.  Pt decided to take 30 alt 6, ammo, 45 caliber, ammo and a machete.  When challenged about how he would eat, pt explained how he would be able to make  do.  Pt felt all the weapons were sufficient for food and protection.  Pt worked well with coming up with a combined list with peer.  Pt was attentive during processing.    Plan: Continue to engage patient in RT group sessions 2-3x/week.   Caroll Rancher, LRT/CTRS 03/09/2021 12:01 PM

## 2021-03-09 NOTE — BH IP Treatment Plan (Signed)
Interdisciplinary Treatment and Diagnostic Plan Update  03/09/2021 Time of Session: 9:20am  [facility] [facility] MRN: [id number]  Principal Diagnosis: Psychosis, unspecified psychosis type [facility])  Secondary Diagnoses: Principal Problem:   Psychosis, 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HCC) Active Problems:   Delusional disorder (HCC)   Anxious reaction   Chronic post-traumatic stress disorder (PTSD)   Marijuana use, continuous   Current Medications:  Current Facility-Administered Medications  Medication Dose Route Frequency Provider Last Rate Last Admin   acetaminophen (TYLENOL) tablet 650 mg  650 mg Oral Q6H PRN Jaclyn Shaggy, PA-C   650 mg at 03/08/21 1815   alum & mag hydroxide-simeth (MAALOX/MYLANTA) 200-200-20 MG/5ML suspension 30 mL  30 mL Oral Q4H PRN Melbourne Abts W, PA-C       diphenhydrAMINE (BENADRYL) capsule 50 mg  50 mg Oral Q6H PRN Mariel Craft, MD       Or   diphenhydrAMINE (BENADRYL) injection 50 mg  50 mg Intramuscular Q6H PRN Mariel Craft, MD       diphenhydrAMINE (BENADRYL) injection 50 mg  50 mg Intramuscular Q6H PRN Mariel Craft, MD       feeding supplement (BOOST / RESOURCE BREEZE) liquid 1 Container  1 Container Oral Q24H Comer Locket, MD   1 Container at 03/09/21 7564   feeding supplement (ENSURE ENLIVE / ENSURE PLUS) liquid 237 mL  237 mL Oral Q24H Mason Jim, Amy E, MD   237 mL at 03/07/21 2121   fluticasone (FLONASE) 50 MCG/ACT nasal spray 2 spray  2 spray Each Nare BID AC & HS Mariel Craft, MD   2 spray at 03/09/21 0825   gabapentin (NEURONTIN) capsule 400 mg  400 mg Oral TID Phineas Inches, MD   400 mg at 03/09/21 3329   haloperidol (HALDOL) tablet 5 mg  5 mg Oral QHS Lamar Sprinkles, MD   5 mg at 03/08/21 2058   loratadine (CLARITIN) tablet 10 mg  10 mg Oral QHS Mariel Craft, MD   10 mg at 03/08/21 2059   risperiDONE (RISPERDAL M-TABS) disintegrating tablet 1 mg  1 mg Oral Q8H PRN Claudie Revering, MD       And   LORazepam (ATIVAN) tablet 1 mg   1 mg Oral PRN Claudie Revering, MD       And   ziprasidone (GEODON) injection 20 mg  20 mg Intramuscular PRN Claudie Revering, MD       magnesium hydroxide (MILK OF MAGNESIA) suspension 30 mL  30 mL Oral Daily PRN Melbourne Abts W, PA-C       montelukast (SINGULAIR) tablet 10 mg  10 mg Oral Theodora Blow, MD   10 mg at 03/09/21 0825   multivitamin with minerals tablet 1 tablet  1 tablet Oral Daily Claudie Revering, MD   1 tablet at 03/09/21 0825   nicotine polacrilex (NICORETTE) gum 2 mg  2 mg Oral PRN Jaclyn Shaggy, PA-C   2 mg at 03/09/21 0827   [START ON 04/05/2021] paliperidone (INVEGA SUSTENNA) injection 234 mg  234 mg Intramuscular Once Mariel Craft, MD       thiamine tablet 100 mg  100 mg Oral Daily Claudie Revering, MD   100 mg at 03/09/21 5188   PTA Medications: Medications Prior to Admission  Medication Sig Dispense Refill Last Dose   busPIRone (BUSPAR) 10 MG tablet Take 1 tablet (10 mg total) by mouth 3 (three) times daily. (Patient taking differently: Take 10 mg by mouth  at bedtime.) 90 tablet 2 Past Month    Patient Stressors: Marital or family conflict    Patient Strengths: Motivation for treatment/growth   Treatment Modalities: Medication Management, Group therapy, Case management,  1 to 1 session with clinician, Psychoeducation, Recreational therapy.   Physician Treatment Plan for Primary Diagnosis: Psychosis, 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HCC) Long Term Goal(s): Improvement in symptoms so as ready for discharge   Short Term Goals: Ability to identify changes in lifestyle to reduce recurrence of condition will improve Ability to verbalize feelings will improve Ability to disclose and discuss suicidal ideas Ability to demonstrate self-control will improve Ability to identify and develop effective coping behaviors will improve Ability to maintain clinical measurements within normal limits will improve Compliance with prescribed medications will improve Ability  to identify triggers associated with substance abuse/mental health issues will improve  Medication Management: Evaluate patient's response, side effects, and tolerance of medication regimen.  Therapeutic Interventions: 1 to 1 sessions, Unit Group sessions and Medication administration.  Evaluation of Outcomes: Progressing  Physician Treatment Plan for Secondary Diagnosis: Principal Problem:   Psychosis, 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HCC) Active Problems:   Delusional disorder (HCC)   Anxious reaction   Chronic post-traumatic stress disorder (PTSD)   Marijuana use, continuous  Long Term Goal(s): Improvement in symptoms so as ready for discharge   Short Term Goals: Ability to identify changes in lifestyle to reduce recurrence of condition will improve Ability to verbalize feelings will improve Ability to disclose and discuss suicidal ideas Ability to demonstrate self-control will improve Ability to identify and develop effective coping behaviors will improve Ability to maintain clinical measurements within normal limits will improve Compliance with prescribed medications will improve Ability to identify triggers associated with substance abuse/mental health issues will improve     Medication Management: Evaluate patient's response, side effects, and tolerance of medication regimen.  Therapeutic Interventions: 1 to 1 sessions, Unit Group sessions and Medication administration.  Evaluation of Outcomes: Progressing   RN Treatment Plan for Primary Diagnosis: Psychosis, 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HCC) Long Term Goal(s): Knowledge of disease and therapeutic regimen to maintain health will improve  Short Term Goals: Ability to remain free from injury will improve, Ability to participate in decision making will improve, Ability to verbalize feelings will improve, Ability to disclose and discuss suicidal ideas, and Ability to identify and develop effective coping behaviors will  improve  Medication Management: RN will administer medications as ordered by provider, will assess and evaluate patient's response and provide education to patient for prescribed medication. RN will report any adverse and/or side effects to prescribing provider.  Therapeutic Interventions: 1 on 1 counseling sessions, Psychoeducation, Medication administration, Evaluate responses to treatment, Monitor vital signs and CBGs as ordered, Perform/monitor CIWA, COWS, AIMS and Fall Risk screenings as ordered, Perform wound care treatments as ordered.  Evaluation of Outcomes: Progressing   LCSW Treatment Plan for Primary Diagnosis: Psychosis, 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HCC) Long Term Goal(s): Safe transition to appropriate next level of care at discharge, Engage patient in therapeutic group addressing interpersonal concerns.  Short Term Goals: Engage patient in aftercare planning with referrals and resources, Increase social support, Increase emotional regulation, Facilitate acceptance of mental health diagnosis and concerns, Identify triggers associated with mental health/substance abuse issues, and Increase skills for wellness and recovery  Therapeutic Interventions: Assess for all discharge needs, 1 to 1 time with Social worker, Explore available resources and support systems, Assess for adequacy in community support network, Educate family and significant other(s) on suicide prevention, Complete Psychosocial  Assessment, Interpersonal group therapy.  Evaluation of Outcomes: Progressing   Progress in Treatment: Attending groups: Yes. Participating in groups: Yes. Taking medication as prescribed: Yes. Toleration medication: Yes. Family/Significant other contact made: Yes, individual(s) contacted:  Father  Patient understands diagnosis: No. Discussing patient identified problems/goals with staff: Yes. Medical problems stabilized or resolved: Yes. Denies suicidal/homicidal ideation:  Yes. Issues/concerns per patient self-inventory: No.   New problem(s) identified: No, Describe:  None   New Short Term/Long Term Goal(s): medication stabilization, elimination of SI thoughts, development of comprehensive mental wellness plan.   Patient Goals: Did not attend/Update   Discharge Plan or Barriers: Pt will return to a local shelter and will follow-up with [facility] in [place]   Reason for Continuation of Hospitalization: Delusions  Medication stabilization  Estimated Length of Stay: 3 to 5 days    Scribe for Treatment Team: Aram Beecham, Theresia Majors 03/09/2021 10:49 AM

## 2021-03-09 NOTE — Progress Notes (Signed)
Pt stated he was doing ok, pt concerned where he will do at D/C. Pt would like to go to [facility]. Pt encouraged to talk to the SW.    03/09/21 2100  Psych Admission Type (Psych Patients Only)  Admission Status Voluntary  Psychosocial Assessment  Patient Complaints None  Eye Contact Fair  Facial Expression Anxious  Affect Appropriate to circumstance  Speech Logical/coherent  Interaction Assertive  Motor Activity Slow  Appearance/Hygiene Unremarkable  Behavior Characteristics Cooperative  Mood Anxious;Pleasant  Aggressive Behavior  Effect No apparent injury  Thought Process  Coherency WDL  Content WDL  Delusions None reported or observed  Perception WDL  Hallucination None reported or observed  Judgment WDL  Danger to Self  Current suicidal ideation? Denies  Danger to Others  Danger to Others None reported or observed

## 2021-03-09 NOTE — [facility] Group Notes (Signed)
[facility] Group Notes:  (Nursing/MHT/Case Management/Adjunct)  Date:  03/09/2021  Time:  8:32 PM  Type of Therapy:   NA Group  Participation Level:  Minimal  Participation Quality:  Attentive  Affect:  Appropriate  Cognitive:  Appropriate  Insight:  Appropriate  Engagement in Group:  Developing/Improving  Modes of Intervention:  Discussion  Summary of Progress/Problems: PT is doing well. PT didn't participate in group but was very attentive.   Lorita Officer 03/09/2021, 8:32 PM

## 2021-03-09 NOTE — Group Note (Signed)
[facility] LCSW Group Therapy Note   Group Date: 03/09/2021 Start Time: 1300 End Time: 1345   Type of Therapy/Topic:  Group Therapy:  Balance in Life  Participation Level:  Active   Description of Group:    This group will address the concept of balance and how it feels and looks when one is unbalanced. Patients will be encouraged to process areas in their lives that are out of balance, and identify reasons for remaining unbalanced. Facilitators will guide patients utilizing problem- solving interventions to address and correct the stressor making their life unbalanced. Understanding and applying boundaries will be explored and addressed for obtaining  and maintaining a balanced life. Patients will be encouraged to explore ways to assertively make their unbalanced needs known to significant others in their lives, using other group members and facilitator for support and feedback.  Therapeutic Goals: Patient will identify two or more emotions or situations they have that consume much of in their lives. Patient will identify signs/triggers that life has become out of balance:  Patient will identify two ways to set boundaries in order to achieve balance in their lives:  Patient will demonstrate ability to communicate their needs through discussion and/or role plays    Therapeutic Modalities:   Cognitive Behavioral Therapy Solution-Focused Therapy Assertiveness Training   Amamda Curbow M Karnell Vanderloop, LCSWA 

## 2021-03-09 NOTE — Plan of Care (Signed)
  Problem: Education: Goal: Knowledge of [facility] Education information/materials will improve Outcome: Progressing Goal: Emotional status will improve Outcome: Progressing Goal: Mental status will improve Outcome: Progressing Goal: Verbalization of understanding the information provided will improve Outcome: Progressing   Problem: Activity: Goal: Interest or engagement in activities will improve Outcome: Progressing Goal: Sleeping patterns will improve Outcome: Progressing   Problem: Coping: Goal: Ability to verbalize frustrations and anger appropriately will improve Outcome: Progressing Goal: Ability to demonstrate self-control will improve Outcome: Progressing   Problem: Health Behavior/Discharge Planning: Goal: Identification of resources available to assist in meeting health care needs will improve Outcome: Progressing Goal: Compliance with treatment plan for underlying cause of condition will improve Outcome: Progressing   Problem: Physical Regulation: Goal: Ability to maintain clinical measurements within normal limits will improve Outcome: Progressing   Problem: Safety: Goal: Periods of time without injury will increase Outcome: Progressing   Problem: Activity: Goal: Will verbalize the importance of balancing activity with adequate rest periods Outcome: Progressing   Problem: Physical Regulation: Goal: Ability to maintain clinical measurements within normal limits will improve Outcome: Progressing

## 2021-03-09 NOTE — Progress Notes (Signed)
[facility] MD Progress Note  03/09/2021 5:27 PM Jeffery Baldwin  MRN:  [id number]  Subjective:  Jeffery Baldwin is a 30 year old male with a psychiatric history of ADHD, anxiety, MDD, substance abuse and history of paranoia who was admitted with worsening psychotic symptoms and suicidal ideation in the context of substance use (THC, Kratom, tramadol, Klonopin).    On assessment today (10/5): Case was discussed in the multidisciplinary team. MAR was reviewed. He got the B12 shot and the second invega.   On interview, the patient complains of feeling "too tired" today. He believes it is related to the haldol from last night. He has no current means of contacting family or friends. He usually rides around their neighborhoods until he finds them. He did make some progress on partial disabililty application today. 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 He denies hallucinations, but still has the fixed delusions. He is less guarded about them lately, but this may represent less defensiveness than softening.   Principal Problem: Psychosis, 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[place]) Diagnosis: Principal Problem:   Psychosis, 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[place]) Active Problems:   Delusional disorder ([place])   Anxious reaction   Chronic post-traumatic stress disorder (PTSD)   Marijuana use, continuous  Total Time Spent in Direct Patient Care:  I personally spent 15 minutes on the unit in direct patient care. The direct patient care time included face-to-face time with the patient, reviewing the patient's chart, communicating with other professionals, and coordinating care. Greater than 50% of this time was spent in counseling or coordinating care with the patient regarding goals of hospitalization, psycho-education, and discharge planning needs.  Past Psychiatric History: See H&P  Past Medical History:  Past Medical History:  Diagnosis Date   Chronic pain syndrome    Heroin abuse ([place])    Nerve root avulsion     Spinal cord injury at C5-C7 level without injury of spinal bone ([place]) 2011    Past Surgical History:  Procedure Laterality Date   nerve root repair     Family History: see H&P  Family Psychiatric  History: See H&P  Social History:  Social History   Substance and Sexual Activity  Alcohol Use Yes   Alcohol/week: 0.0 standard drinks   Comment: occasional     Social History   Substance and Sexual Activity  Drug Use Yes   Comment: heroine    Social History   Socioeconomic History   Marital status: Single    Spouse name: Not on file   Number of children: Not on file   Years of education: Not on file   Highest education level: Not on file  Occupational History   Not on file  Tobacco Use   Smoking status: Some Days    Packs/day: 0.20    Years: 7.00    Pack years: 1.40    Types: Cigarettes   Smokeless tobacco: Never   Tobacco comments:    decreased smoking  Substance and Sexual Activity   Alcohol use: Yes    Alcohol/week: 0.0 standard drinks    Comment: occasional   Drug use: Yes    Comment: heroine   Sexual activity: Yes  Other Topics Concern   Not on file  Social History Narrative   ** Merged History Encounter **       Social Determinants of Health   Financial Resource Strain: Not on file  Food Insecurity: Not on file  Transportation Needs: Not on file  Physical Activity: Not on  file  Stress: Not on file  Social Connections: Not on file   Sleep: Fair  Appetite:  Fair  Current Medications: Current Facility-Administered Medications  Medication Dose Route Frequency Provider Last Rate Last Admin   acetaminophen (TYLENOL) tablet 650 mg  650 mg Oral Q6H PRN Prescilla Sours, PA-C   650 mg at 03/09/21 1200   alum & mag hydroxide-simeth (MAALOX/MYLANTA) 200-200-20 MG/5ML suspension 30 mL  30 mL Oral Q4H PRN Margorie John W, PA-C       diphenhydrAMINE (BENADRYL) capsule 50 mg  50 mg Oral Q6H PRN Lavella Hammock, MD       Or   diphenhydrAMINE (BENADRYL)  injection 50 mg  50 mg Intramuscular Q6H PRN Lavella Hammock, MD       diphenhydrAMINE (BENADRYL) injection 50 mg  50 mg Intramuscular Q6H PRN Lavella Hammock, MD       feeding supplement (BOOST / RESOURCE BREEZE) liquid 1 Container  1 Container Oral Q24H Harlow Asa, MD   1 Container at 03/09/21 3818   feeding supplement (ENSURE ENLIVE / ENSURE PLUS) liquid 237 mL  237 mL Oral Q24H Nelda Marseille, Amy E, MD   237 mL at 03/07/21 2121   fluticasone (FLONASE) 50 MCG/ACT nasal spray 2 spray  2 spray Each Nare BID AC & HS Lavella Hammock, MD   2 spray at 03/09/21 0825   gabapentin (NEURONTIN) capsule 400 mg  400 mg Oral TID Janine Limbo, MD   400 mg at 03/09/21 1357   haloperidol (HALDOL) tablet 5 mg  5 mg Oral QHS Rosezetta Schlatter, MD   5 mg at 03/08/21 2058   loratadine (CLARITIN) tablet 10 mg  10 mg Oral QHS Lavella Hammock, MD   10 mg at 03/08/21 2059   risperiDONE (RISPERDAL M-TABS) disintegrating tablet 1 mg  1 mg Oral Q8H PRN Arthor Captain, MD   1 mg at 03/09/21 1629   And   LORazepam (ATIVAN) tablet 1 mg  1 mg Oral PRN Arthor Captain, MD       And   ziprasidone (GEODON) injection 20 mg  20 mg Intramuscular PRN Arthor Captain, MD       magnesium hydroxide (MILK OF MAGNESIA) suspension 30 mL  30 mL Oral Daily PRN Margorie John W, PA-C       montelukast (SINGULAIR) tablet 10 mg  10 mg Oral Doroteo Bradford, MD   10 mg at 03/09/21 0825   multivitamin with minerals tablet 1 tablet  1 tablet Oral Daily Arthor Captain, MD   1 tablet at 03/09/21 0825   nicotine polacrilex (NICORETTE) gum 2 mg  2 mg Oral PRN Prescilla Sours, PA-C   2 mg at 03/09/21 1631   [START ON 04/05/2021] paliperidone (INVEGA SUSTENNA) injection 234 mg  234 mg Intramuscular Once Lavella Hammock, MD       thiamine tablet 100 mg  100 mg Oral Daily Arthor Captain, MD   100 mg at 03/09/21 2993    Lab Results:  No results found for this or any previous visit (from the past 45 hour(s)).   Blood Alcohol level:   Lab Results  Component Value Date   [facility] <10 02/22/2021   ETH <10 71/69/6789    Metabolic Disorder Labs: Lab Results  Component Value Date   HGBA1C 4.9 02/22/2021   MPG 93.93 02/22/2021   No results found for: PROLACTIN Lab Results  Component Value Date   CHOL 166  02/22/2021   TRIG 42 02/22/2021   HDL 93 02/22/2021   CHOLHDL 1.8 02/22/2021   VLDL 8 02/22/2021   LDLCALC 65 02/22/2021    Physical Findings: AIMS: Facial and Oral Movements Muscles of Facial Expression: None, normal Lips and Perioral Area: None, normal Jaw: None, normal Tongue: None, normal,Extremity Movements Upper (arms, wrists, hands, fingers): None, normal Lower (legs, knees, ankles, toes): None, normal, Trunk Movements Neck, shoulders, hips: None, normal, Overall Severity Severity of abnormal movements (highest score from questions above): None, normal Incapacitation due to abnormal movements: None, normal Patient's awareness of abnormal movements (rate only patient's report): No Awareness, Dental Status Current problems with teeth and/or dentures?: No Does patient usually wear dentures?: No  CIWA:  CIWA-Ar Total: 0    Musculoskeletal: Strength & Muscle Tone: within normal limits for patient, right arm remains flexed at the elbow Gait & Station: normal, steady Patient leans: N/A  Psychiatric Specialty Exam:  Presentation  General Appearance: casually dressed, adequate hygiene  Eye Contact:Fleeting (Shifted gaze to each of the members of our team.)  Speech:Clear and Coherent; Normal Rate  Speech Volume:Normal  Handedness:-- (Not assessed)   Mood and Affect  Mood: described as "ok" but appears aloof  Affect: guarded, constricted  Thought Process  Thought Processes: linear but concrete  Orientation:Full (Time, Place and Person)  Thought Content: He no longer appears paranoid nor guarded on exam. He is not grossly responding to internal/external stimuli during assessment; he has  residual delusion that his mother is not deceased and about omens which appears fixed in nature but no longer distressing; he denies AVH; He denies current ideas of reference or first rank symptoms  History of Schizophrenia/Schizoaffective disorder:No  Duration of Psychotic Symptoms:Greater than six months  Hallucinations:Denied  Ideas of Reference:Denies today  Suicidal Thoughts:No data recorded Denies  Homicidal Thoughts:No data recorded Denies   Sensorium  Memory:Immediate Good; Recent Fair; Remote Fair  [place] to have his distress treated.  Insight:Shallow   Executive Functions  Concentration:Good  Attention Span:Good  [place]   Psychomotor Activity  Psychomotor Activity:Right UE with limited mobility and flexed at elbow; AIMS 0; no tremor, cogwheeling, or stiffness of LUE on exam; steady gait  Assets  Assets:Resilience  Sleep  Sleep:6.75 hours  Physical Exam HENT:     Head: Normocephalic and atraumatic.     Nose: Nose normal.  Eyes:     Extraocular Movements: Extraocular movements intact.  Pulmonary:     Effort: Pulmonary effort is normal.  Musculoskeletal:     Comments: ROM baseline for patient  Neurological:     Mental Status: He is alert and oriented to person, place, and time.   Review of Systems  Respiratory:  Negative for shortness of breath.   Cardiovascular:  Negative for chest pain.  Gastrointestinal:  Negative for constipation, diarrhea, nausea and vomiting.  Psychiatric/Behavioral:  Negative for hallucinations and suicidal ideas.   Blood pressure 110/77, pulse 85, temperature 98 F (36.7 C), temperature source Oral, resp. rate 16, height $RemoveBe'5\' 11"'nKmoHqdfv$  (1.803 m), weight 64.6 kg, SpO2 99 %. Body mass index is 19.87 kg/m.   Treatment Plan Summary: Daily contact with patient to assess and evaluate symptoms and progress in treatment and Medication management  The patient is a  29 year old male with prior diagnoses of ADHD, anxiety, MDD, substance abuse and history of paranoia who was admitted with worsening psychotic symptoms and suicidal ideation in the context of substance use (THC, Kratom, Tramadol, and Klonopin).  He appears significantly improved from yesterday in terms of residual delusions, less paranoid appearing, and no first rank sx. Physically patient does not appear to be having any negative side effects from his LAI, and he appears to be clinically improving with the addition of Haldol.  Patient continues to require hospitalization for safe disposition planning.  Unspecified schizophrenia spectrum and other psychotic d/o  (r/o schizophrenia, r/o delusional d/o, r/o MDD with psychosis, r/o schizoaffective d/o, r/o substance induce psychosis) - Invega Sustenna 234mg  IM (03/04/2021). Received 156mg  IM yesterday, and 234mg  IM on 11/1  - QTC 397 on recheck 10/2 monitoring with loading of Invega  -Continue Haldol 5 mg nightly. Delusions appear to be stable and fixed, but no longer distressing. Previously held delusions, especially longstanding ones, may remain as part of encoded memory regardless of therapeutic antipsychotic level.  - Continue Diphenhydramine 50mg  IM q6h PRN for acute dystonia. No EPS on exam First psychotic break additional labs reviewed: Vitamin D-normal, iron and TIBC-WNL, ferritin-WNL, ammonia-WNL, ESR-WNL, ANA-WNL, RPR-negative, HIV-nonreactive, vitamin B12-308.Head CT noncontrast- negative Heavy metal, ceruloplasmin, serum copper, B1--pending,  Additional labs reviewed: HgbA1c-4.9, TSH-0.503, lipid panel-WNL, CBC-WNL W/exception Hgb 17.5 (elevated Hgb can be seen with nicotine use), CMP-ALT elevated (58), albumin elevated (5.3), hepatitis panel-HCV antibody reactive,EtOH-WNL -B12 injection 1000 mcg IM given, per patient request.   PTSD -Discontinue Zoloft 100 mg daily, per patient request.  Advised that he can work with outpatient psychiatrist  after discharge to determine the need for and to adjust medications; patient agreeable.  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 (Tramadol, Roxicodone) Tobacco use d/o - Discontinued CIWA protocol based on scores and no signs of withdrawal - Continue Nicorette gum 2mg  - Continue thiamine 100mg  daily - Continue multivitamin with minerals 1 tablet daily - Substance use treatment possible after discharge   HCV - HCV RNA indicates current infection (332,000 IU/mL) - GI consult and followup as an outpatient   Nerve pain (RUE) - Continue gabapentin 400mg  TID   Allergic rhinitis - Continue fluticasone nasal spray 61mcg BID - Continue Singulair 10mg  PO daily - Continue Claritin 10mg  qhs   Diet - Regular diet - Boost feeding supplement daily - Ensure feeding supplement 235ml daily   Dispo - Discussed shelter housing with patient. SW gave resources, patient to call -Patient will need resources for hepatitis C treatment as outpatient.  Maida Sale, MD  03/09/2021 [place] Department of Psychiatry

## 2021-03-09 NOTE — [facility] Group Notes (Signed)
The focus of this group is to help patients establish daily goals to achieve during treatment and discuss how the patient can incorporate goal setting into their daily lives to aide in recovery.  Pt did not attend group 

## 2021-03-09 NOTE — Progress Notes (Signed)
Daily Progress Note  D: Denies SI/HI/AH/VH. Rates pain 5/10. Takes medication as prescribed.Rates anxiety upon awakening and going to bed as the highest 5/10. Informs RN anxiety is reduced when walking around.   A: Labs and vital signs monitored. Patient supported emotionally and encouraged to verbalize concerns. All concerns addressed this shift regarding pain and prn request.  R: No adverse reaction to medication noted. Cont Q15 minute check for safety.

## 2021-03-10 LAB — POC SARS CORONAVIRUS 2 AG: SARSCOV2ONAVIRUS 2 AG: NEGATIVE

## 2021-03-10 MED ORDER — FLUTICASONE PROPIONATE 50 MCG/ACT NA SUSP: 2.0000 | Freq: Two times a day (BID) | NASAL | 0 refills | Status: AC

## 2021-03-10 MED ORDER — GABAPENTIN 400 MG PO CAPS: 400.0000 mg | ORAL_CAPSULE | Freq: Three times a day (TID) | ORAL | 0 refills | Status: AC

## 2021-03-10 MED ORDER — HALOPERIDOL 5 MG PO TABS: 5.0000 mg | ORAL_TABLET | Freq: Every day | ORAL | 0 refills | Status: AC

## 2021-03-10 MED ORDER — LORATADINE 10 MG PO TABS: 10.0000 mg | ORAL_TABLET | Freq: Every day | ORAL | 0 refills | Status: DC

## 2021-03-10 MED ORDER — LORATADINE 10 MG PO TABS: 10.0000 mg | ORAL_TABLET | Freq: Every day | ORAL | 0 refills | Status: AC

## 2021-03-10 MED ORDER — MONTELUKAST SODIUM 10 MG PO TABS: 10.0000 mg | ORAL_TABLET | ORAL | 0 refills | Status: DC

## 2021-03-10 MED ORDER — GABAPENTIN 400 MG PO CAPS: 400.0000 mg | ORAL_CAPSULE | Freq: Three times a day (TID) | ORAL | 0 refills | Status: DC

## 2021-03-10 MED ORDER — MONTELUKAST SODIUM 10 MG PO TABS: 10.0000 mg | ORAL_TABLET | ORAL | 0 refills | Status: AC

## 2021-03-10 MED ORDER — HALOPERIDOL 5 MG PO TABS: 5.0000 mg | ORAL_TABLET | Freq: Every day | ORAL | 0 refills | Status: DC

## 2021-03-10 MED ORDER — FLUTICASONE PROPIONATE 50 MCG/ACT NA SUSP: 2.0000 | Freq: Two times a day (BID) | NASAL | 0 refills | Status: DC

## 2021-03-10 MED ORDER — PALIPERIDONE PALMITATE ER 234 MG/1.5ML IM SUSY: 234.0000 mg | PREFILLED_SYRINGE | Freq: Once | INTRAMUSCULAR | 0 refills | Status: AC

## 2021-03-10 MED ORDER — NICOTINE POLACRILEX 2 MG MT GUM: 2.0000 mg | CHEWING_GUM | OROMUCOSAL | 0 refills | Status: AC | PRN

## 2021-03-10 MED ORDER — HYDROXYZINE HCL 25 MG PO TABS: 25.0000 mg | ORAL_TABLET | Freq: Three times a day (TID) | ORAL | Status: DC | PRN

## 2021-03-10 MED ORDER — PALIPERIDONE PALMITATE ER 234 MG/1.5ML IM SUSY: 234.0000 mg | PREFILLED_SYRINGE | Freq: Once | INTRAMUSCULAR | 0 refills | Status: DC

## 2021-03-10 NOTE — [facility] Group Notes (Signed)
Did not attend the progressive muscle relaxation group. 

## 2021-03-10 NOTE — Plan of Care (Signed)
Patient was able to identify coping skills at completion of recreation therapy group sessions.   Rody Keadle, LRT/CTRS 

## 2021-03-10 NOTE — Progress Notes (Signed)
  [facility] Adult Case Management Discharge Plan :  Will you be returning to the same living situation after discharge:  No. Will be discharging to shelter At discharge, do you have transportation home?: No. Safe Transport will be arranged  Do you have the ability to pay for your medications: No. Samples to be provided at discharge  Release of information consent forms completed and in the chart;  Patient's signature needed at discharge.  Patient to Follow up at:  Follow-up Information     [facility]. Schedule an appointment as soon as possible for a visit.   Why: Please call to schedule an appointment with this provider for primary care services as soon as possible. Contact information: [address], [address] [place] [address]        [facility] Follow up.   Why: You have an appointment scheduled on 03/18/2021 at 1:15pm for medication management and therapy. Bring ID and social security card. Contact information: [address],  [place] [address] Hours: Mon-Fri: 8AM to 5PM  Phone: [phone] Fax: [phone]                Next level of care provider has access to [facility] Link:no  Safety Planning and Suicide Prevention discussed: Yes,  with pt     Has patient been referred to the Quitline?: N/A patient is not a smoker  Patient has been referred for addiction treatment: Pt. refused referral  Otelia Santee, LCSW 03/10/2021, 10:05 AM

## 2021-03-10 NOTE — Discharge Summary (Addendum)
Physician Discharge Summary Note  Patient:  Jeffery Baldwin is an 30 y.o., male MRN:  [id number] DOB:  1991-03-14 Patient phone:  [phone] (home)  Patient address:   [place] [address],  Total Time spent with patient: 30 minutes  Date of Admission:  02/23/2021 Date of Discharge: 03/10/2021  Reason for Admission:   Per H&P- Medical record reviewed.  Patient's case discussed in detail with nursing staff and members of the treatment team.  I met with and evaluated the patient on the unit today.  Jeffery Baldwin is a 30 year old male with history of prior treatment for ADHD, MDD, GAD, paranoia and history of substance abuse including remote history of heroin use disorder who presented to [facility] saying he needed to be committed due to feeling he is being "gaslighted, tortured and trust broken" and presenting with disorganized thought processes, paranoid delusions, trouble sleeping and suicidal ideation.  Patient made statements about malicious codes having been embedded on his electronic devices and reported that he had been replaying/reliving scenarios or events from his life.  Staff who evaluated him at [facility] documented that he appeared to be responding to internal stimuli although he denied AH or VH at that time.  Patient reportedly was given olanzapine 5 mg, trazodone 50 mg and Vistaril 25 mg last night and was transferred to [facility] early this morning for further evaluation and treatment.  On interview with me today, patient is guarded, anxious and disorganized with constricted affect.  He is a poor historian.  Patient says that he feels like he is being psychologically tortured.  He reports he has been living some of the same events over again for about the past 20 months and notes that he is seeing the same clouds, the same weather, having the same conversations, etc.  Patient tells me that he feels like he is moving back in time, is living out other people's experiences after he has  conversations with them and feels outcomes in his life are being controlled.  He reports paranoid concerns that he is being watched and that his phone and accounts have been hacked.  Patient states he believes that he is being targeted "by Mexicans" for financial gain and possibly other reasons.  He reports belief that he has been exposed to scopolamine in recent weeks "because it makes you open to suggestion" and also believes he was being given an antidepressant prior to admission without his knowledge.  Patient endorses auditory hallucinations (of people repeating what he says), thought broadcasting, anxiety, trouble focusing, getting sidetracked, insomnia, sad mood, decreased appetite with weight loss, nonspecific thoughts of harming others (no target or plan), and worsening nonspecific suicidal ideation due to feeling overwhelmed by his recent experiences (no intent or plan).  Patient denies thoughts of harming himself in the hospital.  Patient denies taking any psychiatric medications prior to admission.  Patient says that he drank some alcohol prior to admission but denies consistent or frequent alcohol use or history of withdrawal symptoms.  Patient reports using marijuana on the day prior to admission but denies regular use.  He reports use of delta 8 and CBD in the past but not in the last 1 or 2 months.  He has used cocaine in the past but denies use in recent months.  Patient says that he has a remote history of IV drug use of heroin but has not been using heroin recently.  He does report taking tramadol and Klonopin that are not prescribed for him during  the month prior to current hospitalization.  Patient has difficulty quantifying the amounts of tramadol and Klonopin taken.  He estimates that he was taking between 2 and 4 Klonopin pills most days and says they are the smallest available dose.  He states that he was taking up to 5 pills of tramadol per day.  Patient denies current diaphoresis,  tremor, nausea, vomiting, increased heart rate, diarrhea, pain or other withdrawal symptoms.  Patient says he has paralysis of right arm resulting from torn nerve roots in a football accident in October 2010.  He denies any history of seizure, concussion, asthma, diabetes or other medical problems.  Labs: CMP showed BUN less than 5, albumin 5.3, ALT 58 and otherwise WNL.  Lipid profile was WNL.  CBC and differential showed hemoglobin 17.5 and otherwise WNL.  Hemoglobin A1c was 4.9.  TSH was 0.503.  RPR was nonreactive.  Influenza A, influenza B and coronavirus testing were negative.  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  HIV screen was nonreactive.  BAL was <10 and urine tox cream was positive for marijuana.     EKG performed evening of 02/22/2021 showed sinus bradycardia, ventricular rate 58 and QT/QTc 402/394.    Principal Problem: Psychosis, unspecified psychosis type [facility]) Discharge Diagnoses: Principal Problem:   Psychosis, 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[place]) Active Problems:   Delusional disorder ([place])   Anxious reaction   Chronic post-traumatic stress disorder (PTSD)   Marijuana use, continuous   Past Psychiatric History: Patient says he is uncertain whether he was seeing a psychiatrist in recent months.  Chart review indicates that patient was seen by Burt Ek with most recent visit on 12/07/2020 for medication management for social anxiety, substance use and generalized anxiety.  He was prescribed BuSpar and Strattera but was not adherent to his outpatient medication regimen.  Outpatient clinic notes also document that patient was taking Klonopin and tramadol which he obtained from a friend and has a history of misusing Roxicodone.  At the July outpatient visit patient made paranoid statements about his phone being tapped and being gaslighted but declined to start an antipsychotic.  Patient denies prior history of inpatient psychiatric  hospitalization.  He says he attempted suicide once about 5 years ago by hanging himself in the garage but changed his mind and did not follow through with it.  Patient says he does not know whether he has been given a psychiatric diagnosis in the past.  Past Medical History:  Past Medical History:  Diagnosis Date   Chronic pain syndrome    Heroin abuse ([place])    Nerve root avulsion    Spinal cord injury at C5-C7 level without injury of spinal bone ([place]) 2011    Past Surgical History:  Procedure Laterality Date   nerve root repair     Family History: History reviewed. No pertinent family history. Family Psychiatric  History: Patient says that there are members of his family with mental health problems related to trauma.  He says there are some family members with drug and alcohol problems.  Patient says there is no family history of suicide. Social History:  Social History   Substance and Sexual Activity  Alcohol Use Yes   Alcohol/week: 0.0 standard drinks   Comment: occasional     Social History   Substance and Sexual Activity  Drug Use Yes   Comment: heroine    Social History   Socioeconomic History   Marital status: Single  Spouse name: Not on file   Number of children: Not on file   Years of education: Not on file   Highest education level: Not on file  Occupational History   Not on file  Tobacco Use   Smoking status: Some Days    Packs/day: 0.20    Years: 7.00    Pack years: 1.40    Types: Cigarettes   Smokeless tobacco: Never   Tobacco comments:    decreased smoking  Substance and Sexual Activity   Alcohol use: Yes    Alcohol/week: 0.0 standard drinks    Comment: occasional   Drug use: Yes    Comment: heroine   Sexual activity: Yes  Other Topics Concern   Not on file  Social History Narrative   ** Merged History Encounter **       Social Determinants of Health   Financial Resource Strain: Not on file  Food Insecurity: Not on file   Transportation Needs: Not on file  Physical Activity: Not on file  Stress: Not on file  Social Connections: Not on file    Hospital Course:  After the above admission evaluation, Jeffery Baldwin's presenting symptoms were noted. He was recommended for mood stabilization treatments. The medication regimen targeting those presenting symptoms were discussed with him & initiated with his consent. He was trialed on Zyprexa 5 mg qAM and 10 mg qHS for his psychosis but unable to tolerate it d/t fatigue, so he was switched to Risperdal 1 mg qAM and 2 mg qHS (up titrated to 6 mg qHS). He was also initiated on [place] 234 mg IM on 9/30, then 156 mg IM on 10/4; he is due for his next 234 mg IM injection on 11/1. The Risperdal was decreased then discontinued and switched to Haldol 5 mg PO qHS with initiation of the Invega LAI.  For mood symptoms and PTSD, Zoloft 25 mg was initiated and titrated up to 100 mg before discontinuing d/t patient preference.  CT Head was ordered in new-onset psychosis workup: His UDS on arrival to the ED was pos THC, BAL neg. He was however medicated, stabilized & discharged on the medications as listed on his discharge medication lists below. Besides the mood stabilization treatments, Jeffery Baldwin was also enrolled & participated in the group counseling sessions being offered & held on this unit. He learned coping skills. He presented with the pre-existing medical issues of RUE nerve pain 2/2 nerve root injury requiring Gabapentin 300 mg TID (titrated up to 400 mg TID) and Allergic rhinitis requiring Fluticasone nasal spray, Singulair 10 mg daily, and Claritin 10 mg qHS. He was advised to visit his PCP to follow-up on his positive Hep C result. He tolerated his treatment regimen without any adverse effects or reactions reported.   During the course of his hospitalization, the 15-minute checks were adequate to ensure patient's safety. Jeffery Baldwin did not display any dangerous, violent or suicidal  behavior on the unit.  He interacted with patients & staff appropriately, participated appropriately in the group sessions/therapies. His medications were addressed & adjusted to meet his needs. He was recommended for outpatient follow-up care & medication management upon discharge to assure continuity of care & mood stability.  At the time of discharge patient is not reporting any acute suicidal/homicidal ideations. He feels more confident about his self-care & in managing his mental health. He currently denies any new issues or concerns. Education and supportive counseling provided throughout his hospital stay & upon discharge.   Today upon his  discharge evaluation with the attending psychiatrist, Jeffery Baldwin shares he is doing well. He denies any other specific concerns. He is sleeping well. His appetite is good. He denies other physical complaints. He endorses fixed delusional thoughts but no longer feels distressed by them. He denies AH/VH and paranoia. He does not appear to be responding to any internal stimuli. He feels that his medications have been helpful & is in agreement to continue his current treatment regimen as recommended. He was able to engage in safety planning including plan to return to [facility] or contact emergency services if he feels unable to maintain his own safety or the safety of others. Pt had no further questions, comments, or concerns. He left [facility] with all personal belongings in no apparent distress. Transportation per [employer].    Physical Findings: AIMS: Facial and Oral Movements Muscles of Facial Expression: None, normal Lips and Perioral Area: None, normal Jaw: None, normal Tongue: None, normal,Extremity Movements Upper (arms, wrists, hands, fingers): None, normal Lower (legs, knees, ankles, toes): None, normal, Trunk Movements Neck, shoulders, hips: None, normal, Overall Severity Severity of abnormal movements (highest score from questions above): None,  normal Incapacitation due to abnormal movements: None, normal Patient's awareness of abnormal movements (rate only patient's report): No Awareness, Dental Status Current problems with teeth and/or dentures?: No Does patient usually wear dentures?: No  CIWA:  CIWA-Ar Total: 0   Musculoskeletal: Strength & Muscle Tone: within normal limits and WNL L arm; R arm held in a fixed position 2/2 paralysis and nerve damage Gait & Station: normal, steady Patient leans: N/A   Psychiatric Specialty Exam:  Presentation  General Appearance: Appropriate for Environment; Casual  Eye Contact:Good  Speech:Clear and Coherent; Normal Rate  Speech Volume:Normal  Handedness:-- (Not assessed)   Mood and Affect  Mood:Euthymic  Affect:Congruent   Thought Process  Thought Processes:Goal Directed; Linear; Coherent  Descriptions of Associations:Intact  Orientation:Full (Time, Place and Person)  Thought Content:Delusions (Fixed, no longer distressing)  History of Schizophrenia/Schizoaffective disorder:No  Duration of Psychotic Symptoms:Greater than six months  Hallucinations:Hallucinations: None Ideas of Reference:Delusions  Suicidal Thoughts:Suicidal Thoughts: No Homicidal Thoughts:Homicidal Thoughts: No  Sensorium  Memory:Immediate Good; Recent Fair; Remote Fair  Judgment:Fair  Insight:Fair; Shallow   Executive Functions  Concentration:Good  Attention Span:Good  [place] of Knowledge:Good  Language:Good   Psychomotor Activity  Psychomotor Activity: Psychomotor Activity: Normal  Assets  Assets:Communication Skills; Desire for Improvement; Resilience; Talents/Skills   Sleep  Sleep: Sleep: Good Number of Hours of Sleep: 6.5   Physical Exam: Physical Exam Vitals and nursing note reviewed.  Constitutional:      General: He is not in acute distress.    Appearance: Normal appearance.  HENT:     Head: Normocephalic and atraumatic.  Pulmonary:      Effort: Pulmonary effort is normal.  Musculoskeletal:     Comments: R arm in fixed position. Normal ROM of L arm  Neurological:     General: No focal deficit present.     Mental Status: He is alert and oriented to person, place, and time.     Gait: Gait normal.   Review of Systems  HENT:  Negative for congestion.   Respiratory:  Negative for shortness of breath.   Cardiovascular:  Negative for chest pain.  Gastrointestinal:  Negative for abdominal pain, constipation, diarrhea, nausea and vomiting.  Genitourinary: Negative.   Neurological:  Negative for tremors and headaches.  Blood pressure 107/68, pulse 78, temperature 98 F (36.7 C), temperature source Oral, resp.  rate 16, height 5' 11"  (1.803 m), weight 64.6 kg, SpO2 98 %. Body mass index is 19.87 kg/m.   Social History   Tobacco Use  Smoking Status Some Days   Packs/day: 0.20   Years: 7.00   Pack years: 1.40   Types: Cigarettes  Smokeless Tobacco Never  Tobacco Comments   decreased smoking   Tobacco Cessation:  A prescription for an FDA-approved tobacco cessation medication provided at discharge   Blood Alcohol level:  Lab Results  Component Value Date   [facility] <10 02/22/2021   ETH <10 09/32/3557    Metabolic Disorder Labs:  Lab Results  Component Value Date   HGBA1C 4.9 02/22/2021   MPG 93.93 02/22/2021   No results found for: PROLACTIN Lab Results  Component Value Date   CHOL 166 02/22/2021   TRIG 42 02/22/2021   HDL 93 02/22/2021   CHOLHDL 1.8 02/22/2021   VLDL 8 02/22/2021   LDLCALC 65 02/22/2021    See Psychiatric Specialty Exam and Suicide Risk Assessment completed by Attending Physician prior to discharge.  Discharge destination:  Other:  Homeless shelter  Is patient on multiple antipsychotic therapies at discharge:  No   Has Patient had three or more failed trials of antipsychotic monotherapy by history:  No  Recommended Plan for Multiple Antipsychotic Therapies: NA   Allergies as of  03/10/2021       Reactions   Morphine And Related Other (See Comments)   Emesis   Ms Contin [morphine Sulfate] Nausea And Vomiting        Medication List     STOP taking these medications    busPIRone 10 MG tablet Commonly known as: BUSPAR       TAKE these medications      Indication  fluticasone 50 MCG/ACT nasal spray Commonly known as: FLONASE Place 2 sprays into both nostrils 2 (two) times daily at 8 am and 10 pm. Allergies  Indication: Allergic Rhinitis   gabapentin 400 MG capsule Commonly known as: NEURONTIN Take 1 capsule (400 mg total) by mouth 3 (three) times daily. For neuropathic pain  Indication: Neuropathic Pain   haloperidol 5 MG tablet Commonly known as: HALDOL Take 1 tablet (5 mg total) by mouth at bedtime. For mood control  Indication: Delusions   loratadine 10 MG tablet Commonly known as: CLARITIN Take 1 tablet (10 mg total) by mouth at bedtime. For allergies  Indication: Hayfever   montelukast 10 MG tablet Commonly known as: SINGULAIR Take 1 tablet (10 mg total) by mouth every morning. For allergies  Indication: Hayfever   nicotine polacrilex 2 MG gum Commonly known as: NICORETTE Take 1 each (2 mg total) by mouth as needed for smoking cessation.  Indication: Nicotine Addiction   paliperidone 234 MG/1.5ML Susy injection Commonly known as: INVEGA SUSTENNA Inject 234 mg into the muscle once for 1 dose. For mood control Start taking on: April 05, 2021  Indication: Psychosis        Follow-up Information     [facility]. Schedule an appointment as soon as possible for a visit.   Why: Please call to schedule an appointment with this provider for primary care services as soon as possible. Contact information: [address], [place] [address]        [facility] Follow up.   Why: You have an appointment scheduled on 03/18/2021 at 1:15pm for medication management and therapy. Bring  ID and social security card. Contact information: [id number]  [place],  [place] [address] Hours: Mon-Fri: 8AM to 5PM  Phone: [phone] Fax: [place]. Go to.   Why: Please arrive at shelter by 5pm. Contact information: [place], [place] [address]  Telephone: [phone]                Follow-up recommendations:  Activity:  Normal, as tolerated Diet:  Regular  Comments:  Prescriptions were given at discharge.  Patient is agreeable with the discharge  plan.  He was given an opportunity to ask questions.  He appears to feel comfortable with discharge and denies any current suicidal or homicidal thoughts.    Patient is instructed prior to discharge to: Take all medications as prescribed by his mental healthcare provider. Report any adverse effects and/or reactions from the medicines to his outpatient provider promptly. Patient has been instructed & cautioned: To not engage in alcohol and or illegal drug use while on prescription medicines.  In the event of worsening symptoms, patient is instructed to call the crisis hotline at 988, 911 and or go to the nearest ED for appropriate evaluation and treatment of symptoms. To follow-up with his primary care provider for your other medical issues, concerns and or health care needs.   Signed: Rosezetta Schlatter, MD 03/12/2021, 4:07 PM

## 2021-03-10 NOTE — Progress Notes (Signed)
Discharge Note:   Pt discharged at 14:30 left via Safe Transport set up by SW. Upon discharge pt is alert and oriented to person, place, time and situation. Pt is calm, cooperative, denies SI/HI/AVH. Pt given prescription medication upon discharge and medication education; pt verbalized understanding. PT given follow up appointments for outpt psych and instructed to follow up for medication management with provider for any questions regarding his long acting antipsychotic. Prescriptions called into pharmacy. Pt verbalized understanding of all instructions. All personal belongings returned to pt upon discharge. Pt voiced no complaints upon discharge.

## 2021-03-10 NOTE — [facility] Suicide Risk Assessment (Signed)
[facility] Discharge Suicide Risk Assessment   Principal Problem: Psychosis, 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HCC) Discharge Diagnoses: Principal Problem:   Psychosis, 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HCC) Active Problems:   Delusional disorder (HCC)   Anxious reaction   Chronic post-traumatic stress disorder (PTSD)   Marijuana use, continuous   Total Time spent with patient: 30 minutes  Musculoskeletal: Strength & Muscle Tone:  RUE paralyzed Gait & Station: normal Patient leans: N/A  Psychiatric Specialty Exam  Presentation  General Appearance: Appropriate for Environment; Casual  Eye Contact:Fleeting (Shifted gaze to each of the members of our team.)  Speech:Clear and Coherent; Normal Rate  Speech Volume:Normal  Handedness:-- (Not assessed)   Mood and Affect  Mood:Euthymic  Duration of Depression Symptoms: Greater than two weeks  Affect:Constricted   Thought Process  Thought Processes:Goal Directed  Descriptions of Associations:Intact  Orientation:Full (Time, Place and Person)  Thought Content:Delusions; Rumination  History of Schizophrenia/Schizoaffective disorder:No  Duration of Psychotic Symptoms:Greater than six months  Hallucinations:No data recorded Ideas of Reference:Paranoia; Delusions  Suicidal Thoughts:No data recorded Homicidal Thoughts:No data recorded  Sensorium  Memory:Immediate Good; Recent Fair; Remote Fair  Judgment:Impaired  Insight:Shallow   Executive Functions  Concentration:Poor  Attention Span:Poor  Recall:Good  Fund of Knowledge:Fair  Language:Fair   Psychomotor Activity  Psychomotor Activity: No data recorded  Assets  Assets:Resilience   Sleep  Sleep: No data recorded  Physical Exam: Physical Exam Vitals and nursing note reviewed.  Constitutional:      Appearance: Normal appearance.  HENT:     Head: Normocephalic.     Nose: Nose normal.  Eyes:     Extraocular Movements: Extraocular movements intact.   Cardiovascular:     Rate and Rhythm: Normal rate.  Pulmonary:     Effort: Pulmonary effort is normal.  Musculoskeletal:     Cervical back: Normal range of motion.  Skin:    General: Skin is warm.  Neurological:     Mental Status: He is alert and oriented to person, place, and time.     Comments: RUE paralysis/flaccid, chronic  Psychiatric:        Attention and Perception: Attention normal.        Mood and Affect: Mood is anxious.        Speech: Speech normal.        Behavior: Behavior normal. Behavior is cooperative.        Thought Content: Thought content is delusional. Thought content does not include homicidal or suicidal ideation.        Cognition and Memory: Cognition normal.        Judgment: Judgment normal.   ROS Blood pressure 107/68, pulse 78, temperature 98 F (36.7 C), temperature source Oral, resp. rate 16, height 5\' 11"  (1.803 m), weight 64.6 kg, SpO2 98 %. Body mass index is 19.87 kg/m.  Mental Status Per Nursing Assessment::   On Admission:  Suicidal ideation indicated by patient  Demographic Factors:  Male, Caucasian, Low socioeconomic status, and Unemployed  Loss Factors: Financial problems/change in socioeconomic status  Historical Factors: NA  Risk Reduction Factors:   NA  Continued Clinical Symptoms:  Currently Psychotic Previous Psychiatric Diagnoses and Treatments Medical Diagnoses and Treatments/Surgeries  Cognitive Features That Contribute To Risk:  Thought constriction (tunnel vision)    Suicide Risk:  Minimal: No identifiable suicidal ideation.  Patients presenting with no risk factors but with morbid ruminations; may be classified as minimal risk based on the severity of the depressive symptoms   Follow-up Information     [facility]  [facility]. Schedule an appointment as soon as possible for a visit.   Why: Please call to schedule an appointment with this provider for primary care services as soon as possible. Contact  information: [address], [address] [place] [address]        [facility] Follow up.   Why: You have an appointment scheduled on 03/18/2021 at 1:15pm for medication management and therapy. Bring ID and social security card. [address] [place]  [place] [address] Hours: Mon-Fri: 8AM to 5PM  Phone: [phone] Fax: [phone]        [facility]. Go to.   Why: Please arrive at shelter by 5pm. Contact information: [address] [place], [place] [address]  Telephone: [phone]                Plan Of Care/Follow-up recommendations:  Activity:  as tolerated Diet:  cardiac Other:    Prescriptions for new medications provided for the patient to bridge to follow up appointment. The patient was informed that refills for these prescriptions are generally not provided, and patient is encouraged to attend all follow up appointments to address medication refills and adjustments.   Today's discharge was reviewed with treatment team, and the team is in agreement that the patient is ready for discharge. The patient is was of the discharge plan for today and has been given opportunity to ask questions. At time of discharge, the patient does not vocalize any acute harm to self or others, is goal directed, able to advocate for self and organizational baseline.   At discharge, the patient is instructed to:  Take all medications as prescribed. Report any adverse effects and or reactions from the medicines to her outpatient provider promptly.  Do not engage in alcohol and/or illegal drug use while on prescription medicines.  In the event of worsening symptoms, patient is instructed to call the crisis hotline, 911 and or go to the nearest ED for appropriate evaluation and treatment of symptoms.  Follow-up with primary care provider for further care of medical issues, concerns and or health care needs.    Roselle Locus, MD 03/10/2021, 11:54 AM

## 2021-03-10 NOTE — Progress Notes (Signed)
Covid Rapid Test Results = Non-reactive for covid.

## 2021-03-10 NOTE — Progress Notes (Signed)
Recreation Therapy Notes  INPATIENT RECREATION TR PLAN  Patient Details Name: Jeffery Baldwin MRN: [id number] DOB: 11/10/1990 Today's Date: 03/10/2021  Rec Therapy Plan Is patient appropriate for Therapeutic Recreation?: Yes Treatment times per week: about 3 days Estimated Length of Stay: 5-7 days TR Treatment/Interventions: Group participation (Comment)  Discharge Criteria Pt will be discharged from therapy if:: Discharged Treatment plan/goals/alternatives discussed and agreed upon by:: Patient/family  Discharge Summary Short term goals set: See patient care plan Short term goals met: Complete Progress toward goals comments: Groups attended Which groups?: AAA/T, Stress management, Communication, Coping skills, Leisure education (Team Building) Reason goals not met: None Therapeutic equipment acquired: N/A Reason patient discharged from therapy: Discharge from hospital Pt/family agrees with progress & goals achieved: Yes Date patient discharged from therapy: 03/10/21      , LRT/CTRS ,  A 03/10/2021, 12:45 PM 

## 2021-03-10 NOTE — [facility] Group Notes (Signed)
[facility] Group Notes:  (Nursing/MHT/Case Management/Adjunct)  Date:  03/10/2021  Time:  11:35 AM  Type of Therapy:  Group Therapy  Participation Level:  Active  Participation Quality:  Appropriate  Affect:  Appropriate  Cognitive:  Alert  Insight:  Appropriate  Engagement in Group:  Engaged  Modes of Intervention:  Discussion  Summary of Progress/Problems:Pt attended goals group and participated in discussion.  Patsie Mccardle R Orlandus Borowski 03/10/2021, 11:35 AM

## 2021-03-12 NOTE — [facility] Group Notes (Signed)
ADULT GRIEF GROUP NOTE:   Spiritual care group on grief and loss facilitated by chaplain Dyanne Carrel, [facility]   Group Goal:   Support / Education around grief and loss   Members engage in facilitated group support and psycho-social education.   Group Description:   Following introductions and group rules, group members engaged in facilitated group dialog and support around topic of loss, with particular support around experiences of loss in their lives. Group Identified types of loss (relationships / self / things) and identified patterns, circumstances, and changes that precipitate losses. Reflected on thoughts / feelings around loss, normalized grief responses, and recognized variety in grief experience. Group noted Worden's four tasks of grief in discussion.   Group drew on Adlerian / Rogerian, narrative, MI,   Patient Progress: Jeffery Baldwin attended group and showed active engagement and participation.  Chaplain Dyanne Carrel, [facility] Pager, [phone] 9:49 PM

## 2021-03-13 LAB — HEAVY METALS, BLOOD
Arsenic: 1 ug/L (ref 0–9)
Lead: <1 ug/dL (ref 0–4)
Mercury: <1 ug/L (ref 0.0–14.9)

## 2021-03-15 LAB — MISC LABCORP TEST (SEND OUT): Labcorp test code: 81041

## 2021-11-18 ENCOUNTER — Emergency Department (HOSPITAL_COMMUNITY)
Admission: EM | Admit: 2021-11-18 | Discharge: 2021-11-19 | Disposition: A | Discharge status: Home / Self Care | Payer: Self-pay | Attending: Emergency Medicine | Admitting: Emergency Medicine

## 2021-11-18 ENCOUNTER — Emergency Department (HOSPITAL_COMMUNITY): Payer: Self-pay

## 2021-11-18 ENCOUNTER — Encounter (HOSPITAL_COMMUNITY): Payer: Self-pay | Admitting: Emergency Medicine

## 2021-11-18 ENCOUNTER — Other Ambulatory Visit: Payer: Self-pay

## 2021-11-18 DIAGNOSIS — Y900 Blood alcohol level of less than 20 mg/100 ml: Secondary | ICD-10-CM | POA: Insufficient documentation

## 2021-11-18 DIAGNOSIS — R410 Disorientation, unspecified: Secondary | ICD-10-CM | POA: Insufficient documentation

## 2021-11-18 DIAGNOSIS — F1999 Other psychoactive substance use, unspecified with unspecified psychoactive substance-induced disorder: Secondary | ICD-10-CM | POA: Insufficient documentation

## 2021-11-18 DIAGNOSIS — R000 Tachycardia, unspecified: Secondary | ICD-10-CM | POA: Insufficient documentation

## 2021-11-18 DIAGNOSIS — D72829 Elevated white blood cell count, unspecified: Secondary | ICD-10-CM | POA: Insufficient documentation

## 2021-11-18 DIAGNOSIS — Z79899 Other long term (current) drug therapy: Secondary | ICD-10-CM | POA: Insufficient documentation

## 2021-11-18 DIAGNOSIS — F1990 Other psychoactive substance use, unspecified, uncomplicated: Secondary | ICD-10-CM

## 2021-11-18 DIAGNOSIS — D751 Secondary polycythemia: Secondary | ICD-10-CM | POA: Insufficient documentation

## 2021-11-18 LAB — COMPREHENSIVE METABOLIC PANEL
ALT: 46 U/L — ABNORMAL HIGH (ref 0–44)
AST: 29 U/L (ref 15–41)
Albumin: 4.5 g/dL (ref 3.5–5.0)
Alkaline Phosphatase: 60 U/L (ref 38–126)
Anion gap: 12 (ref 5–15)
BUN: 16 mg/dL (ref 6–20)
CO2: 22 mmol/L (ref 22–32)
Calcium: 9.1 mg/dL (ref 8.9–10.3)
Chloride: 102 mmol/L (ref 98–111)
Creatinine, Ser: 1.02 mg/dL (ref 0.61–1.24)
GFR, Estimated: >60 mL/min (ref >60)
Glucose, Bld: 179 mg/dL — ABNORMAL HIGH (ref 70–99)
Potassium: 3.9 mmol/L (ref 3.5–5.1)
Sodium: 136 mmol/L (ref 135–145)
Total Bilirubin: 1.1 mg/dL (ref 0.3–1.2)
Total Protein: 6.8 g/dL (ref 6.5–8.1)

## 2021-11-18 LAB — CBC
HCT: 49 % (ref 39.0–52.0)
Hemoglobin: 17.3 g/dL — ABNORMAL HIGH (ref 13.0–17.0)
MCH: 32.3 pg (ref 26.0–34.0)
MCHC: 35.3 g/dL (ref 30.0–36.0)
MCV: 91.6 fL (ref 80.0–100.0)
Platelets: 169 10*3/uL (ref 150–400)
RBC: 5.35 MIL/uL (ref 4.22–5.81)
RDW: 11.8 % (ref 11.5–15.5)
WBC: 11.8 10*3/uL — ABNORMAL HIGH (ref 4.0–10.5)
nRBC: 0 % (ref 0.0–0.2)

## 2021-11-18 LAB — LIPASE, BLOOD: Lipase: 22 U/L (ref 11–51)

## 2021-11-18 MED ORDER — LACTATED RINGERS IV BOLUS
1000.0000 mL | Freq: Once | INTRAVENOUS | Status: AC
  Administered 2021-11-18: 1000 mL via INTRAVENOUS

## 2021-11-18 MED ORDER — NALOXONE HCL 0.4 MG/ML IJ SOLN
0.4000 mg | INTRAMUSCULAR | Status: DC | PRN
  Administered 2021-11-18: 0.4 mg via INTRAVENOUS
  Filled 2021-11-18: qty 1

## 2021-11-18 MED ORDER — LORAZEPAM 1 MG PO TABS
0.5000 mg | ORAL_TABLET | Freq: Once | ORAL | Status: AC
  Administered 2021-11-19: 0.5 mg via ORAL
  Filled 2021-11-18: qty 1
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
## 2021-11-18 NOTE — ED Notes (Signed)
Patient transported to CT scan . 
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
## 2021-11-18 NOTE — ED Provider Notes (Incomplete)
[facility] EMERGENCY DEPARTMENT Provider Note   CSN: [id number] Arrival date & time: 11/18/21  2058     History {Add pertinent medical, surgical, social history, OB history to HPI:1} Chief Complaint  Patient presents with  . Possible OD     Jeffery Baldwin is a 31 y.o. male.  HPI        Home Medications Prior to Admission medications   Medication Sig Start Date End Date Taking? Authorizing Provider  fluticasone (FLONASE) 50 MCG/ACT nasal spray Place 2 sprays into both nostrils 2 (two) times daily at 8 am and 10 pm. Allergies 03/10/21   Armandina Stammer I, NP  gabapentin (NEURONTIN) 400 MG capsule Take 1 capsule (400 mg total) by mouth 3 (three) times daily. For neuropathic pain 03/10/21   Armandina Stammer I, NP  haloperidol (HALDOL) 5 MG tablet Take 1 tablet (5 mg total) by mouth at bedtime. For mood control 03/10/21   Armandina Stammer I, NP  loratadine (CLARITIN) 10 MG tablet Take 1 tablet (10 mg total) by mouth at bedtime. For allergies 03/10/21   Armandina Stammer I, NP  montelukast (SINGULAIR) 10 MG tablet Take 1 tablet (10 mg total) by mouth every morning. For allergies 03/11/21   Armandina Stammer I, NP  nicotine polacrilex (NICORETTE) 2 MG gum Take 1 each (2 mg total) by mouth as needed for smoking cessation. 03/10/21   Roselle Locus, MD  paliperidone (INVEGA SUSTENNA) 234 MG/1.5ML SUSY injection Inject 234 mg into the muscle once for 1 dose. For mood control 04/05/21 04/05/21  Armandina Stammer I, NP  amitriptyline (ELAVIL) 50 MG tablet Take 1 tablet (50 mg total) by mouth at bedtime. Patient not taking: Reported on 11/13/2014 06/10/14 09/01/15  Narda Bonds, MD  atomoxetine (STRATTERA) 40 MG capsule Take 1 capsule (40 mg total) by mouth daily. 02/27/20 11/15/20  Shanna Cisco, NP  Calcium Carb-Cholecalciferol (CALCIUM PLUS VITAMIN D3) 600-500 MG-UNIT CAPS Take 1 capsule by mouth daily. Patient not taking: Reported on 08/29/2015 04/10/14 09/01/15  Narda Bonds, MD       Allergies    Morphine and related and Ms contin [morphine sulfate]    Review of Systems   Review of Systems  Physical Exam Updated Vital Signs BP (!) 134/103   Pulse 96   Temp 98.6 F (37 C) (Oral)   Resp 13   SpO2 91%  Physical Exam  ED Results / Procedures / Treatments   Labs (all labs ordered are listed, but only abnormal results are displayed) Labs Reviewed  COMPREHENSIVE METABOLIC PANEL - Abnormal; Notable for the following components:      Result Value   Glucose, Bld 179 (*)    ALT 46 (*)    All other components within normal limits  CBC - Abnormal; Notable for the following components:   WBC 11.8 (*)    Hemoglobin 17.3 (*)    All other components within normal limits  LIPASE, BLOOD  RAPID URINE DRUG SCREEN, HOSP PERFORMED  URINALYSIS, ROUTINE W REFLEX MICROSCOPIC    EKG None  Radiology DG Abdomen Acute W/Chest  Result Date: 11/18/2021 CLINICAL DATA:  Altered mental status. Recent drug use. EXAM: DG ABDOMEN ACUTE WITH 1 VIEW CHEST COMPARISON:  Chest radiograph 11/13/2014 FINDINGS: The cardiomediastinal contours are normal. The lungs are clear. No focal airspace disease or pleural effusion there is no free intra-abdominal air. No dilated bowel loops to suggest obstruction. Small volume of stool in the ascending colon. There is gaseous distension of  distal transverse colon and splenic flexure. Left pelvic phlebolith. No radiopaque calculi. No acute osseous abnormalities are seen. IMPRESSION: 1. Gaseous distension of distal transverse colon and splenic flexure, possible mild colonic ileus. No obstruction or free air. 2. Clear lungs. Electronically Signed   By: Narda Rutherford M.D.   On: 11/18/2021 23:42   CT HEAD WO CONTRAST ( )  Result Date: 11/18/2021 CLINICAL DATA:  Delirium EXAM: CT HEAD WITHOUT CONTRAST TECHNIQUE: Contiguous axial images were obtained from the base of the skull through the vertex without intravenous contrast. RADIATION DOSE REDUCTION: This  exam was performed according to the departmental dose-optimization program which includes automated exposure control, adjustment of the mA and/or kV according to patient size and/or use of iterative reconstruction technique. COMPARISON:  None Available. FINDINGS: Brain: Normal anatomic configuration. No abnormal intra or extra-axial mass lesion or fluid collection. No abnormal mass effect or midline shift. No evidence of acute intracranial hemorrhage or infarct. Ventricular size is normal. Cerebellum unremarkable. Vascular: Unremarkable Skull: Intact Sinuses/Orbits: Paranasal sinuses are clear. Orbits are unremarkable. Other: Mastoid air cells and middle ear cavities are clear. IMPRESSION: No acute intracranial abnormality. Electronically Signed   By: Helyn Numbers M.D.   On: 11/18/2021 23:27    Procedures Procedures  {Document cardiac monitor, telemetry assessment procedure when appropriate:1}  Medications Ordered in ED Medications  naloxone [facility]) injection 0.4 mg (0.4 mg Intravenous Given 11/18/21 2157)  LORazepam (ATIVAN) tablet 0.5 mg (has no administration in time range)  lactated ringers bolus 1,000 mL (0 mLs Intravenous Stopped 11/18/21 2254)    ED Course/ Medical Decision Making/ A&P Clinical Course as of 11/18/21 2344  Fri Nov 18, 2021  2339 IMPRESSION: No acute intracranial abnormality.   Electronically Signed   By: Helyn Numbers M.D.   On: 11/18/2021 23:27   [WF]    Clinical Course User Index [WF] Gailen Shelter, PA                           Medical Decision Making Amount and/or Complexity of Data Reviewed Labs: ordered. Radiology: ordered.  Risk Prescription drug management.   ***  {Document critical care time when appropriate:1} {Document review of labs and clinical decision tools ie heart score, Chads2Vasc2 etc:1}  {Document your independent review of radiology images, and any outside records:1} {Document your discussion with family members, caretakers,  and with consultants:1} {Document social determinants of health affecting pt's care:1} {Document your decision making why or why not admission, treatments were needed:1} Final Clinical Impression(s) / ED Diagnoses Final diagnoses:  None    Rx / DC Orders ED Discharge Orders     None

## 2021-11-19 LAB — SALICYLATE LEVEL: Salicylate Lvl: <7 mg/dL — ABNORMAL LOW (ref 7.0–30.0)

## 2021-11-19 LAB — ACETAMINOPHEN LEVEL: Acetaminophen (Tylenol), Serum: <10 ug/mL — ABNORMAL LOW (ref 10–30)

## 2021-11-19 LAB — ETHANOL: Alcohol, Ethyl (B): <10 mg/dL (ref <10)

## 2021-11-19 MED ORDER — SODIUM CHLORIDE 0.9 % IV BOLUS
1000.0000 mL | Freq: Once | INTRAVENOUS | Status: AC
  Administered 2021-11-19: 1000 mL via INTRAVENOUS
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
## 2021-11-19 NOTE — ED Notes (Signed)
Patient unable to give urine specimen at this time , urinal at bedside .
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
## 2021-11-19 NOTE — ED Notes (Signed)
Pt let this tech perform the bladder scan, most urine measured was .

## 2021-11-19 NOTE — ED Notes (Signed)
Patient assisted multiple times by RN to use a urinal to urinate but unable to give specimen , refuse foley catheter and bladder scanner , PA notified .

## 2021-11-19 NOTE — Discharge Instructions (Addendum)
You came to the emergency department with concerns for drug overdose.  Your physical exam and lab results were reassuring.  Please use the resources on this paperwork to follow-up with drug and alcohol counseling.  Please do not use any illegal street drugs as this is bad for your health and can cause serious life-threatening complications.  Get help right away if: You or someone else is having symptoms of an opioid overdose. Get help even if you are not sure. You have thoughts about hurting yourself or others. You have: Chest pain. Difficulty breathing. A loss of consciousness.

## 2021-12-03 DEATH — deceased
